# Patient Record
Sex: Female | Born: 1950 | Race: White | Hispanic: No | Marital: Married | Smoking: Former smoker
Health system: Southern US, Community
[De-identification: ages and names within clinical notes are randomized; demographics above are authoritative.]

## PROBLEM LIST (undated history)

## (undated) DIAGNOSIS — F4310 Post-traumatic stress disorder, unspecified: Secondary | ICD-10-CM

## (undated) DIAGNOSIS — IMO0001 Reserved for inherently not codable concepts without codable children: Secondary | ICD-10-CM

## (undated) DIAGNOSIS — G8929 Other chronic pain: Secondary | ICD-10-CM

## (undated) DIAGNOSIS — R030 Elevated blood-pressure reading, without diagnosis of hypertension: Secondary | ICD-10-CM

## (undated) DIAGNOSIS — F419 Anxiety disorder, unspecified: Secondary | ICD-10-CM

## (undated) DIAGNOSIS — Z8719 Personal history of other diseases of the digestive system: Secondary | ICD-10-CM

## (undated) DIAGNOSIS — F32A Depression, unspecified: Secondary | ICD-10-CM

## (undated) DIAGNOSIS — K219 Gastro-esophageal reflux disease without esophagitis: Secondary | ICD-10-CM

## (undated) DIAGNOSIS — Z9889 Other specified postprocedural states: Secondary | ICD-10-CM

## (undated) DIAGNOSIS — M1990 Unspecified osteoarthritis, unspecified site: Secondary | ICD-10-CM

## (undated) DIAGNOSIS — Z531 Procedure and treatment not carried out because of patient's decision for reasons of belief and group pressure: Secondary | ICD-10-CM

## (undated) DIAGNOSIS — G709 Myoneural disorder, unspecified: Secondary | ICD-10-CM

## (undated) HISTORY — DX: Anxiety disorder, unspecified: F41.9

## (undated) HISTORY — DX: Myoneural disorder, unspecified: G70.9

## (undated) HISTORY — PX: ABDOMINAL HYSTERECTOMY: SHX81

## (undated) HISTORY — DX: Gastro-esophageal reflux disease without esophagitis: K21.9

## (undated) HISTORY — DX: Unspecified osteoarthritis, unspecified site: M19.90

## (undated) HISTORY — PX: SPINE SURGERY: SHX786

## (undated) HISTORY — PX: TONSILLECTOMY: SUR1361

## (undated) HISTORY — PX: COLONOSCOPY: SHX174

## (undated) HISTORY — DX: Depression, unspecified: F32.A

## (undated) HISTORY — PX: CHOLECYSTECTOMY: SHX55

---
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
## 1989-12-14 HISTORY — PX: LUMBAR DISC SURGERY: SHX700

## 2018-12-09 ENCOUNTER — Encounter: Payer: Self-pay | Admitting: Family Medicine

## 2018-12-09 LAB — HM DEXA SCAN

## 2019-11-06 LAB — HM MAMMOGRAPHY

## 2019-12-06 LAB — POCT ERYTHROCYTE SEDIMENTATION RATE, NON-AUTOMATED: Sed Rate: 6

## 2020-05-21 LAB — HEPATIC FUNCTION PANEL
ALT: 22 (ref 7–35)
AST: 31 (ref 13–35)
Alkaline Phosphatase: 71 (ref 25–125)
Bilirubin, Total: 0.9

## 2020-05-21 LAB — BASIC METABOLIC PANEL
BUN: 11 (ref 4–21)
CO2: 33 — AB (ref 13–22)
Chloride: 104 (ref 99–108)
Creatinine: 0.6 (ref 0.5–1.1)
Glucose: 95
Potassium: 4.9 (ref 3.4–5.3)
Sodium: 142 (ref 137–147)

## 2020-05-21 LAB — COMPREHENSIVE METABOLIC PANEL
Albumin: 4.2 (ref 3.5–5.0)
Calcium: 9.1 (ref 8.7–10.7)
Globulin: 2.1

## 2020-05-21 LAB — CBC AND DIFFERENTIAL
HCT: 41 (ref 36–46)
Hemoglobin: 13.3 (ref 12.0–16.0)
Platelets: 222 (ref 150–399)
WBC: 3.7

## 2020-05-21 LAB — CBC: RBC: 4.51 (ref 3.87–5.11)

## 2020-05-21 LAB — MICROALBUMIN, URINE: Microalb, Ur: 0.2

## 2020-05-21 LAB — TSH: TSH: 1.81 (ref 0.41–5.90)

## 2020-05-24 LAB — LIPID PANEL
Cholesterol: 196 (ref 0–200)
HDL: 63 (ref 35–70)
LDL Cholesterol: 112
LDl/HDL Ratio: 3.1
Triglycerides: 99 (ref 40–160)

## 2020-07-22 DIAGNOSIS — M5412 Radiculopathy, cervical region: Secondary | ICD-10-CM | POA: Diagnosis not present

## 2020-07-22 DIAGNOSIS — M5416 Radiculopathy, lumbar region: Secondary | ICD-10-CM | POA: Diagnosis not present

## 2020-07-22 DIAGNOSIS — M25552 Pain in left hip: Secondary | ICD-10-CM | POA: Diagnosis not present

## 2020-07-22 DIAGNOSIS — M5417 Radiculopathy, lumbosacral region: Secondary | ICD-10-CM | POA: Diagnosis not present

## 2020-07-22 DIAGNOSIS — M9903 Segmental and somatic dysfunction of lumbar region: Secondary | ICD-10-CM | POA: Diagnosis not present

## 2020-07-22 DIAGNOSIS — M961 Postlaminectomy syndrome, not elsewhere classified: Secondary | ICD-10-CM | POA: Diagnosis not present

## 2020-07-22 DIAGNOSIS — M62838 Other muscle spasm: Secondary | ICD-10-CM | POA: Diagnosis not present

## 2020-07-22 DIAGNOSIS — M9901 Segmental and somatic dysfunction of cervical region: Secondary | ICD-10-CM | POA: Diagnosis not present

## 2020-07-22 DIAGNOSIS — M542 Cervicalgia: Secondary | ICD-10-CM | POA: Diagnosis not present

## 2020-07-22 DIAGNOSIS — M9902 Segmental and somatic dysfunction of thoracic region: Secondary | ICD-10-CM | POA: Diagnosis not present

## 2020-07-23 ENCOUNTER — Ambulatory Visit: Payer: Self-pay

## 2020-07-23 DIAGNOSIS — S52124A Nondisplaced fracture of head of right radius, initial encounter for closed fracture: Secondary | ICD-10-CM | POA: Diagnosis not present

## 2020-07-25 ENCOUNTER — Encounter: Payer: Self-pay | Admitting: Family Medicine

## 2020-07-25 ENCOUNTER — Other Ambulatory Visit: Payer: Self-pay

## 2020-07-25 ENCOUNTER — Ambulatory Visit (INDEPENDENT_AMBULATORY_CARE_PROVIDER_SITE_OTHER): Payer: Medicare HMO | Admitting: Family Medicine

## 2020-07-25 ENCOUNTER — Ambulatory Visit (INDEPENDENT_AMBULATORY_CARE_PROVIDER_SITE_OTHER)
Admission: RE | Admit: 2020-07-25 | Discharge: 2020-07-25 | Disposition: A | Discharge status: Home / Self Care | Payer: Medicare HMO | Source: Ambulatory Visit | Attending: Family Medicine | Admitting: Family Medicine

## 2020-07-25 VITALS — BP 120/78 | HR 69 | Temp 97.0°F | Ht 68.0 in | Wt 212.0 lb

## 2020-07-25 DIAGNOSIS — G894 Chronic pain syndrome: Secondary | ICD-10-CM | POA: Insufficient documentation

## 2020-07-25 DIAGNOSIS — H9190 Unspecified hearing loss, unspecified ear: Secondary | ICD-10-CM | POA: Diagnosis not present

## 2020-07-25 DIAGNOSIS — M5417 Radiculopathy, lumbosacral region: Secondary | ICD-10-CM | POA: Diagnosis not present

## 2020-07-25 DIAGNOSIS — G8929 Other chronic pain: Secondary | ICD-10-CM | POA: Diagnosis not present

## 2020-07-25 DIAGNOSIS — K5909 Other constipation: Secondary | ICD-10-CM

## 2020-07-25 DIAGNOSIS — G4700 Insomnia, unspecified: Secondary | ICD-10-CM

## 2020-07-25 DIAGNOSIS — M549 Dorsalgia, unspecified: Secondary | ICD-10-CM | POA: Insufficient documentation

## 2020-07-25 DIAGNOSIS — M542 Cervicalgia: Secondary | ICD-10-CM | POA: Insufficient documentation

## 2020-07-25 DIAGNOSIS — M0600 Rheumatoid arthritis without rheumatoid factor, unspecified site: Secondary | ICD-10-CM | POA: Diagnosis not present

## 2020-07-25 DIAGNOSIS — K5900 Constipation, unspecified: Secondary | ICD-10-CM | POA: Diagnosis not present

## 2020-07-25 DIAGNOSIS — M47816 Spondylosis without myelopathy or radiculopathy, lumbar region: Secondary | ICD-10-CM | POA: Diagnosis not present

## 2020-07-25 NOTE — Assessment & Plan Note (Signed)
Was followed by pain specialist in [place] and would like referral for continued care an management. Per report is on MS Contin and percocet. Will defer continued management to pain medicine and obtain outside records.

## 2020-07-25 NOTE — Assessment & Plan Note (Signed)
Pt notes "firm spot" which she cannot always feel. Suspect this may be stool though unable to palpate on exam. KUB with moderate stool on the right side. Hand out for constipation treatment. Will f/u final read. Consider additional imaging if persisting after constipation resolves.

## 2020-07-25 NOTE — Assessment & Plan Note (Signed)
Cont prn ambien. Gets 1 week periods and then sleeps well for several weeks.

## 2020-07-25 NOTE — Patient Instructions (Addendum)
#  Referral I have placed a referral to a specialist for you. You should receive a phone call from the specialty office. Make sure your voicemail is not full and that if you are able to answer your phone to unknown or new numbers.   It may take up to 2 weeks to hear about the referral. If you do not hear anything in 2 weeks, please call our office and ask to speak with the referral coordinator.     Constipation   Constipation is a common issue. Often it is related to diet and occasionally medications.    What you can do to treat your symptoms 1) Fiber -- Eat more fiber rich foods: beans, broccoli, berries, avocados, popcorn, pear/apple, green peas, turnip greens, brussels sprouts, whole grains (barley, bran, quinoa, oatmeal) -- Take a Fiber supplement: Psyllium (Metamucil)  -- Could also eat Prunes daily  2) Hydration  -- Drink more water: Try to drink 64 oz of water per day  3) Exercise -- Moderate exercise (walking, jogging, biking) for 30 minutes, 5 days a week  4) Dedicate time for Bowel movements - do not delay  5) Stool Softener  - Docusate Sodium (Colace) 100 mg daily or twice daily as needed   If 4-6 weeks have passed and the above has not helped then start the following 6) Laxatives -- Polyethylene Glycol (Miralax) - begin with once daily. After a few days can increase to twice daily Or -- Magnesium Citrate -- Common side effect is nausea and diarrhea -- can try if still not improved  If still not improved after 4-6 weeks 7) Stimulant Laxatives: can also be an option if doing above treatment and no bowel movement for 3 days --- Bisacodyl (Dulcolax) 5-15 mg daily --- Sennosides (Senokot)  Treating chronic constipation is often about finding the right amount of medication and fiber to keep you regular and comfortable. For some people that may be daily metamucil and colace every other day. For others it may be Metamucil and colace twice daily and Miralax 3 times a week.  The goal is to go slow and listen to your body. And normal can be anywhere from 2-3 soft bowel movements a day to 1 bowel movement every 2-3 days.

## 2020-07-25 NOTE — Progress Notes (Signed)
Subjective:     Emily Landry is a 69 y.o. female presenting for Establish Care and Referral (pain management, Gastro, rheumatologist )     HPI   #abdominal issue - chiropractor recommended her seeing GI - firm area on the abdomen - remains in the same spot - had a endoscopy - present x 1 years - no imaging  - endorses pain occasionally in the area - previously diagnosed with diverticulosis - chronic constipation    Review of Systems   Social History   Tobacco Use  Smoking Status Never Smoker  Smokeless Tobacco Never Used        Objective:    BP Readings from Last 3 Encounters:  07/25/20 120/78   Wt Readings from Last 3 Encounters:  07/25/20 212 lb (96.2 kg)    BP 120/78   Pulse 69   Temp (!) 97 F (36.1 C) (Temporal)   Ht 5\' 8"  (1.727 m)   Wt 212 lb (96.2 kg)   SpO2 95%   BMI 32.23 kg/m    Physical Exam Constitutional:      General: She is not in acute distress.    Appearance: She is well-developed. She is not diaphoretic.  HENT:     Right Ear: External ear normal.     Left Ear: External ear normal.     Nose: Nose normal.  Eyes:     Conjunctiva/sclera: Conjunctivae normal.  Cardiovascular:     Rate and Rhythm: Normal rate and regular rhythm.     Heart sounds: No murmur heard.   Pulmonary:     Effort: Pulmonary effort is normal. No respiratory distress.     Breath sounds: Normal breath sounds. No wheezing.  Abdominal:     General: Abdomen is flat. Bowel sounds are normal. There is no distension.     Palpations: There is no mass.     Tenderness: There is no abdominal tenderness. There is no guarding or rebound.     Hernia: No hernia is present.  Musculoskeletal:     Cervical back: Neck supple.     Comments: Right arm in sling  Skin:    General: Skin is warm and dry.     Capillary Refill: Capillary refill takes less than 2 seconds.  Neurological:     Mental Status: She is alert. Mental status is at baseline.  Psychiatric:         Mood and Affect: Mood normal.        Behavior: Behavior normal.     Abdominal XR: moderate stool present on the right abdomen      Assessment & Plan:   Problem List Items Addressed This Visit      Digestive   Chronic constipation    Pt notes "firm spot" which she cannot always feel. Suspect this may be stool though unable to palpate on exam. KUB with moderate stool on the right side. Hand out for constipation treatment. Will f/u final read. Consider additional imaging if persisting after constipation resolves.       Relevant Orders   DG Abd 1 View     Musculoskeletal and Integument   Seronegative rheumatoid arthritis (HCC)    Referral to Rheum for continued management. Will request records.       Relevant Medications   morphine (MS CONTIN) 15 MG 12 hr tablet   predniSONE (DELTASONE) 5 MG tablet   oxyCODONE-acetaminophen (PERCOCET) 10-325 MG tablet   Other Relevant Orders   Ambulatory referral to Rheumatology  Other   Insomnia    Cont prn ambien. Gets 1 week periods and then sleeps well for several weeks.       Hearing difficulty    Has hearing aid, but feels this is not working as well. Referral to audiology for assessment      Relevant Orders   Ambulatory referral to Audiology   Chronic pain disorder - Primary    Was followed by pain specialist in [place] and would like referral for continued care an management. Per report is on MS Contin and percocet. Will defer continued management to pain medicine and obtain outside records.       Relevant Medications   morphine (MS CONTIN) 15 MG 12 hr tablet   predniSONE (DELTASONE) 5 MG tablet   gabapentin (NEURONTIN) 100 MG capsule   oxyCODONE-acetaminophen (PERCOCET) 10-325 MG tablet   DULoxetine (CYMBALTA) 60 MG capsule   Other Relevant Orders   Ambulatory referral to Pain Clinic       Return in about 4 months (around 11/24/2020).  Lynnda Child, MD  This visit occurred during the SARS-CoV-2 public health  emergency.  Safety protocols were in place, including screening questions prior to the visit, additional usage of staff PPE, and extensive cleaning of exam room while observing appropriate contact time as indicated for disinfecting solutions.

## 2020-07-25 NOTE — Assessment & Plan Note (Signed)
Has hearing aid, but feels this is not working as well. Referral to audiology for assessment

## 2020-07-25 NOTE — Assessment & Plan Note (Signed)
Referral to Rheum for continued management. Will request records.

## 2020-07-30 ENCOUNTER — Telehealth: Payer: Self-pay | Admitting: Family Medicine

## 2020-07-30 NOTE — Telephone Encounter (Signed)
Rec'd from [facility] forwarded 99 pages to Dr. Gweneth Dimitri

## 2020-08-01 ENCOUNTER — Encounter: Payer: Self-pay | Admitting: Family Medicine

## 2020-08-01 DIAGNOSIS — M8580 Other specified disorders of bone density and structure, unspecified site: Secondary | ICD-10-CM

## 2020-08-01 DIAGNOSIS — K219 Gastro-esophageal reflux disease without esophagitis: Secondary | ICD-10-CM | POA: Insufficient documentation

## 2020-08-01 DIAGNOSIS — F325 Major depressive disorder, single episode, in full remission: Secondary | ICD-10-CM | POA: Insufficient documentation

## 2020-08-01 HISTORY — DX: Other specified disorders of bone density and structure, unspecified site: M85.80

## 2020-08-02 ENCOUNTER — Encounter: Payer: Self-pay | Admitting: Family Medicine

## 2020-08-02 DIAGNOSIS — S52124A Nondisplaced fracture of head of right radius, initial encounter for closed fracture: Secondary | ICD-10-CM | POA: Diagnosis not present

## 2020-08-05 DIAGNOSIS — M25552 Pain in left hip: Secondary | ICD-10-CM | POA: Diagnosis not present

## 2020-08-05 DIAGNOSIS — M9902 Segmental and somatic dysfunction of thoracic region: Secondary | ICD-10-CM | POA: Diagnosis not present

## 2020-08-05 DIAGNOSIS — M9901 Segmental and somatic dysfunction of cervical region: Secondary | ICD-10-CM | POA: Diagnosis not present

## 2020-08-05 DIAGNOSIS — M542 Cervicalgia: Secondary | ICD-10-CM | POA: Diagnosis not present

## 2020-08-05 DIAGNOSIS — M9903 Segmental and somatic dysfunction of lumbar region: Secondary | ICD-10-CM | POA: Diagnosis not present

## 2020-08-05 DIAGNOSIS — M62838 Other muscle spasm: Secondary | ICD-10-CM | POA: Diagnosis not present

## 2020-08-05 DIAGNOSIS — M5416 Radiculopathy, lumbar region: Secondary | ICD-10-CM | POA: Diagnosis not present

## 2020-08-06 ENCOUNTER — Encounter: Payer: Self-pay | Admitting: Family Medicine

## 2020-08-07 ENCOUNTER — Encounter: Payer: Self-pay | Admitting: Family Medicine

## 2020-08-07 DIAGNOSIS — Z03818 Encounter for observation for suspected exposure to other biological agents ruled out: Secondary | ICD-10-CM | POA: Diagnosis not present

## 2020-08-07 DIAGNOSIS — Z1152 Encounter for screening for COVID-19: Secondary | ICD-10-CM | POA: Diagnosis not present

## 2020-08-07 DIAGNOSIS — U071 COVID-19: Secondary | ICD-10-CM | POA: Diagnosis not present

## 2020-08-20 DIAGNOSIS — M5412 Radiculopathy, cervical region: Secondary | ICD-10-CM | POA: Diagnosis not present

## 2020-08-20 DIAGNOSIS — M5417 Radiculopathy, lumbosacral region: Secondary | ICD-10-CM | POA: Diagnosis not present

## 2020-08-20 DIAGNOSIS — M961 Postlaminectomy syndrome, not elsewhere classified: Secondary | ICD-10-CM | POA: Diagnosis not present

## 2020-08-26 DIAGNOSIS — H903 Sensorineural hearing loss, bilateral: Secondary | ICD-10-CM | POA: Diagnosis not present

## 2020-08-28 DIAGNOSIS — M62838 Other muscle spasm: Secondary | ICD-10-CM | POA: Diagnosis not present

## 2020-08-28 DIAGNOSIS — M5416 Radiculopathy, lumbar region: Secondary | ICD-10-CM | POA: Diagnosis not present

## 2020-08-28 DIAGNOSIS — M9902 Segmental and somatic dysfunction of thoracic region: Secondary | ICD-10-CM | POA: Diagnosis not present

## 2020-08-28 DIAGNOSIS — M542 Cervicalgia: Secondary | ICD-10-CM | POA: Diagnosis not present

## 2020-08-28 DIAGNOSIS — M25552 Pain in left hip: Secondary | ICD-10-CM | POA: Diagnosis not present

## 2020-08-28 DIAGNOSIS — M9901 Segmental and somatic dysfunction of cervical region: Secondary | ICD-10-CM | POA: Diagnosis not present

## 2020-08-28 DIAGNOSIS — M9903 Segmental and somatic dysfunction of lumbar region: Secondary | ICD-10-CM | POA: Diagnosis not present

## 2020-08-29 DIAGNOSIS — H903 Sensorineural hearing loss, bilateral: Secondary | ICD-10-CM | POA: Diagnosis not present

## 2020-09-02 DIAGNOSIS — M069 Rheumatoid arthritis, unspecified: Secondary | ICD-10-CM | POA: Diagnosis not present

## 2020-09-02 DIAGNOSIS — M19042 Primary osteoarthritis, left hand: Secondary | ICD-10-CM | POA: Diagnosis not present

## 2020-09-02 DIAGNOSIS — M19041 Primary osteoarthritis, right hand: Secondary | ICD-10-CM | POA: Insufficient documentation

## 2020-09-02 DIAGNOSIS — M19071 Primary osteoarthritis, right ankle and foot: Secondary | ICD-10-CM | POA: Diagnosis not present

## 2020-09-02 DIAGNOSIS — Z111 Encounter for screening for respiratory tuberculosis: Secondary | ICD-10-CM | POA: Diagnosis not present

## 2020-09-02 DIAGNOSIS — Z79899 Other long term (current) drug therapy: Secondary | ICD-10-CM | POA: Diagnosis not present

## 2020-09-03 ENCOUNTER — Encounter: Payer: Self-pay | Admitting: Family Medicine

## 2020-09-11 DIAGNOSIS — M9901 Segmental and somatic dysfunction of cervical region: Secondary | ICD-10-CM | POA: Diagnosis not present

## 2020-09-11 DIAGNOSIS — M9902 Segmental and somatic dysfunction of thoracic region: Secondary | ICD-10-CM | POA: Diagnosis not present

## 2020-09-11 DIAGNOSIS — M9903 Segmental and somatic dysfunction of lumbar region: Secondary | ICD-10-CM | POA: Diagnosis not present

## 2020-09-11 DIAGNOSIS — M62838 Other muscle spasm: Secondary | ICD-10-CM | POA: Diagnosis not present

## 2020-09-11 DIAGNOSIS — M5416 Radiculopathy, lumbar region: Secondary | ICD-10-CM | POA: Diagnosis not present

## 2020-09-11 DIAGNOSIS — M25552 Pain in left hip: Secondary | ICD-10-CM | POA: Diagnosis not present

## 2020-09-11 DIAGNOSIS — M542 Cervicalgia: Secondary | ICD-10-CM | POA: Diagnosis not present

## 2020-09-16 ENCOUNTER — Encounter: Payer: Self-pay | Admitting: Family Medicine

## 2020-09-16 DIAGNOSIS — G894 Chronic pain syndrome: Secondary | ICD-10-CM

## 2020-09-16 NOTE — Telephone Encounter (Signed)
Patient has appointment with her doctor in [place] tomorrow. They won't be able to see her without a referral.  She sees Dr. Armida Sans- phone number- [phone].

## 2020-09-25 DIAGNOSIS — M5416 Radiculopathy, lumbar region: Secondary | ICD-10-CM | POA: Diagnosis not present

## 2020-09-25 DIAGNOSIS — M542 Cervicalgia: Secondary | ICD-10-CM | POA: Diagnosis not present

## 2020-09-25 DIAGNOSIS — M9901 Segmental and somatic dysfunction of cervical region: Secondary | ICD-10-CM | POA: Diagnosis not present

## 2020-09-25 DIAGNOSIS — M9903 Segmental and somatic dysfunction of lumbar region: Secondary | ICD-10-CM | POA: Diagnosis not present

## 2020-09-25 DIAGNOSIS — M62838 Other muscle spasm: Secondary | ICD-10-CM | POA: Diagnosis not present

## 2020-09-25 DIAGNOSIS — M25552 Pain in left hip: Secondary | ICD-10-CM | POA: Diagnosis not present

## 2020-09-25 DIAGNOSIS — M9902 Segmental and somatic dysfunction of thoracic region: Secondary | ICD-10-CM | POA: Diagnosis not present

## 2020-10-03 ENCOUNTER — Encounter: Payer: Self-pay | Admitting: Student in an Organized Health Care Education/Training Program

## 2020-10-03 ENCOUNTER — Other Ambulatory Visit: Payer: Self-pay

## 2020-10-03 ENCOUNTER — Ambulatory Visit
Payer: Medicare HMO | Attending: Student in an Organized Health Care Education/Training Program | Admitting: Student in an Organized Health Care Education/Training Program

## 2020-10-03 VITALS — BP 159/78 | HR 63 | Temp 97.2°F | Resp 18 | Ht 69.0 in | Wt 208.0 lb

## 2020-10-03 DIAGNOSIS — M5416 Radiculopathy, lumbar region: Secondary | ICD-10-CM | POA: Diagnosis not present

## 2020-10-03 DIAGNOSIS — G894 Chronic pain syndrome: Secondary | ICD-10-CM | POA: Insufficient documentation

## 2020-10-03 DIAGNOSIS — M25511 Pain in right shoulder: Secondary | ICD-10-CM | POA: Diagnosis not present

## 2020-10-03 DIAGNOSIS — M25512 Pain in left shoulder: Secondary | ICD-10-CM | POA: Insufficient documentation

## 2020-10-03 DIAGNOSIS — M5136 Other intervertebral disc degeneration, lumbar region: Secondary | ICD-10-CM | POA: Insufficient documentation

## 2020-10-03 DIAGNOSIS — G8929 Other chronic pain: Secondary | ICD-10-CM | POA: Insufficient documentation

## 2020-10-03 DIAGNOSIS — M961 Postlaminectomy syndrome, not elsewhere classified: Secondary | ICD-10-CM | POA: Diagnosis not present

## 2020-10-03 DIAGNOSIS — Z79891 Long term (current) use of opiate analgesic: Secondary | ICD-10-CM | POA: Insufficient documentation

## 2020-10-03 NOTE — Addendum Note (Signed)
Addended by: Edward Jolly on: 10/03/2020 03:04 PM   Modules accepted: Orders

## 2020-10-03 NOTE — Progress Notes (Signed)
Safety precautions to be maintained throughout the outpatient stay will include: orient to surroundings, keep bed in low position, maintain call bell within reach at all times, provide assistance with transfer out of bed and ambulation.  

## 2020-10-03 NOTE — Progress Notes (Signed)
Patient: Emily Landry  Service Category: E/M  Provider: Gillis Santa, MD  DOB: 1950-12-19  DOS: 10/03/2020  Referring Provider: Lesleigh Noe, MD  MRN: [id number]  Setting: Ambulatory outpatient  PCP: Lesleigh Noe, MD  Type: New Patient  Specialty: Interventional Pain Management    Location: Office  Delivery: Face-to-face     Primary Reason(s) for Visit: Encounter for initial evaluation of one or more chronic problems (new to examiner) potentially causing chronic pain, and posing a threat to normal musculoskeletal function. (Level of risk: High) CC: Back Pain (low back, coccyx) and Shoulder Pain (bilateral, right is worse)  HPI  Emily Landry is a 69 y.o. year old, female patient, who comes for the first time to our practice referred by Lesleigh Noe, MD for our initial evaluation of her chronic pain. She has Seronegative rheumatoid arthritis ([place]); Insomnia; Hearing difficulty; Chronic pain syndrome; Chronic back pain; Chronic neck pain; Chronic constipation; Osteopenia; Major depressive disorder, single episode, in remission ([place]); GERD (gastroesophageal reflux disease); Lumbar post-laminectomy syndrome; Lumbar radiculopathy; Lumbar degenerative disc disease; and Encounter for long-term opiate analgesic use on their problem list. Today she comes in for evaluation of her Back Pain (low back, coccyx) and Shoulder Pain (bilateral, right is worse)  Pain Assessment: Location: Lower Back Radiating: radiates into left leg to heel in side and front Onset: More than a month ago Duration: Chronic pain Quality: Dull, Throbbing Severity: 6 /10 (subjective, self-reported pain score)  Effect on ADL: Limits activities Timing: Constant Modifying factors: lying down BP: (!) 159/78  HR: 63  Onset and Duration: Present longer than 3 months Cause of pain: Unknown Severity: NAS-11 at its worse: 9/10, NAS-11 at its best: 6/10, NAS-11 now: 6/10 and NAS-11 on the average: 7/10 Timing: Morning, Night and  After a period of immobility Aggravating Factors: Prolonged sitting, Prolonged standing, Squatting and Stooping  Alleviating Factors: Acupuncture, Stretching, Cold packs, Hot packs, Lying down, Medications, Resting, Sleeping and Chiropractic manipulations Associated Problems: Constipation, Night-time cramps, Depression, Fatigue, Numbness, Weakness, Pain that wakes patient up and Pain that does not allow patient to sleep Quality of Pain: Aching, Burning and Exhausting Previous Examinations or Tests: Ct-Myelogram, MRI scan, Myelogram, Nerve block, X-rays, Nerve conduction test, Orthopedic evaluation and Chiropractic evaluation Previous Treatments: Chiropractic manipulations, Epidural steroid injections, Facet blocks, Narcotic medications, Physical Therapy, Steroid treatments by mouth, Stretching exercises and TENS  Emily Landry is a pleasant 69 year old female who recently moved from [place] with a history of chronic low back pain as well as bilateral shoulder pain, right greater than left.  Of note, patient has a history of 2 lumbar spinal surgeries.  She fell at the age of 89 and fractured her coccyx and had her first surgery 1970 and her second surgery 29.  Of note she also has bilateral shoulder pain with the right shoulder being worse.  She has done physical therapy and chiropractic adjustment for this.  She was recently diagnosed with rheumatoid arthritis and feels that many of her arthralgias are related to this diagnosis of RA.  She is on gabapentin for lower extremity neuropathic pain.  She was established with pain management in [place] and is currently on morphine 15 mg 3 times daily and Percocet 10 mg 3 times daily as needed.  She is also on Cymbalta and Ambien for insomnia.  She takes prednisone daily.  Of note she has tried lumbar epidural steroid injections in the past which she states were not helpful.  She is hoping to establish care and  have her chronic opioid analgesics continued  here.  Historic Controlled Substance Pharmacotherapy Review  PMP and historical list of controlled substances:  Morphine 15 mg 3 times daily, Percocet 10 mg 3 times daily as needed for breakthrough pain, MME equals 90  Historical Monitoring: The patient  reports no history of drug use. List of all UDS Test(s): No results found for: MDMA, COCAINSCRNUR, [place], [place], CANNABQUANT, THCU, [place] List of other Serum/Urine Drug Screening Test(s):  No results found for: AMPHSCRSER, BARBSCRSER, BENZOSCRSER, COCAINSCRSER, COCAINSCRNUR, PCPSCRSER, PCPQUANT, THCSCRSER, THCU, CANNABQUANT, OPIATESCRSER, OXYSCRSER, PROPOXSCRSER, ETH Historical Background Evaluation: [place] PMP: PDMP reviewed during this encounter. Online review of the past 84-monthperiod conducted.              Department of public safety, offender search: (Editor, commissioningInformation) Non-contributory Risk Assessment Profile: Aberrant behavior: None observed or detected today Risk factors for fatal opioid overdose: None identified today Fatal overdose hazard ratio (HR): 1.92 for 50-99 MME/day Non-fatal overdose hazard ratio (HR): Calculation deferred Risk of opioid abuse or dependence: 0.7-3.0% with doses ? 36 MME/day and 6.1-26% with doses ? 120 MME/day. Substance use disorder (SUD) risk level: See below Personal History of Substance Abuse (SUD-Substance use disorder):  Alcohol: Negative  Illegal Drugs: Negative  Rx Drugs: Negative  ORT Risk Level calculation: Low Risk  Opioid Risk Tool - 10/03/20 0933      Family History of Substance Abuse   Alcohol Negative    Illegal Drugs Negative    Rx Drugs Negative      Personal History of Substance Abuse   Alcohol Negative    Illegal Drugs Negative    Rx Drugs Negative      Age   Age between 18-45years  No      History of Preadolescent Sexual Abuse   History of Preadolescent Sexual Abuse Negative or Female      Psychological Disease   Psychological Disease Negative    Depression  Positive      Total Score   Opioid Risk Tool Scoring 1    Opioid Risk Interpretation Low Risk          ORT Scoring interpretation table:  Score <3 = Low Risk for SUD  Score between 4-7 = Moderate Risk for SUD  Score >8 = High Risk for Opioid Abuse   PHQ-2 Depression Scale:  Total score: 0  PHQ-2 Scoring interpretation table: (Score and probability of major depressive disorder)  Score 0 = No depression  Score 1 = 15.4% Probability  Score 2 = 21.1% Probability  Score 3 = 38.4% Probability  Score 4 = 45.5% Probability  Score 5 = 56.4% Probability  Score 6 = 78.6% Probability   PHQ-9 Depression Scale:  Total score: 0  PHQ-9 Scoring interpretation table:  Score 0-4 = No depression  Score 5-9 = Mild depression  Score 10-14 = Moderate depression  Score 15-19 = Moderately severe depression  Score 20-27 = Severe depression (2.4 times higher risk of SUD and 2.89 times higher risk of overuse)   Pharmacologic Plan: As per protocol, I have not taken over any controlled substance management, pending the results of ordered tests and/or consults.            Initial impression: Pending review of available data and ordered tests.  Meds   Current Outpatient Medications:  .  DULoxetine (CYMBALTA) 60 MG capsule, Take 60 mg by mouth daily., Disp: , Rfl:  .  folic acid (FOLVITE) 1 MG tablet, , Disp: , Rfl:  .  gabapentin (NEURONTIN) 100 MG capsule, Take 100 mg by mouth 3 (three) times daily., Disp: , Rfl:  .  methotrexate (RHEUMATREX) 2.5 MG tablet, Take by mouth., Disp: , Rfl:  .  morphine (MS CONTIN) 15 MG 12 hr tablet, Take 15 mg by mouth 3 (three) times daily as needed for pain., Disp: , Rfl:  .  oxyCODONE-acetaminophen (PERCOCET) 10-325 MG tablet, Take 1 tablet by mouth every 8 (eight) hours as needed for pain., Disp: , Rfl:  .  predniSONE (DELTASONE) 5 MG tablet, Take 5 mg by mouth daily with breakfast. , Disp: , Rfl:  .  triamcinolone cream (KENALOG) 0.1 %, , Disp: , Rfl:  .  zolpidem  (AMBIEN) 5 MG tablet, Take 5 mg by mouth at bedtime as needed for sleep., Disp: , Rfl:    ROS  Cardiovascular: No reported cardiovascular signs or symptoms such as High blood pressure, coronary artery disease, abnormal heart rate or rhythm, heart attack, blood thinner therapy or heart weakness and/or failure Pulmonary or Respiratory: No reported pulmonary signs or symptoms such as wheezing and difficulty taking a deep full breath (Asthma), difficulty blowing air out (Emphysema), coughing up mucus (Bronchitis), persistent dry cough, or temporary stoppage of breathing during sleep Neurological: Abnormal skin sensations (Peripheral Neuropathy) Psychological-Psychiatric: No reported psychological or psychiatric signs or symptoms such as difficulty sleeping, anxiety, depression, delusions or hallucinations (schizophrenial), mood swings (bipolar disorders) or suicidal ideations or attempts Gastrointestinal: No reported gastrointestinal signs or symptoms such as vomiting or evacuating blood, reflux, heartburn, alternating episodes of diarrhea and constipation, inflamed or scarred liver, or pancreas or irrregular and/or infrequent bowel movements Genitourinary: No reported renal or genitourinary signs or symptoms such as difficulty voiding or producing urine, peeing blood, non-functioning kidney, kidney stones, difficulty emptying the bladder, difficulty controlling the flow of urine, or chronic kidney disease Hematological: No reported hematological signs or symptoms such as prolonged bleeding, low or poor functioning platelets, bruising or bleeding easily, hereditary bleeding problems, low energy levels due to low hemoglobin or being anemic Endocrine: No reported endocrine signs or symptoms such as high or low blood sugar, rapid heart rate due to high thyroid levels, obesity or weight gain due to slow thyroid or thyroid disease Rheumatologic: No reported rheumatological signs and symptoms such as fatigue,  joint pain, tenderness, swelling, redness, heat, stiffness, decreased range of motion, with or without associated rash Musculoskeletal: Negative for myasthenia gravis, muscular dystrophy, multiple sclerosis or malignant hyperthermia Work History: Out of work due to pain  Allergies  Ms. Dlugosz has no allergies on file.  Laboratory Chemistry Profile   Renal No results found for: BUN, CREATININE, LABCREA, BCR, GFR, GFRAA, GFRNONAA, SPECGRAV, PHUR, PROTEINUR   Electrolytes No results found for: NA, K, CL, CALCIUM, MG, PHOS   Hepatic No results found for: AST, ALT, ALBUMIN, ALKPHOS, AMYLASE, LIPASE, AMMONIA   ID No results found for: LYMEIGGIGMAB, HIV, SARSCOV2NAA, STAPHAUREUS, MRSAPCR, HCVAB, PREGTESTUR, RMSFIGG, QFVRPH1IGG, QFVRPH2IGG, LYMEIGGIGMAB   Bone No results found for: VD25OH, [id number], [id number], [id number], 25OHVITD1, 25OHVITD2, 25OHVITD3, TESTOFREE, TESTOSTERONE   Endocrine No results found for: GLUCOSE, GLUCOSEU, HGBA1C, TSH, FREET4, TESTOFREE, TESTOSTERONE, SHBG, ESTRADIOL, ESTRADIOLPCT, ESTRADIOLFRE, LABPREG, ACTH, CRTSLPL, UCORFRPERLTR, UCORFRPERDAY, CORTISOLBASE, LABPREG   Neuropathy No results found for: VITAMINB12, FOLATE, HGBA1C, HIV   CNS No results found for: COLORCSF, APPEARCSF, RBCCOUNTCSF, WBCCSF, POLYSCSF, LYMPHSCSF, EOSCSF, PROTEINCSF, GLUCCSF, JCVIRUS, CSFOLI, IGGCSF, LABACHR, ACETBL, LABACHR, ACETBL   Inflammation (CRP: Acute  ESR: Chronic) No results found for: CRP, ESRSEDRATE, LATICACIDVEN   Rheumatology No results found for:  RF, ANA, LABURIC, URICUR, LYMEIGGIGMAB, LYMEABIGMQN, HLAB27   Coagulation No results found for: INR, LABPROT, APTT, PLT, DDIMER, LABHEMA, VITAMINK1, AT3   Cardiovascular No results found for: BNP, CKTOTAL, CKMB, TROPONINI, HGB, HCT, LABVMA, EPIRU, EPINEPH24HUR, NOREPRU, NOREPI24HUR, DOPARU, DOPAM24HRUR   Screening No results found for: SARSCOV2NAA, COVIDSOURCE, STAPHAUREUS, MRSAPCR, HCVAB, HIV, PREGTESTUR   Cancer No  results found for: CEA, CA125, LABCA2   Allergens No results found for: ALMOND, APPLE, ASPARAGUS, AVOCADO, BANANA, BARLEY, BASIL, BAYLE[place], GREENBEAN, LIMABEAN, WHITEBEAN, BEEFIGE, REDBEET, BLUEBERRY, BROCCOLI, CABBAGE, MELON, CARROT, CASEIN, CASHEWNUT, CAULIFLOWER, CELERY     Note: Lab results reviewed.  PFSH  Drug: Ms. Artley  reports no history of drug use. Alcohol:  reports current alcohol use. Tobacco:  reports that she has never smoked. She has never used smokeless tobacco. Medical:  has a past medical history of Anxiety, Arthritis, Depression, and GERD (gastroesophageal reflux disease). Family: family history includes Alcohol abuse in her father; Arthritis in her father; Breast cancer (age of onset: 104) in her mother; Diabetes in her mother; Gout in her father; Rheum arthritis in her mother.  Past Surgical History:  Procedure Laterality Date  . ABDOMINAL HYSTERECTOMY     no longer has cervix  . CHOLECYSTECTOMY    . LAMINECTOMY  1971  . [place] SURGERY  1991  . TONSILLECTOMY     Active Ambulatory Problems    Diagnosis Date Noted  . Seronegative rheumatoid arthritis ([place]) 07/25/2020  . Insomnia 07/25/2020  . Hearing difficulty 07/25/2020  . Chronic pain syndrome 07/25/2020  . Chronic back pain 07/25/2020  . Chronic neck pain 07/25/2020  . Chronic constipation 07/25/2020  . Osteopenia 08/01/2020  . Major depressive disorder, single episode, in remission ([place]) 08/01/2020  . GERD (gastroesophageal reflux disease) 08/01/2020  . Lumbar post-laminectomy syndrome 10/03/2020  . Lumbar radiculopathy 10/03/2020  . Lumbar degenerative disc disease 10/03/2020  . Encounter for long-term opiate analgesic use 10/03/2020   Resolved Ambulatory Problems    Diagnosis Date Noted  . No Resolved Ambulatory Problems   Past Medical History:  Diagnosis Date  . Anxiety   . Arthritis   . Depression    Constitutional Exam  General appearance: Well nourished, well developed, and well  hydrated. In no apparent acute distress Vitals:   10/03/20 0925  BP: (!) 159/78  Pulse: 63  Resp: 18  Temp: (!) 97.2 F (36.2 C)  SpO2: 98%  Weight: 208 lb (94.3 kg)  Height: _0  (1.753 m)   BMI Assessment: Estimated body mass index is 30.72 kg/m as calculated from the following:   Height as of this encounter: _1  (1.753 m).   Weight as of this encounter: 208 lb (94.3 kg).  BMI interpretation table: BMI level Category Range association with higher incidence of chronic pain  <18 kg/m2 Underweight   18.5-24.9 kg/m2 Ideal body weight   25-29.9 kg/m2 Overweight Increased incidence by 20%  30-34.9 kg/m2 Obese (Class I) Increased incidence by 68%  35-39.9 kg/m2 Severe obesity (Class II) Increased incidence by 136%  >40 kg/m2 Extreme obesity (Class III) Increased incidence by 254%   Patient's current BMI Ideal Body weight  Body mass index is 30.72 kg/m. Ideal body weight: 66.2 kg (145 lb 15.1 oz) Adjusted ideal body weight: 77.5 kg (170 lb 12.3 oz)   BMI Readings from Last 4 Encounters:  10/03/20 30.72 kg/m  07/25/20 32.23 kg/m   Wt Readings from Last 4 Encounters:  10/03/20 208 lb (94.3 kg)  07/25/20 212 lb (96.2 kg)  Psych/Mental status: Alert, oriented x 3 (person, place, & time)       Eyes: PERLA Respiratory: No evidence of acute respiratory distress  Cervical Spine Exam  Skin & Axial Inspection: No masses, redness, edema, swelling, or associated skin lesions Alignment: Symmetrical Functional ROM: Unrestricted ROM      Stability: No instability detected Muscle Tone/Strength: Functionally intact. No obvious neuro-muscular anomalies detected. Sensory (Neurological): Unimpaired Palpation: No palpable anomalies              Upper Extremity (UE) Exam    Side: Right upper extremity  Side: Left upper extremity  Skin & Extremity Inspection: Skin color, temperature, and hair growth are WNL. No peripheral edema or cyanosis. No masses, redness, swelling, asymmetry, or  associated skin lesions. No contractures.  Skin & Extremity Inspection: Skin color, temperature, and hair growth are WNL. No peripheral edema or cyanosis. No masses, redness, swelling, asymmetry, or associated skin lesions. No contractures.  Functional ROM: Pain restricted ROM for shoulder  Functional ROM: Pain restricted ROM for shoulder  Muscle Tone/Strength: Functionally intact. No obvious neuro-muscular anomalies detected.   Muscle Tone/Strength: Functionally intact. No obvious neuro-muscular anomalies detected.  Sensory (Neurological): Arthropathic arthralgia          Sensory (Neurological): Arthropathic arthralgia          Palpation: No palpable anomalies              Palpation: No palpable anomalies              Provocative Test(s):  Phalen's test: deferred Tinel's test: deferred Apley's scratch test (touch opposite shoulder):  Action 1 (Across chest): deferred Action 2 (Overhead): deferred Action 3 (LB reach): deferred   Provocative Test(s):  Phalen's test: deferred Tinel's test: deferred Apley's scratch test (touch opposite shoulder):  Action 1 (Across chest): deferred Action 2 (Overhead): deferred Action 3 (LB reach): deferred    Thoracic Spine Area Exam  Skin & Axial Inspection: No masses, redness, or swelling Alignment: Symmetrical Functional ROM: Pain restricted ROM Stability: No instability detected Muscle Tone/Strength: Functionally intact. No obvious neuro-muscular anomalies detected. Sensory (Neurological): Unimpaired Muscle strength & Tone: No palpable anomalies  Lumbar Exam  Skin & Axial Inspection: Well healed scar from previous spine surgery detected Alignment: Symmetrical Functional ROM: Pain restricted ROM affecting both sides Stability: No instability detected Muscle Tone/Strength: Functionally intact. No obvious neuro-muscular anomalies detected. Sensory (Neurological): Dermatomal pain pattern Palpation: No palpable anomalies       Provocative  Tests: Hyperextension/rotation test: deferred today       Lumbar quadrant test (Kemp's test): deferred today       Lateral bending test: (+) ipsilateral radicular pain, bilaterally. Positive for bilateral foraminal stenosis. Patrick's Maneuver: (+) due to pain             FABER* test: deferred today                   S-I anterior distraction/compression test: deferred today         S-I lateral compression test: deferred today         S-I Thigh-thrust test: deferred today         S-I Gaenslen's test: deferred today         *(Flexion, ABduction and External Rotation)  Gait & Posture Assessment  Ambulation: Unassisted Gait: Relatively normal for age and body habitus Posture: WNL   Lower Extremity Exam    Side: Right lower extremity  Side: Left lower extremity  Stability: No instability  observed          Stability: No instability observed          Skin & Extremity Inspection: Skin color, temperature, and hair growth are WNL. No peripheral edema or cyanosis. No masses, redness, swelling, asymmetry, or associated skin lesions. No contractures.  Skin & Extremity Inspection: Skin color, temperature, and hair growth are WNL. No peripheral edema or cyanosis. No masses, redness, swelling, asymmetry, or associated skin lesions. No contractures.  Functional ROM: Unrestricted ROM                  Functional ROM: Unrestricted ROM                  Muscle Tone/Strength: Functionally intact. No obvious neuro-muscular anomalies detected.  Muscle Tone/Strength: Functionally intact. No obvious neuro-muscular anomalies detected.  Sensory (Neurological): Neuropathic pain pattern        Sensory (Neurological): Neuropathic pain pattern        DTR: Patellar: deferred today Achilles: deferred today Plantar: deferred today  DTR: Patellar: deferred today Achilles: deferred today Plantar: deferred today  Palpation: No palpable anomalies  Palpation: No palpable anomalies   Assessment  Primary Diagnosis &  Pertinent Problem List: The primary encounter diagnosis was Failed back surgical syndrome. Diagnoses of Lumbar post-laminectomy syndrome, Chronic pain of both shoulders, Chronic radicular lumbar pain, Lumbar radiculopathy, Lumbar degenerative disc disease, Encounter for long-term opiate analgesic use, Chronic pain syndrome, and Other intervertebral disc degeneration, lumbar region were also pertinent to this visit.  Visit Diagnosis (New problems to examiner): 1. Failed back surgical syndrome   2. Lumbar post-laminectomy syndrome   3. Chronic pain of both shoulders   4. Chronic radicular lumbar pain   5. Lumbar radiculopathy   6. Lumbar degenerative disc disease   7. Encounter for long-term opiate analgesic use   8. Chronic pain syndrome   9. Other intervertebral disc degeneration, lumbar region    Plan of Care (Initial workup plan)  Note: Ms. Heckel was reminded that as per protocol, today's visit has been an evaluation only. We have not taken over the patient's controlled substance management.  I had an extensive discussion with the patient regarding treatment options.  I expressed to her that our clinic policy for opioid analgesics is 60 MME.  Patient's current medication regimen of MS Contin 15 mg 3 times daily (45 MME), Percocet 10 mg 3 times daily as needed for breakthrough pain (45 MME) for a total daily dose of 90 MME exceeds our clinic policy of 60 MME as the upper limit for nonmalignant pain.  Patient has not had any recent imaging of her thoracic and lumbar spine.  She has symptoms consistent with chronic lumbar radicular pain, lumbar postlaminectomy pain syndrome.  I discussed with her potential advanced therapies such as spinal cord stimulation for her low back and leg pain.  These may limit her utilization of opioid analgesics.  I informed the patient that if she would like to continue care here as she will need to wean her MS Contin to 15 mg twice daily and her Percocet to 10 mg twice  daily.  I will order thoracic and lumbar MRI of her spine and after review of her imaging studies, I can discuss treatment plan with the patient.  Patient instructed to follow-up for her second patient visit after she has completed thoracic and lumbar MRI.   Imaging Orders     MR THORACIC SPINE WO CONTRAST     MR LUMBAR SPINE  WO CONTRAST  Pharmacological management options:  Opioid Analgesics: The patient was informed that there is no guarantee that she would be a candidate for opioid analgesics. The decision will be made following CDC guidelines. This decision will be based on the results of diagnostic studies, as well as Ms. Zabriskie's risk profile.   Membrane stabilizer: To be determined at a later time  Currently on gabapentin 100 mg 3 times daily  Muscle relaxant: To be determined at a later time  NSAID: To be determined at a later time  Other analgesic(s): Also on Cymbalta   Interventional management options: Ms. Rasool was informed that there is no guarantee that she would be a candidate for interventional therapies. The decision will be based on the results of diagnostic studies, as well as Ms. Blash's risk profile.  Procedure(s) under consideration:  Pending thoracic and lumbar MRI.  Consider spinal cord stimulator trial.   Provider-requested follow-up: Return for After Imaging.  No future appointments.  Note by: Gillis Santa, MD Date: 10/03/2020; Time: 11:48 AM

## 2020-10-09 ENCOUNTER — Encounter: Payer: Self-pay | Admitting: Family Medicine

## 2020-10-09 DIAGNOSIS — M5416 Radiculopathy, lumbar region: Secondary | ICD-10-CM | POA: Diagnosis not present

## 2020-10-09 DIAGNOSIS — M25552 Pain in left hip: Secondary | ICD-10-CM | POA: Diagnosis not present

## 2020-10-09 DIAGNOSIS — M542 Cervicalgia: Secondary | ICD-10-CM | POA: Diagnosis not present

## 2020-10-09 DIAGNOSIS — M62838 Other muscle spasm: Secondary | ICD-10-CM | POA: Diagnosis not present

## 2020-10-09 DIAGNOSIS — M9903 Segmental and somatic dysfunction of lumbar region: Secondary | ICD-10-CM | POA: Diagnosis not present

## 2020-10-09 DIAGNOSIS — M9901 Segmental and somatic dysfunction of cervical region: Secondary | ICD-10-CM | POA: Diagnosis not present

## 2020-10-09 DIAGNOSIS — M9902 Segmental and somatic dysfunction of thoracic region: Secondary | ICD-10-CM | POA: Diagnosis not present

## 2020-10-09 MED ORDER — DULOXETINE HCL 60 MG PO CPEP
60.0000 mg | ORAL_CAPSULE | Freq: Every day | ORAL | 3 refills | Status: DC
Start: 2020-10-09 — End: 2021-02-03

## 2020-10-10 LAB — COMPLIANCE DRUG ANALYSIS, UR

## 2020-10-14 ENCOUNTER — Telehealth: Payer: Self-pay | Admitting: Family Medicine

## 2020-10-14 NOTE — Telephone Encounter (Signed)
Referral sheet was faxed back to Cassie at [facility] in [facility], stating that the pt has now established with pain medicine here in [place] with Dr. Cherylann Ratel. Per previous messages in pt's chart on 09/16/20 , Dr.Cody was only willing to send one referral to [place] pain, until pt was established with pain provider here in [place]. Pt established with Dr. Cherylann Ratel on 10/03/20.

## 2020-10-14 NOTE — Telephone Encounter (Signed)
See saw Dr. Cherylann Ratel with pain management on 10/03/2020. Would recommend she continue care with his clinic and stop getting care out of state in [place].   If the PA is for Dr. Garnett Farm office or another [place] provider then I can complete.

## 2020-10-14 NOTE — Telephone Encounter (Signed)
cassie  called checking on prior auth Pt insurance needs this before pt can be seen  She faxed paperwork 10/04/20  Pt has appointment tomorrow 11/2 @ 9:15

## 2020-10-14 NOTE — Telephone Encounter (Signed)
Christy/Dr Selena Batten, do you have a form? I have not received anything from the insurance. Usually the patient has to request this form and then we do the "referral auth"

## 2020-10-15 DIAGNOSIS — M5417 Radiculopathy, lumbosacral region: Secondary | ICD-10-CM | POA: Diagnosis not present

## 2020-10-15 DIAGNOSIS — M961 Postlaminectomy syndrome, not elsewhere classified: Secondary | ICD-10-CM | POA: Diagnosis not present

## 2020-10-15 DIAGNOSIS — M5412 Radiculopathy, cervical region: Secondary | ICD-10-CM | POA: Diagnosis not present

## 2020-10-17 DIAGNOSIS — Z79899 Other long term (current) drug therapy: Secondary | ICD-10-CM | POA: Diagnosis not present

## 2020-10-17 DIAGNOSIS — M19042 Primary osteoarthritis, left hand: Secondary | ICD-10-CM | POA: Diagnosis not present

## 2020-10-17 DIAGNOSIS — M19041 Primary osteoarthritis, right hand: Secondary | ICD-10-CM | POA: Diagnosis not present

## 2020-10-17 DIAGNOSIS — M069 Rheumatoid arthritis, unspecified: Secondary | ICD-10-CM | POA: Diagnosis not present

## 2020-10-23 DIAGNOSIS — M9903 Segmental and somatic dysfunction of lumbar region: Secondary | ICD-10-CM | POA: Diagnosis not present

## 2020-10-23 DIAGNOSIS — M5416 Radiculopathy, lumbar region: Secondary | ICD-10-CM | POA: Diagnosis not present

## 2020-10-23 DIAGNOSIS — M25552 Pain in left hip: Secondary | ICD-10-CM | POA: Diagnosis not present

## 2020-10-23 DIAGNOSIS — M542 Cervicalgia: Secondary | ICD-10-CM | POA: Diagnosis not present

## 2020-10-23 DIAGNOSIS — M9901 Segmental and somatic dysfunction of cervical region: Secondary | ICD-10-CM | POA: Diagnosis not present

## 2020-10-23 DIAGNOSIS — M9902 Segmental and somatic dysfunction of thoracic region: Secondary | ICD-10-CM | POA: Diagnosis not present

## 2020-10-23 DIAGNOSIS — M62838 Other muscle spasm: Secondary | ICD-10-CM | POA: Diagnosis not present

## 2020-10-26 ENCOUNTER — Ambulatory Visit: Payer: Medicare HMO

## 2020-10-26 ENCOUNTER — Other Ambulatory Visit: Payer: Medicare HMO

## 2020-10-31 ENCOUNTER — Other Ambulatory Visit: Payer: Self-pay | Admitting: Student in an Organized Health Care Education/Training Program

## 2020-10-31 ENCOUNTER — Encounter: Payer: Self-pay | Admitting: Student in an Organized Health Care Education/Training Program

## 2020-10-31 DIAGNOSIS — G894 Chronic pain syndrome: Secondary | ICD-10-CM

## 2020-10-31 DIAGNOSIS — M961 Postlaminectomy syndrome, not elsewhere classified: Secondary | ICD-10-CM

## 2020-10-31 DIAGNOSIS — G8929 Other chronic pain: Secondary | ICD-10-CM

## 2020-10-31 MED ORDER — DIAZEPAM 5 MG PO TABS: 5.0000 mg | ORAL_TABLET | Freq: Once | ORAL | 0 refills | Status: DC | PRN

## 2020-11-02 ENCOUNTER — Other Ambulatory Visit: Payer: Self-pay

## 2020-11-02 ENCOUNTER — Ambulatory Visit
Admission: RE | Admit: 2020-11-02 | Discharge: 2020-11-02 | Disposition: A | Discharge status: Home / Self Care | Payer: Medicare PPO | Source: Ambulatory Visit | Attending: Student in an Organized Health Care Education/Training Program | Admitting: Student in an Organized Health Care Education/Training Program

## 2020-11-02 DIAGNOSIS — G894 Chronic pain syndrome: Secondary | ICD-10-CM

## 2020-11-02 DIAGNOSIS — M546 Pain in thoracic spine: Secondary | ICD-10-CM | POA: Diagnosis not present

## 2020-11-02 DIAGNOSIS — M51369 Other intervertebral disc degeneration, lumbar region without mention of lumbar back pain or lower extremity pain: Secondary | ICD-10-CM

## 2020-11-02 DIAGNOSIS — G8929 Other chronic pain: Secondary | ICD-10-CM

## 2020-11-02 DIAGNOSIS — M961 Postlaminectomy syndrome, not elsewhere classified: Secondary | ICD-10-CM

## 2020-11-02 DIAGNOSIS — M5416 Radiculopathy, lumbar region: Secondary | ICD-10-CM

## 2020-11-02 DIAGNOSIS — M545 Low back pain, unspecified: Secondary | ICD-10-CM | POA: Diagnosis not present

## 2020-11-02 DIAGNOSIS — M5136 Other intervertebral disc degeneration, lumbar region: Secondary | ICD-10-CM | POA: Insufficient documentation

## 2020-11-04 DIAGNOSIS — M9901 Segmental and somatic dysfunction of cervical region: Secondary | ICD-10-CM | POA: Diagnosis not present

## 2020-11-04 DIAGNOSIS — M9902 Segmental and somatic dysfunction of thoracic region: Secondary | ICD-10-CM | POA: Diagnosis not present

## 2020-11-04 DIAGNOSIS — M25552 Pain in left hip: Secondary | ICD-10-CM | POA: Diagnosis not present

## 2020-11-04 DIAGNOSIS — M62838 Other muscle spasm: Secondary | ICD-10-CM | POA: Diagnosis not present

## 2020-11-04 DIAGNOSIS — M542 Cervicalgia: Secondary | ICD-10-CM | POA: Diagnosis not present

## 2020-11-04 DIAGNOSIS — M5416 Radiculopathy, lumbar region: Secondary | ICD-10-CM | POA: Diagnosis not present

## 2020-11-04 DIAGNOSIS — M9903 Segmental and somatic dysfunction of lumbar region: Secondary | ICD-10-CM | POA: Diagnosis not present

## 2020-11-12 DIAGNOSIS — M5417 Radiculopathy, lumbosacral region: Secondary | ICD-10-CM | POA: Diagnosis not present

## 2020-11-12 DIAGNOSIS — M961 Postlaminectomy syndrome, not elsewhere classified: Secondary | ICD-10-CM | POA: Diagnosis not present

## 2020-11-12 DIAGNOSIS — M5412 Radiculopathy, cervical region: Secondary | ICD-10-CM | POA: Diagnosis not present

## 2020-11-16 ENCOUNTER — Encounter: Payer: Self-pay | Admitting: Family Medicine

## 2020-11-18 DIAGNOSIS — M9902 Segmental and somatic dysfunction of thoracic region: Secondary | ICD-10-CM | POA: Diagnosis not present

## 2020-11-18 DIAGNOSIS — M62838 Other muscle spasm: Secondary | ICD-10-CM | POA: Diagnosis not present

## 2020-11-18 DIAGNOSIS — M9901 Segmental and somatic dysfunction of cervical region: Secondary | ICD-10-CM | POA: Diagnosis not present

## 2020-11-18 DIAGNOSIS — M542 Cervicalgia: Secondary | ICD-10-CM | POA: Diagnosis not present

## 2020-11-18 DIAGNOSIS — M9903 Segmental and somatic dysfunction of lumbar region: Secondary | ICD-10-CM | POA: Diagnosis not present

## 2020-11-18 DIAGNOSIS — M25552 Pain in left hip: Secondary | ICD-10-CM | POA: Diagnosis not present

## 2020-11-18 DIAGNOSIS — M5416 Radiculopathy, lumbar region: Secondary | ICD-10-CM | POA: Diagnosis not present

## 2020-11-21 ENCOUNTER — Encounter: Payer: Self-pay | Admitting: Student in an Organized Health Care Education/Training Program

## 2020-12-01 ENCOUNTER — Encounter (INDEPENDENT_AMBULATORY_CARE_PROVIDER_SITE_OTHER): Payer: Self-pay

## 2020-12-02 ENCOUNTER — Ambulatory Visit: Payer: Medicare PPO | Admitting: Family Medicine

## 2020-12-02 ENCOUNTER — Other Ambulatory Visit: Payer: Self-pay

## 2020-12-02 ENCOUNTER — Encounter: Payer: Self-pay | Admitting: Family Medicine

## 2020-12-02 ENCOUNTER — Telehealth: Payer: Self-pay

## 2020-12-02 VITALS — BP 120/76 | HR 60 | Temp 97.2°F | Ht 69.0 in | Wt 216.8 lb

## 2020-12-02 DIAGNOSIS — M25552 Pain in left hip: Secondary | ICD-10-CM | POA: Diagnosis not present

## 2020-12-02 DIAGNOSIS — Z86010 Personal history of colonic polyps: Secondary | ICD-10-CM

## 2020-12-02 DIAGNOSIS — M542 Cervicalgia: Secondary | ICD-10-CM | POA: Diagnosis not present

## 2020-12-02 DIAGNOSIS — M9903 Segmental and somatic dysfunction of lumbar region: Secondary | ICD-10-CM | POA: Diagnosis not present

## 2020-12-02 DIAGNOSIS — K625 Hemorrhage of anus and rectum: Secondary | ICD-10-CM

## 2020-12-02 DIAGNOSIS — M5416 Radiculopathy, lumbar region: Secondary | ICD-10-CM | POA: Diagnosis not present

## 2020-12-02 DIAGNOSIS — M9902 Segmental and somatic dysfunction of thoracic region: Secondary | ICD-10-CM | POA: Diagnosis not present

## 2020-12-02 DIAGNOSIS — M9901 Segmental and somatic dysfunction of cervical region: Secondary | ICD-10-CM | POA: Diagnosis not present

## 2020-12-02 DIAGNOSIS — M62838 Other muscle spasm: Secondary | ICD-10-CM | POA: Diagnosis not present

## 2020-12-02 NOTE — Patient Instructions (Signed)
Good to see you today  Your stool test in the office was negative for blood  I have put in a referral for you to see gastroenterologist for colonoscopy  If you have any additional bleeding, shortness of breath, dizziness, lightheadedness, hear racing, please follow up.

## 2020-12-02 NOTE — Telephone Encounter (Signed)
Noted  

## 2020-12-02 NOTE — Telephone Encounter (Signed)
Irving Burton at front desk said that pt had scheduled on my chart an appt for 12/11/20 for blood in stool. I spoke with pt and she scheduled in office appt 12/02/20 at 4PM with Harlin Heys FNP. Pt said has hx of hemorrhoids; on 11/29/20 pt saw bright red blood tinged on stool in commode; none on tissue. Pt could not remember if she strained or not with BM. No covid symptoms today except has not regained since of smell since covid in Aug 2021. NO new symptoms. UC & ED precautions given and pt voiced understanding. (did not cancel 12/11/20 appt due to wanting rx for mammogram; pt established care 07/25/20).

## 2020-12-02 NOTE — Progress Notes (Signed)
   Subjective:    Patient ID: Emily Landry, female    DOB: 1951-06-11, 69 y.o.   MRN: [id number]  HPI Chief Complaint  Patient presents with  . Blood In Stools    Since last Friday--red in toilet--no stomach/cramping    This is a 69 yo female who presents today for above cc. She  has a past medical history of Anxiety, Arthritis, Depression, and GERD (gastroesophageal reflux disease). Four days ago noticed some blood in bowel movement. Used some preparation H. Bowel movement was not hard. Next morning, the toilet bowl water looked a little red, but she did not see any frank blood on bowel movement. Has occasionally (a couple of times a year) had some blood on bowel movements. No recent chest pain, palpitations, dizziness or lightheadedness, no change in bowel movements over last 10 years.   Last colonoscopy 2018, due for repeat this year due to polyp.  No report visible in EMR.  Review of Systems Per HPI    Objective:   Physical Exam Vitals reviewed.  Constitutional:      General: She is not in acute distress.    Appearance: Normal appearance. She is obese. She is not ill-appearing, toxic-appearing or diaphoretic.  HENT:     Head: Normocephalic and atraumatic.     Right Ear: External ear normal.     Left Ear: External ear normal.  Eyes:     Conjunctiva/sclera: Conjunctivae normal.  Cardiovascular:     Rate and Rhythm: Normal rate.  Pulmonary:     Effort: Pulmonary effort is normal.  Genitourinary:    Exam position: Knee-chest position.     Rectum: Guaiac result negative. Internal hemorrhoid present. No mass, tenderness, anal fissure or external hemorrhoid. Normal anal tone.  Musculoskeletal:     Cervical back: Normal range of motion and neck supple.  Skin:    General: Skin is warm and dry.  Neurological:     Mental Status: She is alert and oriented to person, place, and time.  Psychiatric:        Mood and Affect: Mood normal.        Behavior: Behavior normal.         Thought Content: Thought content normal.        Judgment: Judgment normal.       BP 120/76   Pulse 60   Temp (!) 97.2 F (36.2 C) (Temporal)   Ht 5\' 9"  (1.753 m)   Wt 216 lb 12 oz (98.3 kg)   SpO2 97%   BMI 32.01 kg/m  Wt Readings from Last 3 Encounters:  12/02/20 216 lb 12 oz (98.3 kg)  10/03/20 208 lb (94.3 kg)  07/25/20 212 lb (96.2 kg)       Assessment & Plan:  1. Rectal bleeding -Negative guaiac in office today -No signs of acute anemia, discussed follow-up precautions-increased bleeding, dizziness/lightheadedness, palpitations, shortness of breath - Ambulatory referral to Gastroenterology  2. History of colon polyps - Ambulatory referral to Gastroenterology  This visit occurred during the SARS-CoV-2 public health emergency.  Safety protocols were in place, including screening questions prior to the visit, additional usage of staff PPE, and extensive cleaning of exam room while observing appropriate contact time as indicated for disinfecting solutions.    09/24/20, FNP-BC  [facility], [facility]  12/02/2020 4:41 PM

## 2020-12-09 DIAGNOSIS — Z1152 Encounter for screening for COVID-19: Secondary | ICD-10-CM | POA: Diagnosis not present

## 2020-12-09 DIAGNOSIS — Z03818 Encounter for observation for suspected exposure to other biological agents ruled out: Secondary | ICD-10-CM | POA: Diagnosis not present

## 2020-12-10 DIAGNOSIS — M5412 Radiculopathy, cervical region: Secondary | ICD-10-CM | POA: Diagnosis not present

## 2020-12-10 DIAGNOSIS — M5417 Radiculopathy, lumbosacral region: Secondary | ICD-10-CM | POA: Diagnosis not present

## 2020-12-10 DIAGNOSIS — M961 Postlaminectomy syndrome, not elsewhere classified: Secondary | ICD-10-CM | POA: Diagnosis not present

## 2020-12-11 ENCOUNTER — Ambulatory Visit: Payer: Medicare PPO | Admitting: Family Medicine

## 2020-12-17 ENCOUNTER — Ambulatory Visit
Payer: Medicare PPO | Attending: Student in an Organized Health Care Education/Training Program | Admitting: Student in an Organized Health Care Education/Training Program

## 2020-12-17 ENCOUNTER — Other Ambulatory Visit: Payer: Self-pay

## 2020-12-17 ENCOUNTER — Encounter: Payer: Self-pay | Admitting: Student in an Organized Health Care Education/Training Program

## 2020-12-17 DIAGNOSIS — M5416 Radiculopathy, lumbar region: Secondary | ICD-10-CM | POA: Diagnosis not present

## 2020-12-17 DIAGNOSIS — G894 Chronic pain syndrome: Secondary | ICD-10-CM

## 2020-12-17 DIAGNOSIS — G8929 Other chronic pain: Secondary | ICD-10-CM | POA: Diagnosis not present

## 2020-12-17 DIAGNOSIS — Z79899 Other long term (current) drug therapy: Secondary | ICD-10-CM | POA: Diagnosis not present

## 2020-12-17 DIAGNOSIS — M0609 Rheumatoid arthritis without rheumatoid factor, multiple sites: Secondary | ICD-10-CM | POA: Diagnosis not present

## 2020-12-17 DIAGNOSIS — R21 Rash and other nonspecific skin eruption: Secondary | ICD-10-CM | POA: Diagnosis not present

## 2020-12-17 DIAGNOSIS — M19041 Primary osteoarthritis, right hand: Secondary | ICD-10-CM | POA: Diagnosis not present

## 2020-12-17 DIAGNOSIS — M19042 Primary osteoarthritis, left hand: Secondary | ICD-10-CM | POA: Diagnosis not present

## 2020-12-17 NOTE — Progress Notes (Signed)
Patient: Emily Landry  Service Category: E/M  Provider: Edward Jolly, MD  DOB: 1951/10/18  DOS: 12/17/2020  Location: Office  MRN: [id number]  Setting: Ambulatory outpatient  Referring Provider: Lynnda Child, MD  Type: Established Patient  Specialty: Interventional Pain Management  PCP: Lynnda Child, MD  Location: Home  Delivery: TeleHealth     Virtual Encounter - Pain Management PROVIDER NOTE: Information contained herein reflects review and annotations entered in association with encounter. Interpretation of such information and data should be left to medically-trained personnel. Information provided to patient can be located elsewhere in the medical record under "Patient Instructions". Document created using STT-dictation technology, any transcriptional errors that may result from process are unintentional.    Contact & Pharmacy Preferred: [phone] Home: [phone] (home) Mobile: [phone] (mobile) E-mail: [email or web address]  [facility] [address] [facility] - [place], [place] - [address] AT [address] [place] [address] Phone: [phone] Fax: [phone]   Pre-screening  Emily Landry offered "in-person" vs "virtual" encounter. She indicated preferring virtual for this encounter.   Reason COVID-19*  Social distancing based on CDC and AMA recommendations.   I contacted Emily Landry on 12/17/2020 via video conference.      I clearly identified myself as Edward Jolly, MD. I verified that I was speaking with the correct person using two identifiers (Name: Emily Landry, and date of birth: [birth year]).  Consent I sought verbal advanced consent from Emily Landry for virtual visit interactions. I informed Emily Landry of possible security and privacy concerns, risks, and limitations associated with providing "not-in-person" medical evaluation and management services. I also informed Emily Landry of the availability of "in-person" appointments.  Finally, I informed her that there would be a charge for the virtual visit and that she could be  personally, fully or partially, financially responsible for it. Emily Landry expressed understanding and agreed to proceed.   Historic Elements   Emily Landry is a 70 y.o. year old, female patient evaluated today after our last contact on 10/03/2020. Emily Landry  has a past medical history of Anxiety, Arthritis, Depression, and GERD (gastroesophageal reflux disease). She also  has a past surgical history that includes Laminectomy (1971); Lumbar disc surgery (1991); Tonsillectomy; Cholecystectomy; and Abdominal hysterectomy. Emily Landry has a current medication list which includes the following prescription(s): duloxetine, folic acid, gabapentin, methotrexate, morphine, oxycodone-acetaminophen, prednisone, triamcinolone, and zolpidem. She  reports that she has never smoked. She has never used smokeless tobacco. She reports current alcohol use. She reports that she does not use drugs. Emily Landry has No Known Allergies.   HPI  Today, she is being contacted for discuss MRI   UDS:  Summary  Date Value Ref Range Status  10/03/2020 Note  Final    Comment:    ==================================================================== Compliance Drug Analysis, Ur ==================================================================== Test                             Result       Flag       Units  Drug Present and Declared for Prescription Verification   Morphine                       >7407        EXPECTED   ng/mg creat   Normorphine                    558  EXPECTED   ng/mg creat    Potential sources of large amounts of morphine in the absence of    codeine include administration of morphine or use of heroin.     Normorphine is an expected metabolite of morphine.    Hydromorphone                  102          EXPECTED   ng/mg creat    Hydromorphone may be present as a metabolite of morphine;     concentrations of hydromorphone rarely exceed 5% of the morphine    concentration when this is the source of hydromorphone.    Oxycodone                      1792         EXPECTED   ng/mg creat   Noroxycodone                   6961         EXPECTED   ng/mg creat    Sources of oxycodone include scheduled prescription medications.    Noroxycodone is an expected metabolite of oxycodone.    Gabapentin                     PRESENT      EXPECTED   Zolpidem                       PRESENT      EXPECTED   Zolpidem Acid                  PRESENT      EXPECTED    Zolpidem acid is an expected metabolite of zolpidem.    Acetaminophen                  PRESENT      EXPECTED  Drug Present not Declared for Prescription Verification   Carboxy-THC                    176          UNEXPECTED ng/mg creat    Carboxy-THC is a metabolite of tetrahydrocannabinol (THC). Source of    THC is most commonly herbal marijuana or marijuana-based products,    but THC is also present in a scheduled prescription medication.    Trace amounts of THC can be present in hemp and cannabidiol (CBD)    products. This test is not intended to distinguish between delta-9-    tetrahydrocannabinol, the predominant form of THC in most herbal or    marijuana-based products, and delta-8-tetrahydrocannabinol.  Drug Absent but Declared for Prescription Verification   Duloxetine                     Not Detected UNEXPECTED ==================================================================== Test                      Result    Flag   Units      Ref Range   Creatinine              135              mg/dL      >=38 ==================================================================== Declared Medications:  The flagging and interpretation on this report are based on the  following declared medications.  Unexpected results may arise from  inaccuracies in the declared medications.   **Note: The testing scope of this panel includes these  medications:   Duloxetine (Cymbalta)  Gabapentin  Morphine (MS Contin)  Oxycodone (Percocet)   **Note: The testing scope of this panel does not include small to  moderate amounts of these reported medications:   Acetaminophen (Percocet)  Zolpidem (Ambien)   **Note: The testing scope of this panel does not include the  following reported medications:   Folic Acid  Methotrexate  Prednisone  Triamcinolone (Kenalog) ==================================================================== For clinical consultation, please call [phone]. ====================================================================     Imaging  MR THORACIC SPINE WO CONTRAST CLINICAL DATA:  Mid back pain.  Spinal cord stimulator planning  EXAM: MRI THORACIC SPINE WITHOUT CONTRAST  TECHNIQUE: Multiplanar, multisequence MR imaging of the thoracic spine was performed. No intravenous contrast was administered.  COMPARISON:  None.  FINDINGS: Alignment:  Normal at the thoracic levels.  Vertebrae: Minor discogenic endplate edema seen at T3-4. Chronic superior endplate depression at T10. No evidence of bone lesion or discitis.  Cord: Normal signal and morphology. No evidence of intrathecal collection or mass.  Paraspinal and other soft tissues: Negative. Unremarkable posterior soft tissues.  Disc levels:  Generalized disc desiccation and narrowing with a few small disc protrusions including at T3-4, T6-7, and T7-8. No notable facet arthritis or ligamentum flavum thickening.  IMPRESSION: Ordinary degenerative changes. Diffusely patent spinal canal with no notable posterior element spurring or ligamentous thickening.  Electronically Signed   By: Marnee Spring M.D.   On: 11/03/2020 09:41 MR LUMBAR SPINE WO CONTRAST CLINICAL DATA:  Lumbosacral osteoarthritis. Low back pain for over 6 weeks with radiculopathy.  EXAM: MRI LUMBAR SPINE WITHOUT CONTRAST  TECHNIQUE: Multiplanar,  multisequence MR imaging of the lumbar spine was performed. No intravenous contrast was administered.  COMPARISON:  None.  FINDINGS: Segmentation:  Standard lumbar numbering  Alignment:  Mild dextroscoliosis  Vertebrae: Discogenic endplate edema at N9-G9 and to a lesser extent at L4-5 and L3-4. No evidence of fracture or discitis. No focal bone lesion.  Conus medullaris and cauda equina: Conus extends to the T12-L1 level. Conus and cauda equina appear normal.  Paraspinal and other soft tissues: Negative  Disc levels:  L2-L3: Disc narrowing and bulging. Degenerative facet spurring and ligamentum flavum thickening. Moderate left foraminal stenosis. Left subarticular recess stenosis with left L3 impingement. Spinal stenosis is overall moderate  L3-L4: Disc narrowing and bulging eccentric to the right where there is a downward pointing paracentral protrusion impinging on the right L4 nerve root. Mild to moderate right foraminal narrowing. Overall moderate spinal stenosis  L4-L5: Disc narrowing and bulging with endplate degeneration asymmetric to the right. Degenerative facet spurring on both sides. Advanced right foraminal stenosis with L4 root flattening. Moderate to advanced spinal stenosis with right more than left subarticular recess impingement  L5-S1:Disc narrowing and bulging with right paracentral to foraminal protrusion impinging on the right L5 and descending S1 nerve roots. Mild or moderate left foraminal narrowing.  IMPRESSION: 1. Degenerative disease at L2-3 and below with dextroscoliosis. 2. L2-3 moderate spinal stenosis with left L3 impingement in the subarticular recess. Left foraminal narrowing is moderate. 3. L3-4 moderate spinal stenosis with asymmetric right L4 impingement at the subarticular recess. 4. L4-5 moderate to advanced spinal stenosis with right more than left L5 impingement at the subarticular recesses. Advanced right foraminal  impingement. 5. L5-S1 advanced right foraminal and subarticular recess impingement.  Electronically Signed   By: Marnee Spring M.D.   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 Diagnoses of Lumbar radiculopathy and Chronic pain syndrome were also pertinent to this visit.  Plan of Care   Emily Landry has a current medication list which includes the following long-term medication(s): duloxetine, gabapentin, and zolpidem.  Based upon patient's lumbar MRI findings as well as clinical feedback, recommend starting with interlaminar lumbar epidural steroid injection at L4-L5.  Depending upon results can repeat or consider advanced spinal therapies such as a spinal cord stimulator trial.  Risks and benefits of lumbar ESI reviewed with patient and patient would like to proceed.  We will plan to do ESI with p.o. Valium for procedural anxiety.  Orders:  Orders Placed This Encounter  Procedures  . Lumbar Epidural Injection    Standing Status:   Future    Standing Expiration Date:   01/17/2021    Scheduling Instructions:     Procedure: Interlaminar Lumbar Epidural Steroid injection (LESI)            Laterality: Midline     Sedation: with PO Valium     Timeframe: ASAA    Order Specific Question:   Where will this procedure be performed?    Answer:   [facility]   Follow-up plan:   Return in about 2 weeks (around 12/31/2020) for L-ESI with PO Valium.   Recent Visits Date Type Provider Dept  10/03/20 Office Visit Gillis Santa, MD [facility]  Showing recent visits within past 90 days and meeting all other requirements Future Appointments No visits were found meeting these conditions. Showing future appointments within next 90 days and meeting all other requirements  I discussed the assessment and treatment plan with the patient. The patient was provided an opportunity to ask questions and all were answered.  The patient agreed with the plan and demonstrated an understanding of the instructions.  Patient advised to call back or seek an in-person evaluation if the symptoms or condition worsens.  Duration of encounter:30 minutes.  Note by: Gillis Santa, MD Date: 12/17/2020; Time: 3:09 PM

## 2020-12-29 ENCOUNTER — Telehealth: Payer: Self-pay

## 2020-12-29 NOTE — Telephone Encounter (Signed)
Attempted to call and reschedule appointment due to weather.  Unable to leave message because there was no voicemail set up.  Will send message to secretaries to reschedule

## 2020-12-29 NOTE — Telephone Encounter (Signed)
2nd attempt to notify patient that clinic would be closed tomorrow due to weather.  Voicemail full

## 2020-12-30 ENCOUNTER — Ambulatory Visit: Payer: Medicare PPO | Admitting: Student in an Organized Health Care Education/Training Program

## 2020-12-30 ENCOUNTER — Telehealth: Payer: Self-pay

## 2020-12-30 NOTE — Telephone Encounter (Signed)
Attempted to call patient to notify her  That the office was closed due to weather and to reschedule her.  Her voicemail box was full and I was unable to reach her. Please call and reschedule.

## 2020-12-30 NOTE — Telephone Encounter (Signed)
Received message on clinic phone asking about whether the clinic was open.  Attempted to call patient again and her voicemail was full so again, I could not leave a message.

## 2021-01-02 DIAGNOSIS — L500 Allergic urticaria: Secondary | ICD-10-CM | POA: Diagnosis not present

## 2021-01-02 DIAGNOSIS — D2272 Melanocytic nevi of left lower limb, including hip: Secondary | ICD-10-CM | POA: Diagnosis not present

## 2021-01-02 DIAGNOSIS — D2261 Melanocytic nevi of right upper limb, including shoulder: Secondary | ICD-10-CM | POA: Diagnosis not present

## 2021-01-02 DIAGNOSIS — L710 Perioral dermatitis: Secondary | ICD-10-CM | POA: Diagnosis not present

## 2021-01-02 DIAGNOSIS — D2262 Melanocytic nevi of left upper limb, including shoulder: Secondary | ICD-10-CM | POA: Diagnosis not present

## 2021-01-02 DIAGNOSIS — D2271 Melanocytic nevi of right lower limb, including hip: Secondary | ICD-10-CM | POA: Diagnosis not present

## 2021-01-02 DIAGNOSIS — L309 Dermatitis, unspecified: Secondary | ICD-10-CM | POA: Diagnosis not present

## 2021-01-02 DIAGNOSIS — D225 Melanocytic nevi of trunk: Secondary | ICD-10-CM | POA: Diagnosis not present

## 2021-01-06 ENCOUNTER — Encounter: Payer: Self-pay | Admitting: Student in an Organized Health Care Education/Training Program

## 2021-01-06 ENCOUNTER — Ambulatory Visit
Admission: RE | Admit: 2021-01-06 | Discharge: 2021-01-06 | Disposition: A | Discharge status: Home / Self Care | Payer: Medicare PPO | Source: Ambulatory Visit | Attending: Student in an Organized Health Care Education/Training Program | Admitting: Student in an Organized Health Care Education/Training Program

## 2021-01-06 ENCOUNTER — Other Ambulatory Visit: Payer: Self-pay

## 2021-01-06 ENCOUNTER — Ambulatory Visit (HOSPITAL_BASED_OUTPATIENT_CLINIC_OR_DEPARTMENT_OTHER): Payer: Medicare PPO | Admitting: Student in an Organized Health Care Education/Training Program

## 2021-01-06 VITALS — BP 159/87 | HR 68 | Temp 97.0°F | Resp 19 | Ht 69.0 in | Wt 205.0 lb

## 2021-01-06 DIAGNOSIS — G8929 Other chronic pain: Secondary | ICD-10-CM | POA: Insufficient documentation

## 2021-01-06 DIAGNOSIS — M5416 Radiculopathy, lumbar region: Secondary | ICD-10-CM

## 2021-01-06 DIAGNOSIS — G894 Chronic pain syndrome: Secondary | ICD-10-CM | POA: Diagnosis not present

## 2021-01-06 MED ORDER — DIAZEPAM 5 MG PO TABS
5.0000 mg | ORAL_TABLET | Freq: Once | ORAL | Status: AC
  Administered 2021-01-06: 5 mg via ORAL

## 2021-01-06 MED ORDER — DIAZEPAM 5 MG PO TABS
ORAL_TABLET | ORAL | Status: AC
  Filled 2021-01-06: qty 1

## 2021-01-06 MED ORDER — LIDOCAINE HCL 2 % IJ SOLN
20.0000 mL | Freq: Once | INTRAMUSCULAR | Status: AC
  Administered 2021-01-06: 100 mg
  Filled 2021-01-06: qty 20

## 2021-01-06 MED ORDER — DEXAMETHASONE SODIUM PHOSPHATE 10 MG/ML IJ SOLN
10.0000 mg | Freq: Once | INTRAMUSCULAR | Status: AC
  Administered 2021-01-06: 10 mg
  Filled 2021-01-06: qty 1

## 2021-01-06 MED ORDER — LIDOCAINE HCL (PF) 2 % IJ SOLN
INTRAMUSCULAR | Status: AC
  Filled 2021-01-06: qty 5

## 2021-01-06 MED ORDER — SODIUM CHLORIDE 0.9% FLUSH
2.0000 mL | Freq: Once | INTRAVENOUS | Status: AC
  Administered 2021-01-06: 10 mL

## 2021-01-06 MED ORDER — IOHEXOL 180 MG/ML  SOLN
10.0000 mL | Freq: Once | INTRAMUSCULAR | Status: AC
  Administered 2021-01-06: 10 mL via EPIDURAL
  Filled 2021-01-06: qty 20

## 2021-01-06 MED ORDER — ROPIVACAINE HCL 2 MG/ML IJ SOLN
2.0000 mL | Freq: Once | INTRAMUSCULAR | Status: AC
  Administered 2021-01-06: 10 mL via EPIDURAL
  Filled 2021-01-06: qty 10

## 2021-01-06 NOTE — Patient Instructions (Signed)

## 2021-01-06 NOTE — Progress Notes (Signed)
Safety precautions to be maintained throughout the outpatient stay will include: orient to surroundings, keep bed in low position, maintain call bell within reach at all times, provide assistance with transfer out of bed and ambulation.  

## 2021-01-06 NOTE — Progress Notes (Signed)
PROVIDER NOTE: Information contained herein reflects review and annotations entered in association with encounter. Interpretation of such information and data should be left to medically-trained personnel. Information provided to patient can be located elsewhere in the medical record under "Patient Instructions". Document created using STT-dictation technology, any transcriptional errors that may result from process are unintentional.    Patient: Emily Landry  Service Category: Procedure  Provider: Edward Jolly, MD  DOB: 07/06/1951  DOS: 01/06/2021  Location: [facility]  MRN: [id number]  Setting: Ambulatory - outpatient  Referring Provider: Lynnda Child, MD  Type: Established Patient  Specialty: Interventional Pain Management  PCP: Lynnda Child, MD   Primary Reason for Visit: Interventional Pain Management Treatment. CC: Back Pain  Procedure:          Anesthesia, Analgesia, Anxiolysis:  Type: Diagnostic Inter-Laminar Epidural Steroid Injection  #1  Region: Lumbar Level: L5-S1 Level. Laterality: Left-Sided         Type: Local Anesthesia with p.o. Valium  Local Anesthetic: Lidocaine 1-2%  Position: Prone with head of the table was raised to facilitate breathing.   Indications: 1. Chronic radicular lumbar pain   2. Lumbar radiculopathy   3. Chronic pain syndrome    Pain Score: Pre-procedure: 6 /10 Post-procedure: 6 /10   Pre-op H&P Assessment:  Emily Landry is a 70 y.o. (year old), female patient, seen today for interventional treatment. She  has a past surgical history that includes Laminectomy (1971); Lumbar disc surgery (1991); Tonsillectomy; Cholecystectomy; and Abdominal hysterectomy. Emily Landry has a current medication list which includes the following prescription(s): duloxetine, folic acid, gabapentin, methotrexate, morphine, oxycodone-acetaminophen, prednisone, triamcinolone, and zolpidem. Her primarily concern today is the Back Pain  Initial Vital  Signs:  Pulse/HCG Rate: 68ECG Heart Rate: 68 Temp: (!) 97 F (36.1 C) Resp: 18 BP: 135/75 SpO2: 97 %  BMI: Estimated body mass index is 30.27 kg/m as calculated from the following:   Height as of this encounter:  (1.753 m).   Weight as of this encounter: 205 lb (93 kg).  Risk Assessment: Allergies: Reviewed. She has No Known Allergies.  Allergy Precautions: None required Coagulopathies: Reviewed. None identified.  Blood-thinner therapy: None at this time Active Infection(s): Reviewed. None identified. Emily Landry is afebrile  Site Confirmation: [name] was asked to confirm the procedure and laterality before marking the site Procedure checklist: Completed Consent: Before the procedure and under the influence of no sedative(s), amnesic(s), or anxiolytics, the patient was informed of the treatment options, risks and possible complications. To fulfill our ethical and legal obligations, as recommended by the American Medical Association's Code of Ethics, I have informed the patient of my clinical impression; the nature and purpose of the treatment or procedure; the risks, benefits, and possible complications of the intervention; the alternatives, including doing nothing; the risk(s) and benefit(s) of the alternative treatment(s) or procedure(s); and the risk(s) and benefit(s) of doing nothing. The patient was provided information about the general risks and possible complications associated with the procedure. These may include, but are not limited to: failure to achieve desired goals, infection, bleeding, organ or nerve damage, allergic reactions, paralysis, and death. In addition, the patient was informed of those risks and complications associated to Spine-related procedures, such as failure to decrease pain; infection (i.e.: Meningitis, epidural or intraspinal abscess); bleeding (i.e.: epidural hematoma, subarachnoid hemorrhage, or any other type of intraspinal or peri-dural  bleeding); organ or nerve damage (i.e.: Any type of peripheral nerve, nerve root, or spinal cord injury)  with subsequent damage to sensory, motor, and/or autonomic systems, resulting in permanent pain, numbness, and/or weakness of one or several areas of the body; allergic reactions; (i.e.: anaphylactic reaction); and/or death. Furthermore, the patient was informed of those risks and complications associated with the medications. These include, but are not limited to: allergic reactions (i.e.: anaphylactic or anaphylactoid reaction(s)); adrenal axis suppression; blood sugar elevation that in diabetics may result in ketoacidosis or comma; water retention that in patients with history of congestive heart failure may result in shortness of breath, pulmonary edema, and decompensation with resultant heart failure; weight gain; swelling or edema; medication-induced neural toxicity; particulate matter embolism and blood vessel occlusion with resultant organ, and/or nervous system infarction; and/or aseptic necrosis of one or more joints. Finally, the patient was informed that Medicine is not an exact science; therefore, there is also the possibility of unforeseen or unpredictable risks and/or possible complications that may result in a catastrophic outcome. The patient indicated having understood very clearly. We have given the patient no guarantees and we have made no promises. Enough time was given to the patient to ask questions, all of which were answered to the patient's satisfaction. Emily Landry has indicated that she wanted to continue with the procedure. Attestation: I, the ordering provider, attest that I have discussed with the patient the benefits, risks, side-effects, alternatives, likelihood of achieving goals, and potential problems during recovery for the procedure that I have provided informed consent. Date  Time: 01/06/2021  9:31 AM  Pre-Procedure Preparation:  Monitoring: As per clinic protocol.  Respiration, ETCO2, SpO2, BP, heart rate and rhythm monitor placed and checked for adequate function Safety Precautions: Patient was assessed for positional comfort and pressure points before starting the procedure. Time-out: I initiated and conducted the "Time-out" before starting the procedure, as per protocol. The patient was asked to participate by confirming the accuracy of the "Time Out" information. Verification of the correct person, site, and procedure were performed and confirmed by me, the nursing staff, and the patient. "Time-out" conducted as per Joint Commission's Universal Protocol (UP.01.01.01). Time: 1017  Description of Procedure:          Target Area: The interlaminar space, initially targeting the lower laminar border of the superior vertebral body. Approach: Paramedial approach. Area Prepped: Entire Posterior Lumbar Region DuraPrep (Iodine Povacrylex [0.7% available iodine] and Isopropyl Alcohol, 74% w/w) Safety Precautions: Aspiration looking for blood return was conducted prior to all injections. At no point did we inject any substances, as a needle was being advanced. No attempts were made at seeking any paresthesias. Safe injection practices and needle disposal techniques used. Medications properly checked for expiration dates. SDV (single dose vial) medications used. Description of the Procedure: Protocol guidelines were followed. The procedure needle was introduced through the skin, ipsilateral to the reported pain, and advanced to the target area. Bone was contacted and the needle walked caudad, until the lamina was cleared. The epidural space was identified using "loss-of-resistance technique" with 2-3 ml of PF-NaCl (0.9% NSS), in a 5cc LOR glass syringe.  Vitals:   01/06/21 0935 01/06/21 1019 01/06/21 1023 01/06/21 1025  BP: 135/75 (!) 152/85 (!) 165/93 (!) 159/87  Pulse: 68     Resp: 18 12 18 19   Temp: (!) 97 F (36.1 C)     TempSrc: Temporal     SpO2: 97% 97% 97%  98%  Weight: 205 lb (93 kg)     Height: 5\' 9"  (1.753 m)       Start Time:  1018 hrs. End Time: 1024 hrs.  Materials:  Needle(s) Type: Epidural needle Gauge: 17G Length: 3.5-in Medication(s): Please see orders for medications and dosing details.  Imaging Guidance (Spinal):          Type of Imaging Technique: Fluoroscopy Guidance (Spinal) Indication(s): Assistance in needle guidance and placement for procedures requiring needle placement in or near specific anatomical locations not easily accessible without such assistance. Exposure Time: Please see nurses notes. Contrast: Before injecting any contrast, we confirmed that the patient did not have an allergy to iodine, shellfish, or radiological contrast. Once satisfactory needle placement was completed at the desired level, radiological contrast was injected. Contrast injected under live fluoroscopy. No contrast complications. See chart for type and volume of contrast used. Fluoroscopic Guidance: I was personally present during the use of fluoroscopy. "Tunnel Vision Technique" used to obtain the best possible view of the target area. Parallax error corrected before commencing the procedure. "Direction-depth-direction" technique used to introduce the needle under continuous pulsed fluoroscopy. Once target was reached, antero-posterior, oblique, and lateral fluoroscopic projection used confirm needle placement in all planes. Images permanently stored in EMR. Interpretation: I personally interpreted the imaging intraoperatively. Adequate needle placement confirmed in multiple planes. Appropriate spread of contrast into desired area was observed. No evidence of afferent or efferent intravascular uptake. No intrathecal or subarachnoid spread observed. Permanent images saved into the patient's record.  Antibiotic Prophylaxis:   Anti-infectives (From admission, onward)   None     Indication(s): None identified  Post-operative Assessment:   Post-procedure Vital Signs:  Pulse/HCG Rate: 6871 Temp: (!) 97 F (36.1 C) Resp: 19 BP: (!) 159/87 SpO2: 98 %  EBL: None  Complications: No immediate post-treatment complications observed by team, or reported by patient.  Note: The patient tolerated the entire procedure well. A repeat set of vitals were taken after the procedure and the patient was kept under observation following institutional policy, for this type of procedure. Post-procedural neurological assessment was performed, showing return to baseline, prior to discharge. The patient was provided with post-procedure discharge instructions, including a section on how to identify potential problems. Should any problems arise concerning this procedure, the patient was given instructions to immediately contact us, at any time, without hesitation. In any case, we plan to contact the patient by telephone for a follow-up status report regarding this interventional procedure.  Comments:  No additional relevant information.  5 out of 5 strength bilateral lower extremity: Plantar flexion, dorsiflexion, knee flexion, knee extension.  Plan of Care  Orders:  Orders Placed This Encounter  Procedures  . DG PAIN CLINIC C-ARM 1-60 MIN NO REPORT    Intraoperative interpretation by procedural physician at [facility].    Standing Status:   Standing    Number of Occurrences:   1    Order Specific Question:   Reason for exam:    Answer:   Assistance in needle guidance and placement for procedures requiring needle placement in or near specific anatomical locations not easily accessible without such assistance.   Medications ordered for procedure: Meds ordered this encounter  Medications  . iohexol (OMNIPAQUE) 180 MG/ML injection 10 mL    Must be Myelogram-compatible. If not available, you may substitute with a water-soluble, non-ionic, hypoallergenic, myelogram-compatible radiological contrast medium.  Marland Kitchen lidocaine (XYLOCAINE) 2 %  (with pres) injection 400 mg  . ropivacaine (PF) 2 mg/mL (0.2%) (NAROPIN) injection 2 mL  . sodium chloride flush (NS) 0.9 % injection 2 mL  . dexamethasone (DECADRON) injection 10 mg  .  diazepam (VALIUM) tablet 5 mg   Medications administered: We administered iohexol, lidocaine, ropivacaine (PF) 2 mg/mL (0.2%), sodium chloride flush, dexamethasone, and diazepam.  See the medical record for exact dosing, route, and time of administration.  Follow-up plan:   Return in about 3 weeks (around 01/27/2021) for Post Procedure Evaluation, virtual.      Left L5-S1 ESI #1,  01/06/2021, 6 cc injected   Recent Visits No visits were found meeting these conditions. Showing recent visits within past 90 days and meeting all other requirements Today's Visits Date Type Provider Dept  01/06/21 Procedure visit Edward Jolly, MD [facility]  Showing today's visits and meeting all other requirements Future Appointments No visits were found meeting these conditions. Showing future appointments within next 90 days and meeting all other requirements  Disposition: Discharge home  Discharge (Date  Time): 01/06/2021; 1030 hrs.   Primary Care Physician: Lynnda Child, MD Location: [facility] Note by: Edward Jolly, MD Date: 01/06/2021; Time: 10:32 AM  Disclaimer:  Medicine is not an exact science. The only guarantee in medicine is that nothing is guaranteed. It is important to note that the decision to proceed with this intervention was based on the information collected from the patient. The Data and conclusions were drawn from the patient's questionnaire, the interview, and the physical examination. Because the information was provided in large part by the patient, it cannot be guaranteed that it has not been purposely or unconsciously manipulated. Every effort has been made to obtain as much relevant data as possible for this evaluation. It is important to note that the  conclusions that lead to this procedure are derived in large part from the available data. Always take into account that the treatment will also be dependent on availability of resources and existing treatment guidelines, considered by other Pain Management Practitioners as being common knowledge and practice, at the time of the intervention. For Medico-Legal purposes, it is also important to point out that variation in procedural techniques and pharmacological choices are the acceptable norm. The indications, contraindications, technique, and results of the above procedure should only be interpreted and judged by a Board-Certified Interventional Pain Specialist with extensive familiarity and expertise in the same exact procedure and technique.

## 2021-01-07 ENCOUNTER — Telehealth: Payer: Self-pay | Admitting: *Deleted

## 2021-01-07 DIAGNOSIS — M961 Postlaminectomy syndrome, not elsewhere classified: Secondary | ICD-10-CM | POA: Diagnosis not present

## 2021-01-07 DIAGNOSIS — M5412 Radiculopathy, cervical region: Secondary | ICD-10-CM | POA: Diagnosis not present

## 2021-01-07 DIAGNOSIS — M5417 Radiculopathy, lumbosacral region: Secondary | ICD-10-CM | POA: Diagnosis not present

## 2021-01-07 NOTE — Telephone Encounter (Signed)
No problems post procedure. 

## 2021-01-20 ENCOUNTER — Other Ambulatory Visit: Payer: Self-pay

## 2021-01-20 ENCOUNTER — Encounter: Payer: Self-pay | Admitting: Gastroenterology

## 2021-01-20 ENCOUNTER — Ambulatory Visit: Payer: Medicare PPO | Admitting: Gastroenterology

## 2021-01-20 VITALS — BP 144/85 | HR 67 | Temp 98.2°F | Ht 69.0 in | Wt 220.2 lb

## 2021-01-20 DIAGNOSIS — D519 Vitamin B12 deficiency anemia, unspecified: Secondary | ICD-10-CM | POA: Diagnosis not present

## 2021-01-20 DIAGNOSIS — Z86010 Personal history of colonic polyps: Secondary | ICD-10-CM | POA: Diagnosis not present

## 2021-01-20 DIAGNOSIS — R5382 Chronic fatigue, unspecified: Secondary | ICD-10-CM

## 2021-01-20 DIAGNOSIS — K5909 Other constipation: Secondary | ICD-10-CM

## 2021-01-20 DIAGNOSIS — K219 Gastro-esophageal reflux disease without esophagitis: Secondary | ICD-10-CM | POA: Diagnosis not present

## 2021-01-20 MED ORDER — MAGNESIUM CITRATE PO SOLN: 1.0000 | Freq: Once | ORAL | 0 refills | Status: AC

## 2021-01-20 MED ORDER — OMEPRAZOLE 40 MG PO CPDR: 40.0000 mg | DELAYED_RELEASE_CAPSULE | Freq: Every day | ORAL | 2 refills | Status: DC

## 2021-01-20 MED ORDER — NA SULFATE-K SULFATE-MG SULF 17.5-3.13-1.6 GM/177ML PO SOLN: 354.0000 mL | Freq: Once | ORAL | 0 refills | Status: AC

## 2021-01-20 NOTE — Progress Notes (Signed)
Arlyss Repress, MD [address]  [address]  [place], [place] [address]  Main: [phone]  Fax: [phone]    Gastroenterology Consultation  Referring Provider:     Emi Belfast, FNP Primary Care Physician:  Lynnda Child, MD Primary Gastroenterologist:  Dr. Arlyss Repress Reason for Consultation:     Chronic GERD, constipation, history of colon polyp        HPI:   Emily Landry is a 70 y.o. female referred by Dr. Lynnda Child, MD  for consultation & management of chronic GERD, chronic constipation  Chronic GERD: Patient reports history of heartburn, regurgitation for several months, has been predominantly nocturnal.  She is taking over-the-counter omeprazole as needed only.  She denies any difficulty swallowing.  She reports having had an upper endoscopy several years ago, was told that she has hiatal hernia.  She does drink coffee  Chronic constipation: Patient reports having irregular bowel habits, on Bristol stool scale 2 or 3.  She denies any abdominal pain or bloating.  She feels lethargic.  Patient reports that she has been gaining weight for last 1 to 2 years, is diagnosed with seronegative rheumatoid arthritis, has been on prednisone along with methotrexate as well as Plaquenil.  She moved from [place] to [place] in August 2021 along with her husband.  She reports that her physical activity has significantly decreased due to extreme lethargy.  Most recent labs revealed normal CBC, CMP, CRP.  She also reports that her mood is okay, she does not feel depressed other than extreme lethargy.  She does have low back issues for which she also received steroid injection.  NSAIDs: None  Antiplts/Anticoagulants/Anti thrombotics: None  GI Procedures: Had a colonoscopy more than 3 years ago in [place], was found to have a polyp.  She was recommended to have repeat colonoscopy in 3 years  She denies family history of GI malignancy/colon cancer  Past  Medical History:  Diagnosis Date  . Anxiety   . Arthritis   . Depression   . GERD (gastroesophageal reflux disease)     Past Surgical History:  Procedure Laterality Date  . ABDOMINAL HYSTERECTOMY     no longer has cervix  . CHOLECYSTECTOMY    . LAMINECTOMY  1971  . LUMBAR DISC SURGERY  1991  . TONSILLECTOMY      Current Outpatient Medications:  .  DULoxetine (CYMBALTA) 60 MG capsule, Take 1 capsule (60 mg total) by mouth daily., Disp: 90 capsule, Rfl: 3 .  folic acid (FOLVITE) 1 MG tablet, , Disp: , Rfl:  .  gabapentin (NEURONTIN) 100 MG capsule, Take 100 mg by mouth 3 (three) times daily., Disp: , Rfl:  .  Hydroxychloroquine Sulfate 100 MG TABS, , Disp: , Rfl:  .  magnesium citrate SOLN, Take 296 mLs (1 Bottle total) by mouth once for 1 dose., Disp: 195 mL, Rfl: 0 .  methotrexate (RHEUMATREX) 2.5 MG tablet, Take by mouth., Disp: , Rfl:  .  morphine (MS CONTIN) 15 MG 12 hr tablet, Take 15 mg by mouth 3 (three) times daily as needed for pain., Disp: , Rfl:  .  Na Sulfate-K Sulfate-Mg Sulf 17.5-3.13-1.6 GM/177ML SOLN, Take 354 mLs by mouth once for 1 dose., Disp: 354 mL, Rfl: 0 .  omeprazole (PRILOSEC) 40 MG capsule, Take 1 capsule (40 mg total) by mouth daily before breakfast., Disp: 30 capsule, Rfl: 2 .  oxyCODONE-acetaminophen (PERCOCET) 10-325 MG tablet, Take 1 tablet by mouth every 8 (eight) hours  as needed for pain., Disp: , Rfl:  .  predniSONE (DELTASONE) 5 MG tablet, Take 5 mg by mouth daily with breakfast. , Disp: , Rfl:  .  triamcinolone cream (KENALOG) 0.1 %, , Disp: , Rfl:  .  zolpidem (AMBIEN) 5 MG tablet, Take 5 mg by mouth at bedtime as needed for sleep., Disp: , Rfl:    Family History  Problem Relation Age of Onset  . Breast cancer Mother 59  . Diabetes Mother   . Rheum arthritis Mother   . Arthritis Father   . Gout Father   . Alcohol abuse Father      Social History   Tobacco Use  . Smoking status: Never Smoker  . Smokeless tobacco: Never Used  Vaping  Use  . Vaping Use: Never used  Substance Use Topics  . Alcohol use: Yes    Comment: 1-2 days a drink, 1 drink  . Drug use: Never    Allergies as of 01/20/2021  . (No Known Allergies)    Review of Systems:    All systems reviewed and negative except where noted in HPI.   Physical Exam:  BP (!) 144/85 (BP Location: Left Arm, Patient Position: Sitting, Cuff Size: Normal)   Pulse 67   Temp 98.2 F (36.8 C) (Oral)   Ht 5\' 9"  (1.753 m)   Wt 220 lb 4 oz (99.9 kg)   BMI 32.53 kg/m  No LMP recorded. Patient is postmenopausal.  General:   Alert,  Well-developed, well-nourished, pleasant and cooperative in NAD Head:  Normocephalic and atraumatic. Eyes:  Sclera clear, no icterus.   Conjunctiva pink. Ears:  Normal auditory acuity. Nose:  No deformity, discharge, or lesions. Mouth:  No deformity or lesions,oropharynx pink & moist. Neck:  Supple; no masses or thyromegaly. Lungs:  Respirations even and unlabored.  Clear throughout to auscultation.   No wheezes, crackles, or rhonchi. No acute distress. Heart:  Regular rate and rhythm; no murmurs, clicks, rubs, or gallops. Abdomen:  Normal bowel sounds. Soft, non-tender and non-distended without masses, hepatosplenomegaly or hernias noted.  No guarding or rebound tenderness.   Rectal: Not performed Msk:  Symmetrical without gross deformities. Good, equal movement & strength bilaterally. Pulses:  Normal pulses noted. Extremities:  No clubbing or edema.  No cyanosis. Neurologic:  Alert and oriented x3;  grossly normal neurologically. Skin:  Intact without significant lesions or rashes. No jaundice. Psych:  Alert and cooperative. Normal mood and affect.  Imaging Studies: None  Assessment and Plan:   Emily Landry is a 70 y.o. pleasant Caucasian female with BMI 32.5, seronegative rheumatoid arthritis, currently maintained on low-dose prednisone, methotrexate as well as hydroxychloroquine is seen in consultation for chronic GERD as well  as chronic constipation, personal history of colon polyp  Chronic GERD Discussed about antireflux lifestyle, information provided Start omeprazole 40 mg daily before dinner, will increase to twice daily if needed Recommend upper endoscopy if symptoms are persistent despite maximal medical therapy  Chronic constipation Discussed about high-fiber diet, information provided. Encouraged to take MiraLAX regularly  Personal history of colon polyp Recommend surveillance colonoscopy with 2-day prep  Lethargy/fatigue TSH normal.  No evidence of anemia Check iron panel, B12 levels   Follow up in 3 months   78, MD

## 2021-01-20 NOTE — Patient Instructions (Signed)
Gastroesophageal Reflux Disease, Adult  Gastroesophageal reflux (GER) happens when acid from the stomach flows up into the tube that connects the mouth and the stomach (esophagus). Normally, food travels down the esophagus and stays in the stomach to be digested. With GER, food and stomach acid sometimes move back up into the esophagus. You may have a disease called gastroesophageal reflux disease (GERD) if the reflux:  Happens often.  Causes frequent or very bad symptoms.  Causes problems such as damage to the esophagus. When this happens, the esophagus becomes sore and swollen. Over time, GERD can make small holes (ulcers) in the lining of the esophagus. What are the causes? This condition is caused by a problem with the muscle between the esophagus and the stomach. When this muscle is weak or not normal, it does not close properly to keep food and acid from coming back up from the stomach. The muscle can be weak because of:  Tobacco use.  Pregnancy.  Having a certain type of hernia (hiatal hernia).  Alcohol use.  Certain foods and drinks, such as coffee, chocolate, onions, and peppermint. What increases the risk?  Being overweight.  Having a disease that affects your connective tissue.  Taking NSAIDs, such a ibuprofen. What are the signs or symptoms?  Heartburn.  Difficult or painful swallowing.  The feeling of having a lump in the throat.  A bitter taste in the mouth.  Bad breath.  Having a lot of saliva.  Having an upset or bloated stomach.  Burping.  Chest pain. Different conditions can cause chest pain. Make sure you see your doctor if you have chest pain.  Shortness of breath or wheezing.  A long-term cough or a cough at night.  Wearing away of the surface of teeth (tooth enamel).  Weight loss. How is this treated?  Making changes to your diet.  Taking medicine.  Having surgery. Treatment will depend on how bad your symptoms are. Follow these  instructions at home: Eating and drinking  Follow a diet as told by your doctor. You may need to avoid foods and drinks such as: ? Coffee and tea, with or without caffeine. ? Drinks that contain alcohol. ? Energy drinks and sports drinks. ? Bubbly (carbonated) drinks or sodas. ? Chocolate and cocoa. ? Peppermint and mint flavorings. ? Garlic and onions. ? Horseradish. ? Spicy and acidic foods. These include peppers, chili powder, curry powder, vinegar, hot sauces, and BBQ sauce. ? Citrus fruit juices and citrus fruits, such as oranges, lemons, and limes. ? Tomato-based foods. These include red sauce, chili, salsa, and pizza with red sauce. ? Fried and fatty foods. These include donuts, french fries, potato chips, and high-fat dressings. ? High-fat meats. These include hot dogs, rib eye steak, sausage, ham, and bacon. ? High-fat dairy items, such as whole milk, butter, and cream cheese.  Eat small meals often. Avoid eating large meals.  Avoid drinking large amounts of liquid with your meals.  Avoid eating meals during the 2-3 hours before bedtime.  Avoid lying down right after you eat.  Do not exercise right after you eat.   Lifestyle  Do not smoke or use any products that contain nicotine or tobacco. If you need help quitting, ask your doctor.  Try to lower your stress. If you need help doing this, ask your doctor.  If you are overweight, lose an amount of weight that is healthy for you. Ask your doctor about a safe weight loss goal.   General instructions    Pay attention to any changes in your symptoms.  Take over-the-counter and prescription medicines only as told by your doctor.  Do not take aspirin, ibuprofen, or other NSAIDs unless your doctor says it is okay.  Wear loose clothes. Do not wear anything tight around your waist.  Raise (elevate) the head of your bed about 6 inches (15 cm). You may need to use a wedge to do this.  Avoid bending over if this makes your  symptoms worse.  Keep all follow-up visits. Contact a doctor if:  You have new symptoms.  You lose weight and you do not know why.  You have trouble swallowing or it hurts to swallow.  You have wheezing or a cough that keeps happening.  You have a hoarse voice.  Your symptoms do not get better with treatment. Get help right away if:  You have sudden pain in your arms, neck, jaw, teeth, or back.  You suddenly feel sweaty, dizzy, or light-headed.  You have chest pain or shortness of breath.  You vomit and the vomit is green, yellow, or black, or it looks like blood or coffee grounds.  You faint.  Your poop (stool) is red, bloody, or black.  You cannot swallow, drink, or eat. These symptoms may represent a serious problem that is an emergency. Do not wait to see if the symptoms will go away. Get medical help right away. Call your local emergency services (911 in the [place] Do not drive yourself to the hospital. Summary  If a person has gastroesophageal reflux disease (GERD), food and stomach acid move back up into the esophagus and cause symptoms or problems such as damage to the esophagus.  Treatment will depend on how bad your symptoms are.  Follow a diet as told by your doctor.  Take all medicines only as told by your doctor. This information is not intended to replace advice given to you by your health care provider. Make sure you discuss any questions you have with your health care provider. Document Revised: 06/10/2020 Document Reviewed: 06/10/2020 Elsevier Patient Education  2021 Elsevier Inc. High-Fiber Eating Plan Fiber, also called dietary fiber, is a type of carbohydrate. It is found foods such as fruits, vegetables, whole grains, and beans. A high-fiber diet can have many health benefits. Your health care provider may recommend a high-fiber diet to help:  Prevent constipation. Fiber can make your bowel movements more regular.  Lower your  cholesterol.  Relieve the following conditions: ? Inflammation of veins in the anus (hemorrhoids). ? Inflammation of specific areas of the digestive tract (uncomplicated diverticulosis). ? A problem of the large intestine, also called the colon, that sometimes causes pain and diarrhea (irritable bowel syndrome, or IBS).  Prevent overeating as part of a weight-loss plan.  Prevent heart disease, type 2 diabetes, and certain cancers. What are tips for following this plan? Reading food labels  Check the nutrition facts label on food products for the amount of dietary fiber. Choose foods that have 5 grams of fiber or more per serving.  The goals for recommended daily fiber intake include: ? Men (age 50 or younger): 34-38 g. ? Men (over age 50): 28-34 g. ? Women (age 50 or younger): 25-28 g. ? Women (over age 50): 22-25 g. Your daily fiber goal is _____________ g.   Shopping  Choose whole fruits and vegetables instead of processed forms, such as apple juice or applesauce.  Choose a wide variety of high-fiber foods such as avocados, lentils, oats, and   kidney beans.  Read the nutrition facts label of the foods you choose. Be aware of foods with added fiber. These foods often have high sugar and sodium amounts per serving. Cooking  Use whole-grain flour for baking and cooking.  Cook with brown rice instead of white rice. Meal planning  Start the day with a breakfast that is high in fiber, such as a cereal that contains 5 g of fiber or more per serving.  Eat breads and cereals that are made with whole-grain flour instead of refined flour or white flour.  Eat brown rice, bulgur wheat, or millet instead of white rice.  Use beans in place of meat in soups, salads, and pasta dishes.  Be sure that half of the grains you eat each day are whole grains. General information  You can get the recommended daily intake of dietary fiber by: ? Eating a variety of fruits, vegetables, grains,  nuts, and beans. ? Taking a fiber supplement if you are not able to take in enough fiber in your diet. It is better to get fiber through food than from a supplement.  Gradually increase how much fiber you consume. If you increase your intake of dietary fiber too quickly, you may have bloating, cramping, or gas.  Drink plenty of water to help you digest fiber.  Choose high-fiber snacks, such as berries, raw vegetables, nuts, and popcorn. What foods should I eat? Fruits Berries. Pears. Apples. Oranges. Avocado. Prunes and raisins. Dried figs. Vegetables Sweet potatoes. Spinach. Kale. Artichokes. Cabbage. Broccoli. Cauliflower. Green peas. Carrots. Squash. Grains Whole-grain breads. Multigrain cereal. Oats and oatmeal. Brown rice. Barley. Bulgur wheat. Millet. Quinoa. Bran muffins. Popcorn. Rye wafer crackers. Meats and other proteins Navy beans, kidney beans, and pinto beans. Soybeans. Split peas. Lentils. Nuts and seeds. Dairy Fiber-fortified yogurt. Beverages Fiber-fortified soy milk. Fiber-fortified orange juice. Other foods Fiber bars. The items listed above may not be a complete list of recommended foods and beverages. Contact a dietitian for more information. What foods should I avoid? Fruits Fruit juice. Cooked, strained fruit. Vegetables Fried potatoes. Canned vegetables. Well-cooked vegetables. Grains White bread. Pasta made with refined flour. White rice. Meats and other proteins Fatty cuts of meat. Fried chicken or fried fish. Dairy Milk. Yogurt. Cream cheese. Sour cream. Fats and oils Butters. Beverages Soft drinks. Other foods Cakes and pastries. The items listed above may not be a complete list of foods and beverages to avoid. Talk with your dietitian about what choices are best for you. Summary  Fiber is a type of carbohydrate. It is found in foods such as fruits, vegetables, whole grains, and beans.  A high-fiber diet has many benefits. It can help to  prevent constipation, lower blood cholesterol, aid weight loss, and reduce your risk of heart disease, diabetes, and certain cancers.  Increase your intake of fiber gradually. Increasing fiber too quickly may cause cramping, bloating, and gas. Drink plenty of water while you increase the amount of fiber you consume.  The best sources of fiber include whole fruits and vegetables, whole grains, nuts, seeds, and beans. This information is not intended to replace advice given to you by your health care provider. Make sure you discuss any questions you have with your health care provider. Document Revised: 04/04/2020 Document Reviewed: 04/04/2020 Elsevier Patient Education  2021 Elsevier Inc.  

## 2021-01-21 LAB — IRON,TIBC AND FERRITIN PANEL
Ferritin: 115 ng/mL (ref 15–150)
Iron Saturation: 14 % — ABNORMAL LOW (ref 15–55)
Iron: 46 ug/dL (ref 27–139)
Total Iron Binding Capacity: 335 ug/dL (ref 250–450)
UIBC: 289 ug/dL (ref 118–369)

## 2021-01-21 LAB — B12 AND FOLATE PANEL
Folate: 20 ng/mL (ref >3.0)
Vitamin B-12: 435 pg/mL (ref 232–1245)

## 2021-01-22 ENCOUNTER — Telehealth: Payer: Self-pay

## 2021-01-22 NOTE — Telephone Encounter (Signed)
Attempted to call patient for PP appt on 01/23/21. No answer. Unable to leave message due to full mailbox.

## 2021-01-23 ENCOUNTER — Other Ambulatory Visit: Payer: Self-pay

## 2021-01-23 ENCOUNTER — Ambulatory Visit
Payer: Medicare PPO | Attending: Student in an Organized Health Care Education/Training Program | Admitting: Student in an Organized Health Care Education/Training Program

## 2021-01-23 DIAGNOSIS — G894 Chronic pain syndrome: Secondary | ICD-10-CM | POA: Diagnosis not present

## 2021-01-23 DIAGNOSIS — M5416 Radiculopathy, lumbar region: Secondary | ICD-10-CM | POA: Diagnosis not present

## 2021-01-23 DIAGNOSIS — G8929 Other chronic pain: Secondary | ICD-10-CM

## 2021-01-23 DIAGNOSIS — M961 Postlaminectomy syndrome, not elsewhere classified: Secondary | ICD-10-CM | POA: Diagnosis not present

## 2021-01-23 NOTE — Progress Notes (Signed)
Patient: Emily Landry  Service Category: E/M  Provider: Gillis Santa, MD  DOB: 07/24/1951  DOS: 01/23/2021  Location: Office  MRN: [id number]  Setting: Ambulatory outpatient  Referring Provider: Lesleigh Noe, MD  Type: Established Patient  Specialty: Interventional Pain Management  PCP: Lesleigh Noe, MD  Location: Home  Delivery: TeleHealth     Virtual Encounter - Pain Management PROVIDER NOTE: Information contained herein reflects review and annotations entered in association with encounter. Interpretation of such information and data should be left to medically-trained personnel. Information provided to patient can be located elsewhere in the medical record under "Patient Instructions". Document created using STT-dictation technology, any transcriptional errors that may result from process are unintentional.    Contact & Pharmacy Preferred: [phone] Home: [phone] (home) Mobile: [phone] (mobile) E-mail: [email or web address]  [facility] [id number] [place], [address] AT [address] [place] [address] Phone: [phone] Fax: [phone]   Pre-screening  [name] offered "in-person" vs "virtual" encounter. She indicated preferring virtual for this encounter.   Reason COVID-19*  Social distancing based on CDC and AMA recommendations.   I contacted Emily Landry on 01/23/2021 via video conference.      I clearly identified myself as Gillis Santa, MD. I verified that I was speaking with the correct person using two identifiers (Name: Emily Landry, and date of birth: 1951-11-26).  Consent I sought verbal advanced consent from Emily Landry for virtual visit interactions. I informed Emily Landry of possible security and privacy concerns, risks, and limitations associated with providing "not-in-person" medical evaluation and management services. I also informed Emily Landry of the availability of "in-person"  appointments. Finally, I informed her that there would be a charge for the virtual visit and that she could be  personally, fully or partially, financially responsible for it. Emily Landry expressed understanding and agreed to proceed.   Historic Elements   Emily Landry is a 70 y.o. year old, female patient evaluated today after our last contact on 01/06/2021. Emily Landry  has a past medical history of Anxiety, Arthritis, Depression, and GERD (gastroesophageal reflux disease). She also  has a past surgical history that includes Laminectomy (1971); Lumbar disc surgery (1991); Tonsillectomy; Cholecystectomy; and Abdominal hysterectomy. Emily Landry has a current medication list which includes the following prescription(s): duloxetine, folic acid, gabapentin, hydroxychloroquine sulfate, methotrexate, morphine, omeprazole, oxycodone-acetaminophen, prednisone, triamcinolone, and zolpidem. She  reports that she has never smoked. She has never used smokeless tobacco. She reports current alcohol use. She reports that she does not use drugs. Emily Landry has No Known Allergies.   HPI  Today, she is being contacted for a post-procedure assessment.    Post-Procedure Evaluation  Procedure (01/06/2021):   Type: Diagnostic Inter-Laminar Epidural Steroid Injection  #1  Region: Lumbar Level: L5-S1 Level. Laterality: Left-Sided    Sedation: Please see nurses note.  Effectiveness during initial hour after procedure(Ultra-Short Term Relief): 40 %   Local anesthetic used: Long-acting (4-6 hours) Effectiveness: Defined as any analgesic benefit obtained secondary to the administration of local anesthetics. This carries significant diagnostic value as to the etiological location, or anatomical origin, of the pain. Duration of benefit is expected to coincide with the duration of the local anesthetic used.  Effectiveness during initial 4-6 hours after procedure(Short-Term Relief): 40 %   Long-term benefit: Defined  as any relief past the pharmacologic duration of the local anesthetics.  Effectiveness past the initial 6 hours after procedure(Long-Term Relief): 40 % (slight pain  relief for approx 3 days.)   Current benefits: Defined as benefit that persist at this time.   Analgesia:  50% improved Function: Somewhat improved ROM: Somewhat improved    Laboratory Chemistry Profile   Renal Lab Results  Component Value Date   BUN 11 05/21/2020   CREATININE 0.6 05/21/2020     Hepatic Lab Results  Component Value Date   AST 31 05/21/2020   ALT 22 05/21/2020   ALBUMIN 4.2 05/21/2020   ALKPHOS 71 05/21/2020     Electrolytes Lab Results  Component Value Date   NA 142 05/21/2020   K 4.9 05/21/2020   CL 104 05/21/2020   CALCIUM 9.1 05/21/2020     Bone No results found for: VD25OH, VD125OH2TOT, YH8887NZ9, JK8206OR5, 25OHVITD1, 25OHVITD2, 25OHVITD3, TESTOFREE, TESTOSTERONE   Inflammation (CRP: Acute Phase) (ESR: Chronic Phase) Lab Results  Component Value Date   ESRSEDRATE 6 12/06/2019       Note: Above Lab results reviewed.   Assessment  The primary encounter diagnosis was Chronic radicular lumbar pain. Diagnoses of Lumbar radiculopathy, Failed back surgical syndrome, Lumbar post-laminectomy syndrome, and Chronic pain syndrome were also pertinent to this visit.  Plan of Care   Emily Landry has a current medication list which includes the following long-term medication(s): duloxetine, gabapentin, omeprazole, and zolpidem.  Orders:  Orders Placed This Encounter  Procedures  . Lumbar Epidural Injection    Standing Status:   Standing    Number of Occurrences:   9    Standing Expiration Date:   01/23/2022    Scheduling Instructions:     Purpose: Palliative     Indication: Lower extremity pain/Sciatica unspecified side (M54.30).     Side: Midline     Level: TBD     Sedation: Patient's choice.     TIMEFRAME: PRN procedure. (Emily Landry will call when needed.)    Order  Specific Question:   Where will this procedure be performed?    Answer:   [facility]   Follow-up plan:   Return if symptoms worsen or fail to improve.     Left L5-S1 ESI #1,  01/06/2021, 6 cc injected    Recent Visits Date Type Provider Dept  01/06/21 Procedure visit Gillis Santa, MD [facility]  Showing recent visits within past 90 days and meeting all other requirements Today's Visits Date Type Provider Dept  01/23/21 Telemedicine Gillis Santa, MD [facility]  Showing today's visits and meeting all other requirements Future Appointments No visits were found meeting these conditions. Showing future appointments within next 90 days and meeting all other requirements  I discussed the assessment and treatment plan with the patient. The patient was provided an opportunity to ask questions and all were answered. The patient agreed with the plan and demonstrated an understanding of the instructions.  Patient advised to call back or seek an in-person evaluation if the symptoms or condition worsens.  Duration of encounter: 15 minutes.  Note by: Gillis Santa, MD Date: 01/23/2021; Time: 1:13 PM

## 2021-01-27 ENCOUNTER — Encounter: Payer: Self-pay | Admitting: Gastroenterology

## 2021-02-03 ENCOUNTER — Other Ambulatory Visit: Payer: Self-pay | Admitting: *Deleted

## 2021-02-03 MED ORDER — DULOXETINE HCL 60 MG PO CPEP: 60.0000 mg | ORAL_CAPSULE | Freq: Every day | ORAL | 1 refills | Status: DC

## 2021-02-03 NOTE — Telephone Encounter (Signed)
Received fax asking Rx to be sent to mail order (can't xfer this type of med) since PCP filled med for a year on 10/09/20 will sent in Rx to mail order pharmacy

## 2021-02-04 ENCOUNTER — Encounter: Payer: Self-pay | Admitting: Student in an Organized Health Care Education/Training Program

## 2021-02-04 DIAGNOSIS — G894 Chronic pain syndrome: Secondary | ICD-10-CM

## 2021-02-04 DIAGNOSIS — M961 Postlaminectomy syndrome, not elsewhere classified: Secondary | ICD-10-CM

## 2021-02-04 DIAGNOSIS — G8929 Other chronic pain: Secondary | ICD-10-CM

## 2021-02-04 DIAGNOSIS — M5416 Radiculopathy, lumbar region: Secondary | ICD-10-CM

## 2021-02-05 DIAGNOSIS — M961 Postlaminectomy syndrome, not elsewhere classified: Secondary | ICD-10-CM | POA: Diagnosis not present

## 2021-02-05 DIAGNOSIS — M5412 Radiculopathy, cervical region: Secondary | ICD-10-CM | POA: Diagnosis not present

## 2021-02-05 DIAGNOSIS — M5417 Radiculopathy, lumbosacral region: Secondary | ICD-10-CM | POA: Diagnosis not present

## 2021-02-05 NOTE — Telephone Encounter (Signed)
Yes, I need to have inappropriate urine toxicology screen prior to taking over her controlled substances.  I placed an order for a repeat urine toxicology screen with the patient is able to come in and provide one.

## 2021-02-10 ENCOUNTER — Ambulatory Visit: Admit: 2021-02-10 | Payer: Medicare PPO | Admitting: Gastroenterology

## 2021-02-10 SURGERY — COLONOSCOPY WITH PROPOFOL
Anesthesia: General

## 2021-02-13 ENCOUNTER — Other Ambulatory Visit: Payer: Self-pay | Admitting: Student in an Organized Health Care Education/Training Program

## 2021-02-13 DIAGNOSIS — G894 Chronic pain syndrome: Secondary | ICD-10-CM | POA: Diagnosis not present

## 2021-02-17 DIAGNOSIS — M9903 Segmental and somatic dysfunction of lumbar region: Secondary | ICD-10-CM | POA: Diagnosis not present

## 2021-02-17 DIAGNOSIS — M542 Cervicalgia: Secondary | ICD-10-CM | POA: Diagnosis not present

## 2021-02-17 DIAGNOSIS — M25552 Pain in left hip: Secondary | ICD-10-CM | POA: Diagnosis not present

## 2021-02-17 DIAGNOSIS — M9902 Segmental and somatic dysfunction of thoracic region: Secondary | ICD-10-CM | POA: Diagnosis not present

## 2021-02-17 DIAGNOSIS — M9901 Segmental and somatic dysfunction of cervical region: Secondary | ICD-10-CM | POA: Diagnosis not present

## 2021-02-17 DIAGNOSIS — M5416 Radiculopathy, lumbar region: Secondary | ICD-10-CM | POA: Diagnosis not present

## 2021-02-17 DIAGNOSIS — M62838 Other muscle spasm: Secondary | ICD-10-CM | POA: Diagnosis not present

## 2021-02-23 ENCOUNTER — Encounter: Payer: Self-pay | Admitting: Student in an Organized Health Care Education/Training Program

## 2021-02-23 LAB — COMPLIANCE DRUG ANALYSIS, UR

## 2021-02-24 DIAGNOSIS — M62838 Other muscle spasm: Secondary | ICD-10-CM | POA: Diagnosis not present

## 2021-02-24 DIAGNOSIS — M25552 Pain in left hip: Secondary | ICD-10-CM | POA: Diagnosis not present

## 2021-02-24 DIAGNOSIS — M9903 Segmental and somatic dysfunction of lumbar region: Secondary | ICD-10-CM | POA: Diagnosis not present

## 2021-02-24 DIAGNOSIS — M5416 Radiculopathy, lumbar region: Secondary | ICD-10-CM | POA: Diagnosis not present

## 2021-02-24 DIAGNOSIS — M9902 Segmental and somatic dysfunction of thoracic region: Secondary | ICD-10-CM | POA: Diagnosis not present

## 2021-02-24 DIAGNOSIS — M9901 Segmental and somatic dysfunction of cervical region: Secondary | ICD-10-CM | POA: Diagnosis not present

## 2021-02-24 DIAGNOSIS — M542 Cervicalgia: Secondary | ICD-10-CM | POA: Diagnosis not present

## 2021-02-25 ENCOUNTER — Other Ambulatory Visit: Payer: Self-pay | Admitting: Student in an Organized Health Care Education/Training Program

## 2021-02-25 DIAGNOSIS — G894 Chronic pain syndrome: Secondary | ICD-10-CM | POA: Diagnosis not present

## 2021-03-03 ENCOUNTER — Encounter: Payer: Self-pay | Admitting: Family Medicine

## 2021-03-03 ENCOUNTER — Encounter: Payer: Self-pay | Admitting: Gastroenterology

## 2021-03-03 ENCOUNTER — Ambulatory Visit: Payer: Medicare PPO | Admitting: Family Medicine

## 2021-03-03 ENCOUNTER — Other Ambulatory Visit: Payer: Self-pay

## 2021-03-03 VITALS — BP 148/62 | HR 79 | Temp 97.6°F | Ht 69.0 in | Wt 223.0 lb

## 2021-03-03 DIAGNOSIS — I10 Essential (primary) hypertension: Secondary | ICD-10-CM

## 2021-03-03 DIAGNOSIS — G4700 Insomnia, unspecified: Secondary | ICD-10-CM | POA: Diagnosis not present

## 2021-03-03 DIAGNOSIS — I159 Secondary hypertension, unspecified: Secondary | ICD-10-CM | POA: Diagnosis not present

## 2021-03-03 DIAGNOSIS — F411 Generalized anxiety disorder: Secondary | ICD-10-CM | POA: Diagnosis not present

## 2021-03-03 DIAGNOSIS — F325 Major depressive disorder, single episode, in full remission: Secondary | ICD-10-CM | POA: Diagnosis not present

## 2021-03-03 HISTORY — DX: Essential (primary) hypertension: I10

## 2021-03-03 MED ORDER — ZOLPIDEM TARTRATE 5 MG PO TABS: 5.0000 mg | ORAL_TABLET | Freq: Every evening | ORAL | 0 refills | Status: DC | PRN

## 2021-03-03 MED ORDER — HYDROXYZINE HCL 50 MG PO TABS: 25.0000 mg | ORAL_TABLET | Freq: Three times a day (TID) | ORAL | 0 refills | Status: DC | PRN

## 2021-03-03 MED ORDER — BUSPIRONE HCL 7.5 MG PO TABS: 7.5000 mg | ORAL_TABLET | Freq: Two times a day (BID) | ORAL | 1 refills | Status: DC

## 2021-03-03 NOTE — Assessment & Plan Note (Addendum)
Worsening anxiety and sleep recently. Discussed starting buspar 7.5 mg bid ok to increase up to 15 mg bid if needed. Return 6 weeks. She will reach out to a therapist. Trial of prn hydroxyzine.

## 2021-03-03 NOTE — Assessment & Plan Note (Signed)
Worsening with more stress and running out of chronic pain medication. Cont duloxetine 60 mg. Start buspar 7.5 mg bid

## 2021-03-03 NOTE — Assessment & Plan Note (Signed)
Needing more due to increased stress. Responding well to [place]. Refill #30 provided.

## 2021-03-03 NOTE — Assessment & Plan Note (Signed)
Suspect some of this may be related to withdrawal from opiates. Will monitor. Treat anxiety.

## 2021-03-03 NOTE — Progress Notes (Signed)
Subjective:     Emily Landry is a 70 y.o. female presenting for Anxiety (Increased symptoms having trouble sleeping would like to start medications. )     HPI   #Anxiety - has always had trouble with this - is on medication for RA - husband having some difficulties  - is on cymbalta - replaced a previous anti-depressant medication - has been taking ambien every day - will occasionally get panic attacks but these are rare  #chronic pain - is working with a pain specialist - had a positive urine test  - no longer taking her THC supplement - has been weaning off her opiate medication - in a lot of pain    Review of Systems   Social History   Tobacco Use  Smoking Status Never Smoker  Smokeless Tobacco Never Used        Objective:    BP Readings from Last 3 Encounters:  03/03/21 (!) 148/62  01/20/21 (!) 144/85  01/06/21 (!) 159/87   Wt Readings from Last 3 Encounters:  03/03/21 223 lb (101.2 kg)  01/20/21 220 lb 4 oz (99.9 kg)  01/06/21 205 lb (93 kg)    BP (!) 148/62   Pulse 79   Temp 97.6 F (36.4 C) (Temporal)   Ht 5\' 9"  (1.753 m)   Wt 223 lb (101.2 kg)   SpO2 98%   BMI 32.93 kg/m    Physical Exam Constitutional:      General: She is not in acute distress.    Appearance: She is well-developed. She is not diaphoretic.  HENT:     Right Ear: External ear normal.     Left Ear: External ear normal.     Nose: Nose normal.  Eyes:     Conjunctiva/sclera: Conjunctivae normal.  Cardiovascular:     Rate and Rhythm: Normal rate.  Pulmonary:     Effort: Pulmonary effort is normal.  Musculoskeletal:     Cervical back: Neck supple.  Skin:    General: Skin is warm and dry.     Capillary Refill: Capillary refill takes less than 2 seconds.  Neurological:     Mental Status: She is alert. Mental status is at baseline.  Psychiatric:        Mood and Affect: Mood normal.        Behavior: Behavior normal.     Depression screen [facility] 2/9 03/03/2021  01/06/2021 10/03/2020 07/25/2020  Decreased Interest 3 0 0 0  Down, Depressed, Hopeless 2 0 0 1  PHQ - 2 Score 5 0 0 1  Altered sleeping 3 - - 3  Tired, decreased energy 2 - - 2  Change in appetite 2 - - 0  Feeling bad or failure about yourself  2 - - 1  Trouble concentrating 1 - - 2  Moving slowly or fidgety/restless 0 - - 2  Suicidal thoughts 0 - - 0  PHQ-9 Score 15 - - 11  Difficult doing work/chores Somewhat difficult - - Somewhat difficult   GAD 7 : Generalized Anxiety Score 03/03/2021  Nervous, Anxious, on Edge 3  Control/stop worrying 3  Worry too much - different things 3  Trouble relaxing 3  Restless 3  Easily annoyed or irritable 3  Afraid - awful might happen 3  Total GAD 7 Score 21  Anxiety Difficulty Very difficult          Assessment & Plan:   Problem List Items Addressed This Visit      Cardiovascular and Mediastinum  Hypertension    Suspect some of this may be related to withdrawal from opiates. Will monitor. Treat anxiety.         Other   Insomnia    Needing more due to increased stress. Responding well to [place]. Refill #30 provided.       Relevant Medications   zolpidem (AMBIEN) 5 MG tablet   Major depressive disorder, single episode, in remission (HCC) - Primary    Worsening with more stress and running out of chronic pain medication. Cont duloxetine 60 mg. Start buspar 7.5 mg bid      Relevant Medications   busPIRone (BUSPAR) 7.5 MG tablet   hydrOXYzine (ATARAX/VISTARIL) 50 MG tablet   Generalized anxiety disorder    Worsening anxiety and sleep recently. Discussed starting buspar 7.5 mg bid ok to increase up to 15 mg bid if needed. Return 6 weeks. She will reach out to a therapist. Trial of prn hydroxyzine.       Relevant Medications   busPIRone (BUSPAR) 7.5 MG tablet   hydrOXYzine (ATARAX/VISTARIL) 50 MG tablet       Return in about 6 weeks (around 04/14/2021) for anxiety.  Lynnda Child, MD  This visit occurred during the  SARS-CoV-2 public health emergency.  Safety protocols were in place, including screening questions prior to the visit, additional usage of staff PPE, and extensive cleaning of exam room while observing appropriate contact time as indicated for disinfecting solutions.

## 2021-03-03 NOTE — Patient Instructions (Addendum)
#  Anxiety - start Buspar 7.5 mg twice daily - If no change after 1 week can increase as follows 1) Take 1.5 tablets in the morning, and 1 tablet in the evening 2) after 3-4 days, take 1.5 tablets twice daily  If no change after 2-3 weeks 3) Take 2 tablets in the morning, 1.5 tablet in the evening 4) after 3-4 days take 2 tablets twice daily  Sleep - can try hydroxyzine - ambien refill

## 2021-03-04 ENCOUNTER — Encounter: Payer: Self-pay | Admitting: Family Medicine

## 2021-03-06 ENCOUNTER — Encounter: Payer: Self-pay | Admitting: Student in an Organized Health Care Education/Training Program

## 2021-03-06 ENCOUNTER — Telehealth: Payer: Self-pay | Admitting: *Deleted

## 2021-03-06 ENCOUNTER — Ambulatory Visit: Payer: Medicare PPO | Admitting: Family Medicine

## 2021-03-06 ENCOUNTER — Other Ambulatory Visit: Payer: Self-pay | Admitting: Student in an Organized Health Care Education/Training Program

## 2021-03-06 DIAGNOSIS — G894 Chronic pain syndrome: Secondary | ICD-10-CM | POA: Diagnosis not present

## 2021-03-06 LAB — COMPLIANCE DRUG ANALYSIS, UR

## 2021-03-06 NOTE — Telephone Encounter (Signed)
Called patient back after I spoke with Dr. Cherylann Ratel about what to tell patient. He has ordered another UDS. I called patient and informed her of the POC and she understands that the UDS must be clean AND she understands that if Dr. Cherylann Ratel prescribes it will be a a much lower dose than she was taking at previous Pain Clinic. Patient states that she understands and will be by to do the UDS. Clinic hours discussed.

## 2021-03-10 DIAGNOSIS — M9902 Segmental and somatic dysfunction of thoracic region: Secondary | ICD-10-CM | POA: Diagnosis not present

## 2021-03-10 DIAGNOSIS — M5416 Radiculopathy, lumbar region: Secondary | ICD-10-CM | POA: Diagnosis not present

## 2021-03-10 DIAGNOSIS — M542 Cervicalgia: Secondary | ICD-10-CM | POA: Diagnosis not present

## 2021-03-10 DIAGNOSIS — M25552 Pain in left hip: Secondary | ICD-10-CM | POA: Diagnosis not present

## 2021-03-10 DIAGNOSIS — M9903 Segmental and somatic dysfunction of lumbar region: Secondary | ICD-10-CM | POA: Diagnosis not present

## 2021-03-10 DIAGNOSIS — M62838 Other muscle spasm: Secondary | ICD-10-CM | POA: Diagnosis not present

## 2021-03-10 DIAGNOSIS — M9901 Segmental and somatic dysfunction of cervical region: Secondary | ICD-10-CM | POA: Diagnosis not present

## 2021-03-11 ENCOUNTER — Encounter (INDEPENDENT_AMBULATORY_CARE_PROVIDER_SITE_OTHER): Payer: Self-pay

## 2021-03-12 ENCOUNTER — Encounter: Payer: Self-pay | Admitting: Family Medicine

## 2021-03-12 LAB — TOXASSURE SELECT 13 (MW), URINE

## 2021-03-13 NOTE — Telephone Encounter (Signed)
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 This is Day 5. Yesterday she had a terrible headache. Today, headache is better but BP 211/113 and HR 90 to 116. Pain management has not provided her with anything to help with the withdrawal symptoms. Asking if Dr Selena Batten can help. She has elevated anxiety already. This is not helping. Has thought about going to get the hydroxyzine to see if that helps. She uses [facility] in [place] if Dr Selena Batten wants to send something else in.   She is calling [facility] now to insist they give her the hydroxyzine. I advised her if she continues to not do well while she is waiting to hear back from a provider, to please go to the ER. She said she would.

## 2021-03-13 NOTE — Telephone Encounter (Signed)
Agree with advice about ER.

## 2021-03-13 NOTE — Telephone Encounter (Signed)
Also agree with ER and trying hydroxyzine.   With blood pressure would recommend evaluation.

## 2021-03-14 DIAGNOSIS — M542 Cervicalgia: Secondary | ICD-10-CM | POA: Diagnosis not present

## 2021-03-14 DIAGNOSIS — M9902 Segmental and somatic dysfunction of thoracic region: Secondary | ICD-10-CM | POA: Diagnosis not present

## 2021-03-14 DIAGNOSIS — M5416 Radiculopathy, lumbar region: Secondary | ICD-10-CM | POA: Diagnosis not present

## 2021-03-14 DIAGNOSIS — M25552 Pain in left hip: Secondary | ICD-10-CM | POA: Diagnosis not present

## 2021-03-14 DIAGNOSIS — M62838 Other muscle spasm: Secondary | ICD-10-CM | POA: Diagnosis not present

## 2021-03-14 DIAGNOSIS — M9903 Segmental and somatic dysfunction of lumbar region: Secondary | ICD-10-CM | POA: Diagnosis not present

## 2021-03-14 DIAGNOSIS — M9901 Segmental and somatic dysfunction of cervical region: Secondary | ICD-10-CM | POA: Diagnosis not present

## 2021-03-18 DIAGNOSIS — M0609 Rheumatoid arthritis without rheumatoid factor, multiple sites: Secondary | ICD-10-CM | POA: Diagnosis not present

## 2021-03-18 DIAGNOSIS — Z79899 Other long term (current) drug therapy: Secondary | ICD-10-CM | POA: Diagnosis not present

## 2021-03-18 DIAGNOSIS — M19041 Primary osteoarthritis, right hand: Secondary | ICD-10-CM | POA: Diagnosis not present

## 2021-03-18 DIAGNOSIS — M19042 Primary osteoarthritis, left hand: Secondary | ICD-10-CM | POA: Diagnosis not present

## 2021-03-20 ENCOUNTER — Encounter: Payer: Self-pay | Admitting: Student in an Organized Health Care Education/Training Program

## 2021-03-20 ENCOUNTER — Other Ambulatory Visit: Payer: Self-pay

## 2021-03-20 ENCOUNTER — Ambulatory Visit
Payer: Medicare PPO | Attending: Student in an Organized Health Care Education/Training Program | Admitting: Student in an Organized Health Care Education/Training Program

## 2021-03-20 VITALS — BP 153/85 | HR 90 | Temp 100.0°F | Resp 16 | Ht 69.0 in | Wt 215.0 lb

## 2021-03-20 DIAGNOSIS — Z0289 Encounter for other administrative examinations: Secondary | ICD-10-CM | POA: Insufficient documentation

## 2021-03-20 DIAGNOSIS — M5136 Other intervertebral disc degeneration, lumbar region: Secondary | ICD-10-CM | POA: Diagnosis not present

## 2021-03-20 DIAGNOSIS — F4310 Post-traumatic stress disorder, unspecified: Secondary | ICD-10-CM | POA: Insufficient documentation

## 2021-03-20 DIAGNOSIS — M25512 Pain in left shoulder: Secondary | ICD-10-CM | POA: Diagnosis not present

## 2021-03-20 DIAGNOSIS — M5416 Radiculopathy, lumbar region: Secondary | ICD-10-CM | POA: Insufficient documentation

## 2021-03-20 DIAGNOSIS — M25511 Pain in right shoulder: Secondary | ICD-10-CM | POA: Insufficient documentation

## 2021-03-20 DIAGNOSIS — Z79891 Long term (current) use of opiate analgesic: Secondary | ICD-10-CM | POA: Insufficient documentation

## 2021-03-20 DIAGNOSIS — M961 Postlaminectomy syndrome, not elsewhere classified: Secondary | ICD-10-CM | POA: Diagnosis not present

## 2021-03-20 DIAGNOSIS — G8929 Other chronic pain: Secondary | ICD-10-CM | POA: Insufficient documentation

## 2021-03-20 DIAGNOSIS — G894 Chronic pain syndrome: Secondary | ICD-10-CM | POA: Insufficient documentation

## 2021-03-20 MED ORDER — GABAPENTIN 400 MG PO CAPS: 400.0000 mg | ORAL_CAPSULE | Freq: Three times a day (TID) | ORAL | 1 refills | Status: DC

## 2021-03-20 MED ORDER — OXYCODONE-ACETAMINOPHEN 10-325 MG PO TABS
1.0000 | ORAL_TABLET | Freq: Two times a day (BID) | ORAL | 0 refills | Status: DC | PRN
Start: 2021-04-19 — End: 2021-04-29

## 2021-03-20 MED ORDER — OXYCODONE-ACETAMINOPHEN 10-325 MG PO TABS: 1.0000 | ORAL_TABLET | Freq: Two times a day (BID) | ORAL | 0 refills | Status: AC | PRN

## 2021-03-20 MED ORDER — MORPHINE SULFATE ER 15 MG PO TBCR: 15.0000 mg | EXTENDED_RELEASE_TABLET | Freq: Two times a day (BID) | ORAL | 0 refills | Status: DC

## 2021-03-20 MED ORDER — MORPHINE SULFATE ER 15 MG PO TBCR: 15.0000 mg | EXTENDED_RELEASE_TABLET | Freq: Two times a day (BID) | ORAL | 0 refills | Status: AC

## 2021-03-20 NOTE — Patient Instructions (Addendum)
Sign pain contract  Morphine and oxycodone to last until 05/19/21 and gabapentin have been escribed to your pharmacy.  You will have appt with psych and 12 lead EKG before next appt with pain clinic

## 2021-03-20 NOTE — Progress Notes (Signed)
Safety precautions to be maintained throughout the outpatient stay will include: orient to surroundings, keep bed in low position, maintain call bell within reach at all times, provide assistance with transfer out of bed and ambulation.  

## 2021-03-20 NOTE — Progress Notes (Signed)
PROVIDER NOTE: Information contained herein reflects review and annotations entered in association with encounter. Interpretation of such information and data should be left to medically-trained personnel. Information provided to patient can be located elsewhere in the medical record under "Patient Instructions". Document created using STT-dictation technology, any transcriptional errors that may result from process are unintentional.    Patient: Emily Landry  Service Category: E/M  Provider: Gillis Santa, MD  DOB: 07/17/1951  DOS: 03/20/2021  Specialty: Interventional Pain Management  MRN: [id number]  Setting: Ambulatory outpatient  PCP: Lesleigh Noe, MD  Type: Established Patient    Referring Provider: Lesleigh Noe, MD  Location: Office  Delivery: Face-to-face     Primary Reason(s) for Visit: Encounter for evaluation before starting new chronic pain management plan of care (Level of risk: moderate) CC: Back Pain (Mid back to legs)  HPI  [name] is a 70 y.o. year old, female patient, who comes today for a follow-up evaluation to review the test results and decide on a treatment plan. She has Seronegative rheumatoid arthritis ([place]); Insomnia; Hearing difficulty; Chronic pain syndrome; Chronic back pain; Chronic neck pain; Chronic constipation; Osteopenia; Major depressive disorder, single episode, in remission ([place]); GERD (gastroesophageal reflux disease); Failed back surgical syndrome; Chronic radicular lumbar pain; Other intervertebral disc degeneration, lumbar region; Encounter for long-term opiate analgesic use; Primary osteoarthritis of both hands; Generalized anxiety disorder; Hypertension; Chronic pain of both shoulders; and Pain management contract signed on their problem list. Her primarily concern today is the Back Pain (Mid back to legs)  Pain Assessment: Location: Mid Back Radiating: down to feet to include some toes Onset: More than a month ago Duration: Chronic pain Quality:  Constant Severity: 8 /10 (subjective, self-reported pain score)  Effect on ADL: limits daily activities Timing: Constant Modifying factors: reclining BP: (!) 153/85  HR: 90  [name] comes in today for a follow-up visit.  Patient is in increased pain and is not doing well.  She has been without her opioid analgesics for the last 5 days.  She has been tachycardic and hypertensive.  She is likely  having opioid withdrawal symptoms.  She has completed a urine toxicology screen which is appropriate.  It is negative for THC.  As I indicated in the initial clinic note that if I were to take the patient on for chronic opioid therapy that we would wean her opioid analgesics.  We will reduce her morphine to 15 mg twice a day and oxycodone to 10 mg twice a day as needed for breakthrough pain.  She has been utilizing gabapentin 300 mg 3 times a day which she does find beneficial.  We discussed dose escalation to 400 mg 3 times a day.  We also discussed therapeutic options such as lidocaine infusion as well as spinal cord stimulator trial for failed back surgical syndrome, chronic lower extremity neuropathic pain.  I will provide the patient resources regarding spinal cord stimulation.  I will also obtain an EKG in the event we need to consider lidocaine infusion in the future.  Patient states that she has a history of depression and PTSD.  I will place a referral to Dr. Toy Care.  The patient has tried various antidepressants in the past with limited response.  She could be a candidate for Spravato.   Controlled Substance Pharmacotherapy Assessment REMS (Risk Evaluation and Mitigation Strategy)  Analgesic: Morphine 15 mg twice daily, MME equals 30; Percocet 10 mg twice daily as needed for breakthrough pain,; MME equals 30.  Total MME equals 60  Pill Count: None expected due to no prior prescriptions written by our practice. Rise Patience, RN  03/20/2021 12:48 PM  Sign when Signing Visit Safety precautions to  be maintained throughout the outpatient stay will include: orient to surroundings, keep bed in low position, maintain call bell within reach at all times, provide assistance with transfer out of bed and ambulation.    Pharmacokinetics: Liberation and absorption (onset of action): WNL Distribution (time to peak effect): WNL Metabolism and excretion (duration of action): WNL         Pharmacodynamics: Desired effects: Analgesia: Emily Landry reports >50% benefit. Functional ability: Patient reports that medication allows her to accomplish basic ADLs Clinically meaningful improvement in function (CMIF): Sustained CMIF goals met Perceived effectiveness: Described as relatively effective, allowing for increase in activities of daily living (ADL) Undesirable effects: Side-effects or Adverse reactions: None reported Monitoring: [place] PMP: PDMP not reviewed this encounter. Online review of the past 10-monthperiod previously conducted. Not applicable at this point since we have not taken over the patient's medication management yet. List of other Serum/Urine Drug Screening Test(s):  No results found for: AMPHSCRSER, BARBSCRSER, BENZOSCRSER, COCAINSCRSER, COCAINSCRNUR, PCPSCRSER, THCSCRSER, THCU, CANNABQUANT, OPIATESCRSER, O[place] PCape [place] EVianList of all UDS test(s) done:  Lab Results  Component Value Date   SUMMARY Note 03/06/2021   SUMMARY Note 02/25/2021   SUMMARY Note 02/13/2021   SUMMARY Note 10/03/2020   Last UDS on record: Summary  Date Value Ref Range Status  03/06/2021 Note  Final    Comment:    ==================================================================== [employer] Select 13 (MW) ==================================================================== Test                             Result       Flag       Units  Drug Present and Declared for Prescription Verification   Oxycodone                      1031         EXPECTED   ng/mg creat   Oxymorphone                     760          EXPECTED   ng/mg creat   Noroxycodone                   4697         EXPECTED   ng/mg creat   Noroxymorphone                 277          EXPECTED   ng/mg creat    Sources of oxycodone are scheduled prescription medications.    Oxymorphone, noroxycodone, and noroxymorphone are expected    metabolites of oxycodone. Oxymorphone is also available as a    scheduled prescription medication.  Drug Present not Declared for Prescription Verification   Morphine                       10231        UNEXPECTED ng/mg creat   Normorphine                    549          UNEXPECTED ng/mg creat    Potential sources of large amounts of morphine in the  absence of    codeine include administration of morphine or use of heroin.     Normorphine is an expected metabolite of morphine.  ==================================================================== Test                      Result    Flag   Units      Ref Range   Creatinine              35               mg/dL      >=20 ==================================================================== Declared Medications:  The flagging and interpretation on this report are based on the  following declared medications.  Unexpected results may arise from  inaccuracies in the declared medications.   **Note: The testing scope of this panel includes these medications:   Oxycodone (Percocet)   **Note: The testing scope of this panel does not include the  following reported medications:   Acetaminophen (Percocet)  Buspirone (Buspar)  Duloxetine (Cymbalta)  Folic Acid  Gabapentin (Neurontin)  Hydroxychloroquine  Hydroxyzine (Atarax)  Methotrexate  Omeprazole (Prilosec)  Prednisone (Deltasone)  Triamcinolone (Kenalog)  Zolpidem (Ambien) ==================================================================== For clinical consultation, please call [phone]. ====================================================================    UDS  interpretation: No unexpected findings.          Medication Assessment Form: Patient introduced to form today Treatment compliance: Treatment may start today if patient agrees with proposed plan. Evaluation of compliance is not applicable at this point Risk Assessment Profile: Aberrant behavior: See initial evaluations. None observed or detected today Comorbid factors increasing risk of overdose: See initial evaluation. No additional risks detected today Opioid risk tool (ORT):  Opioid Risk  03/20/2021  Alcohol 1  Illegal Drugs 0  Rx Drugs 4  Alcohol 0  Illegal Drugs 0  Rx Drugs 0  Age between 16-45 years  -  History of Preadolescent Sexual Abuse -  Psychological Disease 0  Depression 1  Opioid Risk Tool Scoring 6  Opioid Risk Interpretation Moderate Risk    ORT Scoring interpretation table:  Score <3 = Low Risk for SUD  Score between 4-7 = Moderate Risk for SUD  Score >8 = High Risk for Opioid Abuse   Risk of substance use disorder (SUD): Low  Risk Mitigation Strategies:  Patient opioid safety counseling: Completed today. Counseling provided to patient as per "Patient Counseling Document". Document signed by patient, attesting to counseling and understanding Patient-Prescriber Agreement (PPA): Obtained today.  Controlled substance notification to other providers: Written and sent today.  Pharmacologic Plan: Today we may be taking over the patient's pharmacological regimen. See below.             Laboratory Chemistry Profile   Renal Lab Results  Component Value Date   BUN 11 05/21/2020   CREATININE 0.6 05/21/2020     Electrolytes Lab Results  Component Value Date   NA 142 05/21/2020   K 4.9 05/21/2020   CL 104 05/21/2020   CALCIUM 9.1 05/21/2020     Hepatic Lab Results  Component Value Date   AST 31 05/21/2020   ALT 22 05/21/2020   ALBUMIN 4.2 05/21/2020   ALKPHOS 71 05/21/2020     ID No results found for: LYMEIGGIGMAB, HIV, SARSCOV2NAA, STAPHAUREUS,  MRSAPCR, HCVAB, PREGTESTUR, RMSFIGG, QFVRPH1IGG, QFVRPH2IGG, LYMEIGGIGMAB   Bone No results found for: VD25OH, TM546TK3TWS, G2877219, FK8127NT7, 25OHVITD1, 25OHVITD2, 25OHVITD3, TESTOFREE, TESTOSTERONE   Endocrine Lab Results  Component Value Date   TSH 1.81 05/21/2020  Neuropathy Lab Results  Component Value Date   VITAMINB12 435 01/20/2021   FOLATE 20.0 01/20/2021     CNS No results found for: COLORCSF, APPEARCSF, RBCCOUNTCSF, WBCCSF, POLYSCSF, LYMPHSCSF, EOSCSF, PROTEINCSF, GLUCCSF, JCVIRUS, CSFOLI, IGGCSF, LABACHR, ACETBL, LABACHR, ACETBL   Inflammation (CRP: Acute  ESR: Chronic) Lab Results  Component Value Date   ESRSEDRATE 6 12/06/2019     Rheumatology No results found for: RF, ANA, LABURIC, URICUR, LYMEIGGIGMAB, LYMEABIGMQN, HLAB27   Coagulation Lab Results  Component Value Date   PLT 222 05/21/2020     Cardiovascular Lab Results  Component Value Date   HGB 13.3 05/21/2020   HCT 41 05/21/2020     Screening No results found for: SARSCOV2NAA, COVIDSOURCE, STAPHAUREUS, MRSAPCR, HCVAB, HIV, PREGTESTUR   Cancer No results found for: CEA, CA125, LABCA2   Allergens No results found for: ALMOND, APPLE, ASPARAGUS, AVOCADO, BANANA, BARLEY, BASIL, BAYLEAF, GREENBEAN, LIMABEAN, WHITEBEAN, BEEFIGE, REDBEET, BLUEBERRY, BROCCOLI, CABBAGE, MELON, CARROT, CASEIN, CASHEWNUT, CAULIFLOWER, CELERY     Note: Lab results reviewed.  Recent Diagnostic Imaging Review   Thoracic Imaging: Thoracic MR wo contrast: Results for orders placed during the hospital encounter of 11/02/20  MR THORACIC SPINE WO CONTRAST  Narrative CLINICAL DATA:  Mid back pain.  Spinal cord stimulator planning  EXAM: MRI THORACIC SPINE WITHOUT CONTRAST  TECHNIQUE: Multiplanar, multisequence MR imaging of the thoracic spine was performed. No intravenous contrast was administered.  COMPARISON:  None.  FINDINGS: Alignment:  Normal at the thoracic levels.  Vertebrae: Minor discogenic  endplate edema seen at A4-1. Chronic superior endplate depression at Y60. No evidence of bone lesion or discitis.  Cord: Normal signal and morphology. No evidence of intrathecal collection or mass.  Paraspinal and other soft tissues: Negative. Unremarkable posterior soft tissues.  Disc levels:  Generalized disc desiccation and narrowing with a few small disc protrusions including at T3-4, T6-7, and T7-8. No notable facet arthritis or ligamentum flavum thickening.  IMPRESSION: Ordinary degenerative changes. Diffusely patent spinal canal with no notable posterior element spurring or ligamentous thickening.   Electronically Signed By: Monte Fantasia M.D. On: 11/03/2020 09:41  Lumbosacral Imaging: Lumbar MR wo contrast: Results for orders placed during the hospital encounter of 11/02/20  MR LUMBAR SPINE WO CONTRAST  Narrative CLINICAL DATA:  Lumbosacral osteoarthritis. Low back pain for over 6 weeks with radiculopathy.  EXAM: MRI LUMBAR SPINE WITHOUT CONTRAST  TECHNIQUE: Multiplanar, multisequence MR imaging of the lumbar spine was performed. No intravenous contrast was administered.  COMPARISON:  None.  FINDINGS: Segmentation:  Standard lumbar numbering  Alignment:  Mild dextroscoliosis  Vertebrae: Discogenic endplate edema at Y3-K1 and to a lesser extent at L4-5 and L3-4. No evidence of fracture or discitis. No focal bone lesion.  Conus medullaris and cauda equina: Conus extends to the T12-L1 level. Conus and cauda equina appear normal.  Paraspinal and other soft tissues: Negative  Disc levels:  L2-L3: Disc narrowing and bulging. Degenerative facet spurring and ligamentum flavum thickening. Moderate left foraminal stenosis. Left subarticular recess stenosis with left L3 impingement. Spinal stenosis is overall moderate  L3-L4: Disc narrowing and bulging eccentric to the right where there is a downward pointing paracentral protrusion impinging on the  right L4 nerve root. Mild to moderate right foraminal narrowing. Overall moderate spinal stenosis  L4-L5: Disc narrowing and bulging with endplate degeneration asymmetric to the right. Degenerative facet spurring on both sides. Advanced right foraminal stenosis with L4 root flattening. Moderate to advanced spinal stenosis with right more than left subarticular recess impingement  L5-S1:Disc narrowing and bulging with right paracentral to foraminal protrusion impinging on the right L5 and descending S1 nerve roots. Mild or moderate left foraminal narrowing.  IMPRESSION: 1. Degenerative disease at L2-3 and below with dextroscoliosis. 2. L2-3 moderate spinal stenosis with left L3 impingement in the subarticular recess. Left foraminal narrowing is moderate. 3. L3-4 moderate spinal stenosis with asymmetric right L4 impingement at the subarticular recess. 4. L4-5 moderate to advanced spinal stenosis with right more than left L5 impingement at the subarticular recesses. Advanced right foraminal impingement. 5. L5-S1 advanced right foraminal and subarticular recess impingement.   Electronically Signed By: Monte Fantasia M.D. On: 11/03/2020 09:39  Complexity Note: Imaging results reviewed. Results shared with Ms. Stanislawski, using [employer].                         Meds   Current Outpatient Medications:  .  busPIRone (BUSPAR) 7.5 MG tablet, Take 1 tablet (7.5 mg total) by mouth 2 (two) times daily., Disp: 60 tablet, Rfl: 1 .  DULoxetine (CYMBALTA) 60 MG capsule, Take 1 capsule (60 mg total) by mouth daily., Disp: 90 capsule, Rfl: 1 .  folic acid (FOLVITE) 1 MG tablet, , Disp: , Rfl:  .  gabapentin (NEURONTIN) 400 MG capsule, Take 1 capsule (400 mg total) by mouth every 8 (eight) hours., Disp: 90 capsule, Rfl: 1 .  Hydroxychloroquine Sulfate 100 MG TABS, , Disp: , Rfl:  .  methotrexate (RHEUMATREX) 2.5 MG tablet, Take by mouth., Disp: , Rfl:  .  morphine (MS CONTIN) 15 MG 12 hr  tablet, Take 1 tablet (15 mg total) by mouth every 12 (twelve) hours. Must last 30 days., Disp: 60 tablet, Rfl: 0 .  [START ON 04/19/2021] morphine (MS CONTIN) 15 MG 12 hr tablet, Take 1 tablet (15 mg total) by mouth every 12 (twelve) hours. Must last 30 days., Disp: 60 tablet, Rfl: 0 .  omeprazole (PRILOSEC) 40 MG capsule, Take 1 capsule (40 mg total) by mouth daily before breakfast., Disp: 30 capsule, Rfl: 2 .  oxyCODONE-acetaminophen (PERCOCET) 10-325 MG tablet, Take 1 tablet by mouth every 12 (twelve) hours as needed for pain (breakthrough pain)., Disp: 60 tablet, Rfl: 0 .  [START ON 04/19/2021] oxyCODONE-acetaminophen (PERCOCET) 10-325 MG tablet, Take 1 tablet by mouth every 12 (twelve) hours as needed for pain (breakthrough pain)., Disp: 60 tablet, Rfl: 0 .  predniSONE (DELTASONE) 5 MG tablet, Take 5 mg by mouth daily with breakfast. , Disp: , Rfl:  .  triamcinolone cream (KENALOG) 0.1 %, , Disp: , Rfl:  .  zolpidem (AMBIEN) 5 MG tablet, Take 1 tablet (5 mg total) by mouth at bedtime as needed for sleep., Disp: 30 tablet, Rfl: 0  ROS  Constitutional: Denies any fever or chills Gastrointestinal: No reported hemesis, hematochezia, vomiting, or acute GI distress Musculoskeletal: Low back, bilateral leg pain Neurological: No reported episodes of acute onset apraxia, aphasia, dysarthria, agnosia, amnesia, paralysis, loss of coordination, or loss of consciousness  Allergies  Ms. Laffoon has No Known Allergies.  PFSH  Drug: Ms. Flud  reports no history of drug use. Alcohol:  reports current alcohol use. Tobacco:  reports that she has never smoked. She has never used smokeless tobacco. Medical:  has a past medical history of Anxiety, Arthritis, Depression, and GERD (gastroesophageal reflux disease). Surgical: Ms. Monica  has a past surgical history that includes Laminectomy (1971); Lumbar disc surgery (1991); Tonsillectomy; Cholecystectomy; and Abdominal hysterectomy. Family: family history  includes Alcohol  abuse in her father; Arthritis in her father; Breast cancer (age of onset: 72) in her mother; Diabetes in her mother; Gout in her father; Rheum arthritis in her mother.  Constitutional Exam  General appearance: Well nourished, well developed, and well hydrated. In no apparent acute distress Vitals:   03/20/21 1247  BP: (!) 153/85  Pulse: 90  Resp: 16  Temp: 100 F (37.8 C)  SpO2: 100%  Weight: 215 lb (97.5 kg)  Height: _0  (1.753 m)   BMI Assessment: Estimated body mass index is 31.75 kg/m as calculated from the following:   Height as of this encounter: _1  (1.753 m).   Weight as of this encounter: 215 lb (97.5 kg).  BMI interpretation table: BMI level Category Range association with higher incidence of chronic pain  <18 kg/m2 Underweight   18.5-24.9 kg/m2 Ideal body weight   25-29.9 kg/m2 Overweight Increased incidence by 20%  30-34.9 kg/m2 Obese (Class I) Increased incidence by 68%  35-39.9 kg/m2 Severe obesity (Class II) Increased incidence by 136%  >40 kg/m2 Extreme obesity (Class III) Increased incidence by 254%   Patient's current BMI Ideal Body weight  Body mass index is 31.75 kg/m. Ideal body weight: 66.2 kg (145 lb 15.1 oz) Adjusted ideal body weight: 78.7 kg (173 lb 9.1 oz)   BMI Readings from Last 4 Encounters:  03/20/21 31.75 kg/m  03/03/21 32.93 kg/m  01/20/21 32.53 kg/m  01/06/21 30.27 kg/m   Wt Readings from Last 4 Encounters:  03/20/21 215 lb (97.5 kg)  03/03/21 223 lb (101.2 kg)  01/20/21 220 lb 4 oz (99.9 kg)  01/06/21 205 lb (93 kg)    Psych/Mental status: Alert, oriented x 3 (person, place, & time)       Eyes: PERLA Respiratory: No evidence of acute respiratory distress  Lumbar Exam  Skin & Axial Inspection: Well healed scar from previous spine surgery detected Alignment: Symmetrical Functional ROM: Pain restricted ROM affecting both sides Stability: No instability detected Muscle Tone/Strength: Functionally intact.  No obvious neuro-muscular anomalies detected. Sensory (Neurological): Dermatomal pain pattern Palpation: No palpable anomalies       Provocative Tests: Hyperextension/rotation test: deferred today       Lumbar quadrant test (Kemp's test): deferred today       Lateral bending test: (+) ipsilateral radicular pain, bilaterally. Positive for bilateral foraminal stenosis. Patrick's Maneuver: (+) due to pain              Gait & Posture Assessment  Ambulation: Unassisted Gait: Relatively normal for age and body habitus Posture: WNL   Lower Extremity Exam    Side: Right lower extremity  Side: Left lower extremity  Stability: No instability observed          Stability: No instability observed          Skin & Extremity Inspection: Skin color, temperature, and hair growth are WNL. No peripheral edema or cyanosis. No masses, redness, swelling, asymmetry, or associated skin lesions. No contractures.  Skin & Extremity Inspection: Skin color, temperature, and hair growth are WNL. No peripheral edema or cyanosis. No masses, redness, swelling, asymmetry, or associated skin lesions. No contractures.  Functional ROM: Unrestricted ROM                  Functional ROM: Unrestricted ROM                  Muscle Tone/Strength: Functionally intact. No obvious neuro-muscular anomalies detected.  Muscle Tone/Strength: Functionally intact. No obvious neuro-muscular anomalies  detected.  Sensory (Neurological): Neuropathic pain pattern        Sensory (Neurological): Neuropathic pain pattern        DTR: Patellar: deferred today Achilles: deferred today Plantar: deferred today  DTR: Patellar: deferred today Achilles: deferred today Plantar: deferred today  Palpation: No palpable anomalies  Palpation: No palpable anomalies   Assessment & Plan  Primary Diagnosis & Pertinent Problem List: The primary encounter diagnosis was Chronic pain syndrome. Diagnoses of Chronic radicular lumbar pain, PTSD  (post-traumatic stress disorder), Lumbar radiculopathy, Failed back surgical syndrome, Chronic pain of both shoulders, Lumbar post-laminectomy syndrome, Lumbar degenerative disc disease, Encounter for long-term opiate analgesic use, Other intervertebral disc degeneration, lumbar region, and Pain management contract signed were also pertinent to this visit.  Visit Diagnosis: 1. Chronic pain syndrome   2. Chronic radicular lumbar pain   3. PTSD (post-traumatic stress disorder)   4. Lumbar radiculopathy   5. Failed back surgical syndrome   6. Chronic pain of both shoulders   7. Lumbar post-laminectomy syndrome   8. Lumbar degenerative disc disease   9. Encounter for long-term opiate analgesic use   10. Other intervertebral disc degeneration, lumbar region   11. Pain management contract signed    Problems updated and reviewed during this visit: Problem  Chronic Pain of Both Shoulders  Pain Management Contract Signed  Failed Back Surgical Syndrome  Chronic Radicular Lumbar Pain  Other Intervertebral Disc Degeneration, Lumbar Region    Plan of Care  Pharmacotherapy (Medications Ordered): Meds ordered this encounter  Medications  . morphine (MS CONTIN) 15 MG 12 hr tablet    Sig: Take 1 tablet (15 mg total) by mouth every 12 (twelve) hours. Must last 30 days.    Dispense:  60 tablet    Refill:  0    [place] STOP ACT - Not applicable. Fill one day early if pharmacy is closed on scheduled refill date.  . morphine (MS CONTIN) 15 MG 12 hr tablet    Sig: Take 1 tablet (15 mg total) by mouth every 12 (twelve) hours. Must last 30 days.    Dispense:  60 tablet    Refill:  0    [place] STOP ACT - Not applicable. Fill one day early if pharmacy is closed on scheduled refill date.  Marland Kitchen oxyCODONE-acetaminophen (PERCOCET) 10-325 MG tablet    Sig: Take 1 tablet by mouth every 12 (twelve) hours as needed for pain (breakthrough pain).    Dispense:  60 tablet    Refill:  0    For chronic pain syndrome  .  oxyCODONE-acetaminophen (PERCOCET) 10-325 MG tablet    Sig: Take 1 tablet by mouth every 12 (twelve) hours as needed for pain (breakthrough pain).    Dispense:  60 tablet    Refill:  0    For chronic pain syndrome  . gabapentin (NEURONTIN) 400 MG capsule    Sig: Take 1 capsule (400 mg total) by mouth every 8 (eight) hours.    Dispense:  90 capsule    Refill:  1    Fill one day early if pharmacy is closed on scheduled refill date. May substitute for generic if available.    Procedure Orders     EKG 12-Lead  Referral Orders     Ambulatory referral to Psychiatry  Interventional management options:  Considering:   Lidocaine infusion Lumbar epidural Spinal cord stimulator trial   PRN Procedures:   None at this time    Provider-requested follow-up: Return in about 7 weeks (around  05/08/2021) for Medication Management, in person. Recent Visits Date Type Provider Dept  01/23/21 Telemedicine Gillis Santa, MD [facility]  01/06/21 Procedure visit Gillis Santa, MD [facility]  Showing recent visits within past 90 days and meeting all other requirements Today's Visits Date Type Provider Dept  03/20/21 Office Visit Gillis Santa, MD [facility]  Showing today's visits and meeting all other requirements Future Appointments Date Type Provider Dept  04/29/21 Appointment Gillis Santa, MD [facility]  Showing future appointments within next 90 days and meeting all other requirements  Primary Care Physician: Lesleigh Noe, MD Note by: Gillis Santa, MD Date: 03/20/2021; Time: 2:52 PM

## 2021-04-07 DIAGNOSIS — M722 Plantar fascial fibromatosis: Secondary | ICD-10-CM | POA: Diagnosis not present

## 2021-04-21 ENCOUNTER — Telehealth: Payer: Self-pay

## 2021-04-21 MED ORDER — OMEPRAZOLE 40 MG PO CPDR: 40.0000 mg | DELAYED_RELEASE_CAPSULE | Freq: Every day | ORAL | 0 refills | Status: DC

## 2021-04-21 NOTE — Telephone Encounter (Signed)
[facility] pharmacy called in regards to the patient refill req.  On 04/14/21 Omeprazole prescription has ran out. Can't refill for patient until new prescription is sent [phone]

## 2021-04-21 NOTE — Telephone Encounter (Signed)
Sent medication to the pharmacy  

## 2021-04-22 ENCOUNTER — Ambulatory Visit: Payer: Medicare PPO | Admitting: Gastroenterology

## 2021-04-29 ENCOUNTER — Other Ambulatory Visit: Payer: Self-pay

## 2021-04-29 ENCOUNTER — Ambulatory Visit
Payer: Medicare PPO | Attending: Student in an Organized Health Care Education/Training Program | Admitting: Student in an Organized Health Care Education/Training Program

## 2021-04-29 ENCOUNTER — Encounter: Payer: Self-pay | Admitting: Student in an Organized Health Care Education/Training Program

## 2021-04-29 VITALS — BP 143/63 | Temp 97.3°F | Resp 18 | Ht 69.0 in | Wt 220.0 lb

## 2021-04-29 DIAGNOSIS — M25512 Pain in left shoulder: Secondary | ICD-10-CM

## 2021-04-29 DIAGNOSIS — M5416 Radiculopathy, lumbar region: Secondary | ICD-10-CM

## 2021-04-29 DIAGNOSIS — M961 Postlaminectomy syndrome, not elsewhere classified: Secondary | ICD-10-CM | POA: Diagnosis not present

## 2021-04-29 DIAGNOSIS — F4310 Post-traumatic stress disorder, unspecified: Secondary | ICD-10-CM | POA: Insufficient documentation

## 2021-04-29 DIAGNOSIS — M722 Plantar fascial fibromatosis: Secondary | ICD-10-CM | POA: Diagnosis not present

## 2021-04-29 DIAGNOSIS — M25511 Pain in right shoulder: Secondary | ICD-10-CM

## 2021-04-29 DIAGNOSIS — G894 Chronic pain syndrome: Secondary | ICD-10-CM

## 2021-04-29 DIAGNOSIS — G8929 Other chronic pain: Secondary | ICD-10-CM

## 2021-04-29 MED ORDER — OXYCODONE-ACETAMINOPHEN 10-325 MG PO TABS: 1.0000 | ORAL_TABLET | Freq: Two times a day (BID) | ORAL | 0 refills | Status: AC | PRN

## 2021-04-29 MED ORDER — MORPHINE SULFATE ER 15 MG PO TBCR: 15.0000 mg | EXTENDED_RELEASE_TABLET | Freq: Two times a day (BID) | ORAL | 0 refills | Status: AC

## 2021-04-29 MED ORDER — MORPHINE SULFATE ER 15 MG PO TBCR: 15.0000 mg | EXTENDED_RELEASE_TABLET | Freq: Two times a day (BID) | ORAL | 0 refills | Status: DC

## 2021-04-29 MED ORDER — OXYCODONE-ACETAMINOPHEN 10-325 MG PO TABS: 1.0000 | ORAL_TABLET | Freq: Two times a day (BID) | ORAL | 0 refills | Status: DC | PRN

## 2021-04-29 NOTE — Progress Notes (Signed)
PROVIDER NOTE: Information contained herein reflects review and annotations entered in association with encounter. Interpretation of such information and data should be left to medically-trained personnel. Information provided to patient can be located elsewhere in the medical record under "Patient Instructions". Document created using STT-dictation technology, any transcriptional errors that may result from process are unintentional.    Patient: Emily Landry  Service Category: E/M  Provider: Gillis Santa, MD  DOB: 04-16-1951  DOS: 04/29/2021  Specialty: Interventional Pain Management  MRN: [id number]  Setting: Ambulatory outpatient  PCP: Lesleigh Noe, MD  Type: Established Patient    Referring Provider: Lesleigh Noe, MD  Location: Office  Delivery: Face-to-face     HPI  [name], a 70 y.o. year old female, is here today because of her Plantar fasciitis of right foot [M72.2]. [name] primary complain today is Back Pain and Foot Pain (right) Last encounter: My last encounter with her was on 03/20/2021. Pertinent problems: [name] does not have any pertinent problems on file. Pain Assessment: Severity of Chronic pain is reported as a 6 /10. Location: Back Lower/denies. Onset: More than a month ago. Quality: Constant. Timing: Constant. Modifying factor(s):  Marland Kitchen Vitals:  height is 5' 9"  (1.753 m) and weight is 220 lb (99.8 kg). Her temperature is 97.3 F (36.3 C) (abnormal). Her blood pressure is 143/63 (abnormal). Her respiration is 18 and oxygen saturation is 97%.   Reason for encounter: medication management.    Patient follows up today for medication management.  No significant change in her medical history since her last clinic visit other than that she saw [facility] and received a boot for plantar fasciitis of her right foot. Is requesting injection in right foot for plantar fascitis.  Otherwise she comes in today for refill of her morphine and her Percocet.  No change in  dose.  Pharmacotherapy Assessment   Analgesic: Morphine 15 mg twice daily, MME equals 30; Percocet 10 mg twice daily as needed for breakthrough pain,; MME equals 30.  Total MME equals 60   Monitoring: Emily Landry PMP: PDMP reviewed during this encounter.       Pharmacotherapy: No side-effects or adverse reactions reported. Compliance: No problems identified. Effectiveness: Clinically acceptable.  Dewayne Shorter, RN  04/29/2021 11:23 AM  Sign when Signing Visit Nursing Pain Medication Assessment:  Safety precautions to be maintained throughout the outpatient stay will include: orient to surroundings, keep bed in low position, maintain call bell within reach at all times, provide assistance with transfer out of bed and ambulation.  Medication Inspection Compliance: Pill count conducted under aseptic conditions, in front of the patient. Neither the pills nor the bottle was removed from the patient's sight at any time. Once count was completed pills were immediately returned to the patient in their original bottle.  Medication: Morphine ER (MSContin) Pill/Patch Count: 38 of 60 pills remain Pill/Patch Appearance: Markings consistent with prescribed medication Bottle Appearance: Standard pharmacy container. Clearly labeled. Filled Date: 05 / 07 / 2022 Last Medication intake:  Today   Oxycodone/acetaminophen 39/60 Filled 04/19/2021 today    UDS:  Summary  Date Value Ref Range Status  03/06/2021 Note  Final    Comment:    ==================================================================== [employer] Select 13 (MW) ==================================================================== Test                             Result       Flag       Units  Drug Present and Declared for Prescription Verification   Oxycodone                      1031         EXPECTED   ng/mg creat   Oxymorphone                    760          EXPECTED   ng/mg creat   Noroxycodone                   4697         EXPECTED    ng/mg creat   Noroxymorphone                 277          EXPECTED   ng/mg creat    Sources of oxycodone are scheduled prescription medications.    Oxymorphone, noroxycodone, and noroxymorphone are expected    metabolites of oxycodone. Oxymorphone is also available as a    scheduled prescription medication.  Drug Present not Declared for Prescription Verification   Morphine                       10231        UNEXPECTED ng/mg creat   Normorphine                    549          UNEXPECTED ng/mg creat    Potential sources of large amounts of morphine in the absence of    codeine include administration of morphine or use of heroin.     Normorphine is an expected metabolite of morphine.  ==================================================================== Test                      Result    Flag   Units      Ref Range   Creatinine              35               mg/dL      >=20 ==================================================================== Declared Medications:  The flagging and interpretation on this report are based on the  following declared medications.  Unexpected results may arise from  inaccuracies in the declared medications.   **Note: The testing scope of this panel includes these medications:   Oxycodone (Percocet)   **Note: The testing scope of this panel does not include the  following reported medications:   Acetaminophen (Percocet)  Buspirone (Buspar)  Duloxetine (Cymbalta)  Folic Acid  Gabapentin (Neurontin)  Hydroxychloroquine  Hydroxyzine (Atarax)  Methotrexate  Omeprazole (Prilosec)  Prednisone (Deltasone)  Triamcinolone (Kenalog)  Zolpidem (Ambien) ==================================================================== For clinical consultation, please call [phone]. ====================================================================      ROS  Constitutional: Denies any fever or chills Gastrointestinal: No reported hemesis, hematochezia,  vomiting, or acute GI distress Musculoskeletal: right plantar fascitis foot pain Neurological: No reported episodes of acute onset apraxia, aphasia, dysarthria, agnosia, amnesia, paralysis, loss of coordination, or loss of consciousness  Medication Review  DULoxetine, Hydroxychloroquine Sulfate, busPIRone, folic acid, gabapentin, methotrexate, morphine, omeprazole, oxyCODONE-acetaminophen, predniSONE, triamcinolone cream, and zolpidem  History Review  Allergy: [name] has No Known Allergies. Drug: [name]  reports no history of drug use. Alcohol:  reports current alcohol use. Tobacco:  reports that she has never smoked. She has never used smokeless  tobacco. Social: [name]  reports that she has never smoked. She has never used smokeless tobacco. She reports current alcohol use. She reports that she does not use drugs. Medical:  has a past medical history of Anxiety, Arthritis, Depression, and GERD (gastroesophageal reflux disease). Surgical: [name]  has a past surgical history that includes Laminectomy (1971); Lumbar disc surgery (1991); Tonsillectomy; Cholecystectomy; and Abdominal hysterectomy. Family: family history includes Alcohol abuse in her father; Arthritis in her father; Breast cancer (age of onset: 58) in her mother; Diabetes in her mother; Gout in her father; Rheum arthritis in her mother.  Laboratory Chemistry Profile   Renal Lab Results  Component Value Date   BUN 11 05/21/2020   CREATININE 0.6 05/21/2020     Hepatic Lab Results  Component Value Date   AST 31 05/21/2020   ALT 22 05/21/2020   ALBUMIN 4.2 05/21/2020   ALKPHOS 71 05/21/2020     Electrolytes Lab Results  Component Value Date   NA 142 05/21/2020   K 4.9 05/21/2020   CL 104 05/21/2020   CALCIUM 9.1 05/21/2020     Bone No results found for: VD25OH, VD125OH2TOT, OE4235TI1, WE3154MG8, 25OHVITD1, 25OHVITD2, 25OHVITD3, TESTOFREE, TESTOSTERONE   Inflammation (CRP: Acute Phase) (ESR:  Chronic Phase) Lab Results  Component Value Date   ESRSEDRATE 6 12/06/2019       Note: Above Lab results reviewed.  Recent Imaging Review  DG PAIN CLINIC C-ARM 1-60 MIN NO REPORT Fluoro was used, but no Radiologist interpretation will be provided.  Please refer to "NOTES" tab for provider progress note. Note: Reviewed        Physical Exam  General appearance: Well nourished, well developed, and well hydrated. In no apparent acute distress Mental status: Alert, oriented x 3 (person, place, & time)       Respiratory: No evidence of acute respiratory distress Eyes: PERLA Vitals: BP (!) 143/63   Temp (!) 97.3 F (36.3 C)   Resp 18   Ht 5' 9"  (1.753 m)   Wt 220 lb (99.8 kg)   SpO2 97%   BMI 32.49 kg/m  BMI: Estimated body mass index is 32.49 kg/m as calculated from the following:   Height as of this encounter: 5' 9"  (1.753 m).   Weight as of this encounter: 220 lb (99.8 kg). Ideal: Ideal body weight: 66.2 kg (145 lb 15.1 oz) Adjusted ideal body weight: 79.6 kg (175 lb 9.1 oz)  +low back pain LE neuropathic pain Right plantar fascitis pain 5 out of 5 strength bilateral lower extremity: Plantar flexion, dorsiflexion, knee flexion, knee extension.   Assessment   Status Diagnosis  Having a Flare-up Controlled Controlled 1. Plantar fasciitis of right foot   2. Chronic radicular lumbar pain   3. PTSD (post-traumatic stress disorder)   4. Failed back surgical syndrome   5. Chronic pain of both shoulders   6. Lumbar post-laminectomy syndrome   7. Lumbar radiculopathy   8. Chronic pain syndrome      Updated Problems: Problem  Ptsd (Post-Traumatic Stress Disorder)  Plantar Fasciitis of Right Foot    Plan of Care   [name] has a current medication list which includes the following long-term medication(s): duloxetine, gabapentin, omeprazole, and zolpidem.  Pharmacotherapy (Medications Ordered): Meds ordered this encounter  Medications  . morphine (MS  CONTIN) 15 MG 12 hr tablet    Sig: Take 1 tablet (15 mg total) by mouth every 12 (twelve) hours. Must last 30 days.    Dispense:  60 tablet    Refill:  0    [place] STOP ACT - Not applicable. Fill one day early if pharmacy is closed on scheduled refill date.  Marland Kitchen oxyCODONE-acetaminophen (PERCOCET) 10-325 MG tablet    Sig: Take 1 tablet by mouth every 12 (twelve) hours as needed for pain (breakthrough pain).    Dispense:  60 tablet    Refill:  0    For chronic pain syndrome  . oxyCODONE-acetaminophen (PERCOCET) 10-325 MG tablet    Sig: Take 1 tablet by mouth every 12 (twelve) hours as needed for pain (breakthrough pain).    Dispense:  60 tablet    Refill:  0    For chronic pain syndrome  . morphine (MS CONTIN) 15 MG 12 hr tablet    Sig: Take 1 tablet (15 mg total) by mouth every 12 (twelve) hours. Must last 30 days.    Dispense:  60 tablet    Refill:  0    [place] STOP ACT - Not applicable. Fill one day early if pharmacy is closed on scheduled refill date.   Orders:  Orders Placed This Encounter  Procedures  . TRIGGER POINT INJECTION    Standing Status:   Future    Standing Expiration Date:   07/30/2021    Scheduling Instructions:     Right foot    Order Specific Question:   Where will this procedure be performed?    Answer:   [facility]   Discussed potential SCS trial for failed back surgical syndrome  Follow-up plan:   Return in about 11 weeks (around 07/15/2021) for Medication Management, in person.     Left L5-S1 ESI #1,  01/06/2021, 6 cc injected     Recent Visits Date Type Provider Dept  03/20/21 Office Visit Gillis Santa, MD [facility]  Showing recent visits within past 90 days and meeting all other requirements Today's Visits Date Type Provider Dept  04/29/21 Office Visit Gillis Santa, MD [facility]  Showing today's visits and meeting all other requirements Future Appointments Date Type Provider Dept  07/15/21 Appointment Gillis Santa, MD  [facility]  Showing future appointments within next 90 days and meeting all other requirements  I discussed the assessment and treatment plan with the patient. The patient was provided an opportunity to ask questions and all were answered. The patient agreed with the plan and demonstrated an understanding of the instructions.  Patient advised to call back or seek an in-person evaluation if the symptoms or condition worsens.  Duration of encounter: 30 minutes.  Note by: Gillis Santa, MD Date: 04/29/2021; Time: 11:57 AM

## 2021-04-29 NOTE — Patient Instructions (Signed)
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
## 2021-04-29 NOTE — Progress Notes (Signed)
Nursing Pain Medication Assessment:  Safety precautions to be maintained throughout the outpatient stay will include: orient to surroundings, keep bed in low position, maintain call bell within reach at all times, provide assistance with transfer out of bed and ambulation.  Medication Inspection Compliance: Pill count conducted under aseptic conditions, in front of the patient. Neither the pills nor the bottle was removed from the patient's sight at any time. Once count was completed pills were immediately returned to the patient in their original bottle.  Medication: Morphine ER (MSContin) Pill/Patch Count: 38 of 60 pills remain Pill/Patch Appearance: Markings consistent with prescribed medication Bottle Appearance: Standard pharmacy container. Clearly labeled. Filled Date: 05 / 07 / 2022 Last Medication intake:  Today   Oxycodone/acetaminophen 39/60 Filled 04/19/2021 today

## 2021-04-30 ENCOUNTER — Encounter: Payer: Self-pay | Admitting: Psychiatry

## 2021-04-30 ENCOUNTER — Telehealth (INDEPENDENT_AMBULATORY_CARE_PROVIDER_SITE_OTHER): Payer: Medicare PPO | Admitting: Psychiatry

## 2021-04-30 DIAGNOSIS — F331 Major depressive disorder, recurrent, moderate: Secondary | ICD-10-CM | POA: Diagnosis not present

## 2021-04-30 DIAGNOSIS — F4310 Post-traumatic stress disorder, unspecified: Secondary | ICD-10-CM | POA: Diagnosis not present

## 2021-04-30 NOTE — Patient Instructions (Signed)
1. Start bupropion 150 mg daily  2. Continue duloxetine 60 mg daily  3. Next appointment: 6/28 at 2:30  4. Referral to therapy

## 2021-05-02 ENCOUNTER — Telehealth: Payer: Self-pay

## 2021-05-02 ENCOUNTER — Other Ambulatory Visit: Payer: Self-pay | Admitting: Psychiatry

## 2021-05-02 ENCOUNTER — Telehealth (HOSPITAL_COMMUNITY): Payer: Self-pay | Admitting: Psychiatry

## 2021-05-02 MED ORDER — BUPROPION HCL ER (XL) 150 MG PO TB24: 150.0000 mg | ORAL_TABLET | Freq: Every day | ORAL | 1 refills | Status: DC

## 2021-05-02 NOTE — Telephone Encounter (Signed)
Patient called inquiring about RX for Wellbutrin, wanted to know which pharmacy has RX because the [facility] near her does not have RX.

## 2021-05-02 NOTE — Telephone Encounter (Signed)
pt called states she needs her rx for the [facility]butrin to [facility]

## 2021-05-02 NOTE — Telephone Encounter (Signed)
Sorry it seems like I missed to order it. Ordered it to the pharmacy.

## 2021-05-16 ENCOUNTER — Encounter: Payer: Self-pay | Admitting: Student in an Organized Health Care Education/Training Program

## 2021-05-19 ENCOUNTER — Ambulatory Visit
Payer: Medicare PPO | Attending: Student in an Organized Health Care Education/Training Program | Admitting: Student in an Organized Health Care Education/Training Program

## 2021-05-19 ENCOUNTER — Other Ambulatory Visit: Payer: Self-pay

## 2021-05-19 ENCOUNTER — Encounter: Payer: Self-pay | Admitting: Student in an Organized Health Care Education/Training Program

## 2021-05-19 VITALS — BP 142/74 | HR 80 | Temp 97.0°F | Resp 16 | Ht 69.0 in | Wt 220.0 lb

## 2021-05-19 DIAGNOSIS — M722 Plantar fascial fibromatosis: Secondary | ICD-10-CM

## 2021-05-19 MED ORDER — ROPIVACAINE HCL 2 MG/ML IJ SOLN
4.0000 mL | Freq: Once | INTRAMUSCULAR | Status: AC
  Administered 2021-05-19: 4 mL via PERINEURAL

## 2021-05-19 MED ORDER — ROPIVACAINE HCL 2 MG/ML IJ SOLN
INTRAMUSCULAR | Status: AC
  Filled 2021-05-19: qty 20

## 2021-05-19 MED ORDER — METHYLPREDNISOLONE ACETATE 40 MG/ML IJ SUSP
40.0000 mg | Freq: Once | INTRAMUSCULAR | Status: AC
  Administered 2021-05-19: 40 mg
  Filled 2021-05-19: qty 1

## 2021-05-19 NOTE — Progress Notes (Signed)
PROVIDER NOTE: Information contained herein reflects review and annotations entered in association with encounter. Interpretation of such information and data should be left to medically-trained personnel. Information provided to patient can be located elsewhere in the medical record under "Patient Instructions". Document created using STT-dictation technology, any transcriptional errors that may result from process are unintentional.    Patient: Emily Landry  Service Category: Procedure  Provider: Edward Jolly, MD  DOB: [birth year]  DOS: 05/19/2021  Location: [facility]  MRN: [id number]  Setting: Ambulatory - outpatient  Referring Provider: Lynnda Child, MD  Type: Established Patient  Specialty: Interventional Pain Management  PCP: Lynnda Child, MD   Primary Reason for Visit: Interventional Pain Management Treatment. CC: Foot Pain (Right plantar)  Procedure:          Anesthesia, Analgesia, Anxiolysis:  Right plantar fasciitis steroid injection for right plantar fasciitis  Type: Local Anesthesia Indication(s): Analgesia         Local Anesthetic: Lidocaine 1-2% Route: Infiltration ([place]/IM) IV Access: Declined Sedation: Declined   Position: Supine   Indications: 1. Plantar fasciitis of right foot    Pain Score: Pre-procedure: 6 /10 Post-procedure: 4 /10   Pre-op H&P Assessment:  Emily Landry is a 70 y.o. (year old), female patient, seen today for interventional treatment. She  has a past surgical history that includes Laminectomy (1971); Lumbar disc surgery (1991); Tonsillectomy; Cholecystectomy; and Abdominal hysterectomy. Emily Landry has a current medication list which includes the following prescription(s): bupropion, duloxetine, enbrel, folic acid, gabapentin, hydroxychloroquine sulfate, methotrexate, morphine, [START ON 06/18/2021] morphine, omeprazole, oxycodone-acetaminophen, [START ON 06/18/2021] oxycodone-acetaminophen, prednisone, triamcinolone cream, and zolpidem.  Her primarily concern today is the Foot Pain (Right plantar)  Initial Vital Signs:  Pulse/HCG Rate: 80  Temp: (!) 97 F (36.1 C) Resp: 16 BP: (!) 142/74 SpO2: (!) 80 %  BMI: Estimated body mass index is 32.49 kg/m as calculated from the following:   Height as of this encounter:  (1.753 m).   Weight as of this encounter: 220 lb (99.8 kg).  Risk Assessment: Allergies: Reviewed. She has No Known Allergies.  Allergy Precautions: None required Coagulopathies: Reviewed. None identified.  Blood-thinner therapy: None at this time Active Infection(s): Reviewed. None identified. Emily Landry is afebrile  Site Confirmation: Emily Landry was asked to confirm the procedure and laterality before marking the site Procedure checklist: Completed Consent: Before the procedure and under the influence of no sedative(s), amnesic(s), or anxiolytics, the patient was informed of the treatment options, risks and possible complications. To fulfill our ethical and legal obligations, as recommended by the American Medical Association's Code of Ethics, I have informed the patient of my clinical impression; the nature and purpose of the treatment or procedure; the risks, benefits, and possible complications of the intervention; the alternatives, including doing nothing; the risk(s) and benefit(s) of the alternative treatment(s) or procedure(s); and the risk(s) and benefit(s) of doing nothing. The patient was provided information about the general risks and possible complications associated with the procedure. These may include, but are not limited to: failure to achieve desired goals, infection, bleeding, organ or nerve damage, allergic reactions, paralysis, and death. In addition, the patient was informed of those risks and complications associated to the procedure, such as failure to decrease pain; infection; bleeding; organ or nerve damage with subsequent damage to sensory, motor, and/or autonomic systems,  resulting in permanent pain, numbness, and/or weakness of one or several areas of the body; allergic reactions; (i.e.: anaphylactic reaction); and/or death. Furthermore, the  patient was informed of those risks and complications associated with the medications. These include, but are not limited to: allergic reactions (i.e.: anaphylactic or anaphylactoid reaction(s)); adrenal axis suppression; blood sugar elevation that in diabetics may result in ketoacidosis or comma; water retention that in patients with history of congestive heart failure may result in shortness of breath, pulmonary edema, and decompensation with resultant heart failure; weight gain; swelling or edema; medication-induced neural toxicity; particulate matter embolism and blood vessel occlusion with resultant organ, and/or nervous system infarction; and/or aseptic necrosis of one or more joints. Finally, the patient was informed that Medicine is not an exact science; therefore, there is also the possibility of unforeseen or unpredictable risks and/or possible complications that may result in a catastrophic outcome. The patient indicated having understood very clearly. We have given the patient no guarantees and we have made no promises. Enough time was given to the patient to ask questions, all of which were answered to the patient's satisfaction. Emily Landry has indicated that she wanted to continue with the procedure. Attestation: I, the ordering provider, attest that I have discussed with the patient the benefits, risks, side-effects, alternatives, likelihood of achieving goals, and potential problems during recovery for the procedure that I have provided informed consent. Date  Time: 05/19/2021  8:41 AM  Pre-Procedure Preparation:  Monitoring: As per clinic protocol. Respiration, ETCO2, SpO2, BP, heart rate and rhythm monitor placed and checked for adequate function Safety Precautions: Patient was assessed for positional comfort and  pressure points before starting the procedure. Time-out: I initiated and conducted the "Time-out" before starting the procedure, as per protocol. The patient was asked to participate by confirming the accuracy of the "Time Out" information. Verification of the correct person, site, and procedure were performed and confirmed by me, the nursing staff, and the patient. "Time-out" conducted as per Joint Commission's Universal Protocol (UP.01.01.01). Time: 0922  Description of Procedure:          Approach: Plantar approach. Area Prepped: Entire foot Region DuraPrep (Iodine Povacrylex [0.7% available iodine] and Isopropyl Alcohol, 74% w/w) Safety Precautions: Aspiration looking for blood return was conducted prior to all injections. At no point did we inject any substances, as a needle was being advanced. No attempts were made at seeking any paresthesias. Safe injection practices and needle disposal techniques used. Medications properly checked for expiration dates. SDV (single dose vial) medications used. Description of the Procedure: Protocol guidelines were followed. The patient was placed in position. The target area was identified and the area prepped in the usual manner. Skin & deeper tissues infiltrated with local anesthetic. Appropriate amount of time allowed to pass for local anesthetics to take effect. The procedure needles were then advanced to the target area. Proper needle placement secured. Negative aspiration confirmed. Solution injected in intermittent fashion, asking for systemic symptoms every 0.5cc of injectate. The needles were then removed and the area cleansed, making sure to leave some of the prepping solution back to take advantage of its long term bactericidal properties.  Right plantar fasciitis injection: A line drawn posterior to the medial malleolus to the point of maximal tenderness along the plantar aspect of the foot.  4 cc solution made of 3 cc of 0.2% ropivacaine, 1 cc of  methylprednisolone, 40 mg/cc was injected after negative aspiration.                      Vitals:   05/19/21 0846  BP: (!) 142/74  Pulse: 80  Resp:  16  Temp: (!) 97 F (36.1 C)  TempSrc: Temporal  SpO2: (!) 80%  Weight: 220 lb (99.8 kg)  Height: 5\' 9"  (1.753 m)    Start Time: 0922 hrs. End Time: 0927 hrs.  Materials:  Needle(s) Type: Spinal Needle Gauge: 22G Length: 3.5-in Medication(s): Please see above.   Post-operative Assessment:  Post-procedure Vital Signs:  Pulse/HCG Rate: 80  Temp: (!) 97 F (36.1 C) Resp: 16 BP: (!) 142/74 SpO2: (!) 80 %  EBL: None  Complications: No immediate post-treatment complications observed by team, or reported by patient.  Note: The patient tolerated the entire procedure well. A repeat set of vitals were taken after the procedure and the patient was kept under observation following institutional policy, for this type of procedure. Post-procedural neurological assessment was performed, showing return to baseline, prior to discharge. The patient was provided with post-procedure discharge instructions, including a section on how to identify potential problems. Should any problems arise concerning this procedure, the patient was given instructions to immediately contact , at any time, without hesitation. In any case, we plan to contact the patient by telephone for a follow-up status report regarding this interventional procedure.  Comments:  No additional relevant information.  Plan of Care   Chronic Opioid Analgesic:  Morphine 15 mg twice daily, MME equals 30; Percocet 10 mg twice daily as needed for breakthrough pain,; MME equals 30.  Total MME equals 60   Medications ordered for procedure: Meds ordered this encounter  Medications  . methylPREDNISolone acetate (DEPO-MEDROL) injection 40 mg  . ropivacaine (PF) 2 mg/mL (0.2%) (NAROPIN) injection 4 mL   Medications administered: We administered methylPREDNISolone acetate and  ropivacaine (PF) 2 mg/mL (0.2%).  See the medical record for exact dosing, route, and time of administration.  Follow-up plan:   Return for Keep sch. appt.      Left L5-S1 ESI #1,  01/06/2021, 6 cc injected      Recent Visits Date Type Provider Dept  04/29/21 Office Visit 05/01/21, MD [facility]  03/20/21 Office Visit 05/20/21, MD [facility]  Showing recent visits within past 90 days and meeting all other requirements Today's Visits Date Type Provider Dept  05/19/21 Procedure visit 07/19/21, MD [facility]  Showing today's visits and meeting all other requirements Future Appointments Date Type Provider Dept  07/15/21 Appointment 09/14/21, MD [facility]  Showing future appointments within next 90 days and meeting all other requirements  Disposition: Discharge home  Discharge (Date  Time): 05/19/2021; 0927 hrs.   Primary Care Physician: 07/19/2021, MD Location: [facility] Note by: [facility], MD Date: 05/19/2021; Time: 9:45 AM  Disclaimer:  Medicine is not an exact science. The only guarantee in medicine is that nothing is guaranteed. It is important to note that the decision to proceed with this intervention was based on the information collected from the patient. The Data and conclusions were drawn from the patient's questionnaire, the interview, and the physical examination. Because the information was provided in large part by the patient, it cannot be guaranteed that it has not been purposely or unconsciously manipulated. Every effort has been made to obtain as much relevant data as possible for this evaluation. It is important to note that the conclusions that lead to this procedure are derived in large part from the available data. Always take into account that the treatment will also be dependent on availability of resources and existing treatment guidelines, considered  by other Pain  Management Practitioners as being common knowledge and practice, at the time of the intervention. For Medico-Legal purposes, it is also important to point out that variation in procedural techniques and pharmacological choices are the acceptable norm. The indications, contraindications, technique, and results of the above procedure should only be interpreted and judged by a Board-Certified Interventional Pain Specialist with extensive familiarity and expertise in the same exact procedure and technique.

## 2021-05-19 NOTE — Progress Notes (Signed)
Safety precautions to be maintained throughout the outpatient stay will include: orient to surroundings, keep bed in low position, maintain call bell within reach at all times, provide assistance with transfer out of bed and ambulation.  

## 2021-05-19 NOTE — Patient Instructions (Signed)

## 2021-05-20 ENCOUNTER — Telehealth: Payer: Self-pay | Admitting: *Deleted

## 2021-05-20 NOTE — Telephone Encounter (Signed)
No problems post procedure. 

## 2021-05-22 ENCOUNTER — Other Ambulatory Visit: Payer: Self-pay | Admitting: Family Medicine

## 2021-05-22 DIAGNOSIS — G4700 Insomnia, unspecified: Secondary | ICD-10-CM

## 2021-05-23 MED ORDER — ZOLPIDEM TARTRATE 5 MG PO TABS: 5.0000 mg | ORAL_TABLET | Freq: Every evening | ORAL | 0 refills | Status: DC | PRN

## 2021-05-23 NOTE — Telephone Encounter (Signed)
Last refill: 03/03/21 #30 with 1 Last OV: 03/03/21 No appts scheduled

## 2021-06-03 ENCOUNTER — Ambulatory Visit (INDEPENDENT_AMBULATORY_CARE_PROVIDER_SITE_OTHER): Payer: Medicare PPO | Admitting: Licensed Clinical Social Worker

## 2021-06-03 ENCOUNTER — Other Ambulatory Visit: Payer: Self-pay

## 2021-06-03 DIAGNOSIS — F331 Major depressive disorder, recurrent, moderate: Secondary | ICD-10-CM | POA: Diagnosis not present

## 2021-06-03 NOTE — Progress Notes (Signed)
Virtual Visit via Video Note  I connected with [name] on 06/03/21 at  2:00 PM EDT by a video enabled telemedicine application and verified that I am speaking with the correct person using two identifiers.  Video connection was lost when less than 50% of the duration of the visit was complete, at which time the remainder of the visit was completed via audio only.  Location: Patient: home Provider: remote office [place], [place])   I discussed the limitations of evaluation and management by telemedicine and the availability of in person appointments. The patient expressed understanding and agreed to proceed.  I discussed the assessment and treatment plan with the patient. The patient was provided an opportunity to ask questions and all were answered. The patient agreed with the plan and demonstrated an understanding of the instructions.   The patient was advised to call back or seek an in-person evaluation if the symptoms worsen or if the condition fails to improve as anticipated.  I provided 55 minutes of non-face-to-face time during this encounter.   Fenton Candee R Artesha Wemhoff, LCSW   THERAPIST PROGRESS NOTE  Session Time: 2:10-3:05  Participation Level: Active  Behavioral Response: NeatAlertAnxious and Depressed  Type of Therapy: Individual Therapy  Treatment Goals addressed: Anxiety and Diagnosis: MDD-history of PTSD  Interventions: Supportive and Other: trauma focused  Summary: Analeise Mccleery is a 70 y.o. female who presents with symptoms consistent with depression, anxiety, and trauma. Pt reports that overall mood fluctuates depending on situation at hand. Pt reports that she is compliant with medication.  Pt reports that she takes medication to assist with good quality sleep at night.  Allowed pt to explore and express thoughts and feelings associated with recent life situations and external stressors.Allowed pt to explore family relationships, and primary marriage  relationship. Pt feels as if she is a people-pleaser and has never really been assertive about her own wants and needs. Pt feels her children are not respectful and don't treat her the way that she deserves to be treated. Pt feels husband is often "very loud and has to be the center of attention".  Pt admits that she often withdraws from situations versus healthy conflict.   Pt feels that her chronic pain keeps her from exercising--and exercising makes her feel better.  Continued recommendations are as follows: self care behaviors, positive social engagements, focusing on overall work/home/life balance, and focusing on positive physical and emotional wellness.   Suicidal/Homicidal: No  Therapist Response: Initial session with Ellissa--assessment and development of treatment plan.   Plan: Return again in 4 weeks.  Diagnosis: Axis I: MDD, history of PTSD    Axis II: No diagnosis    [name] Yitzchak Kothari, LCSW 06/03/2021
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
## 2021-06-09 ENCOUNTER — Telehealth: Payer: Self-pay | Admitting: Student in an Organized Health Care Education/Training Program

## 2021-06-09 MED ORDER — GABAPENTIN 400 MG PO CAPS: 400.0000 mg | ORAL_CAPSULE | Freq: Three times a day (TID) | ORAL | 5 refills | Status: DC

## 2021-06-09 NOTE — Telephone Encounter (Signed)
Patient called asking to get refill on gabapentin. She just had appt 05-19-21. Got other meds but not this one.  Also please send this to new pharmacy [facility] [phone] [address] [place], [place] [address]  Please let patient know if this can be done. She has MM appt 07-15-21  Thank you

## 2021-06-10 ENCOUNTER — Encounter: Payer: Self-pay | Admitting: Psychiatry

## 2021-06-10 ENCOUNTER — Other Ambulatory Visit: Payer: Self-pay

## 2021-06-10 ENCOUNTER — Telehealth (INDEPENDENT_AMBULATORY_CARE_PROVIDER_SITE_OTHER): Payer: Medicare PPO | Admitting: Psychiatry

## 2021-06-10 DIAGNOSIS — F331 Major depressive disorder, recurrent, moderate: Secondary | ICD-10-CM | POA: Diagnosis not present

## 2021-06-10 DIAGNOSIS — F4310 Post-traumatic stress disorder, unspecified: Secondary | ICD-10-CM | POA: Diagnosis not present

## 2021-06-10 MED ORDER — BUPROPION HCL ER (XL) 300 MG PO TB24: 300.0000 mg | ORAL_TABLET | Freq: Every day | ORAL | 1 refills | Status: DC

## 2021-06-10 NOTE — Patient Instructions (Signed)
1. Increase bupropion 300 mg daily 2. Continue duloxetine 60 mg daily 3. Next appointment: 8/16 at 3 PM

## 2021-06-17 DIAGNOSIS — M19042 Primary osteoarthritis, left hand: Secondary | ICD-10-CM | POA: Diagnosis not present

## 2021-06-17 DIAGNOSIS — M19041 Primary osteoarthritis, right hand: Secondary | ICD-10-CM | POA: Diagnosis not present

## 2021-06-17 DIAGNOSIS — M0609 Rheumatoid arthritis without rheumatoid factor, multiple sites: Secondary | ICD-10-CM | POA: Diagnosis not present

## 2021-06-17 DIAGNOSIS — Z79899 Other long term (current) drug therapy: Secondary | ICD-10-CM | POA: Diagnosis not present

## 2021-07-12 ENCOUNTER — Other Ambulatory Visit: Payer: Self-pay | Admitting: Family Medicine

## 2021-07-12 DIAGNOSIS — G4700 Insomnia, unspecified: Secondary | ICD-10-CM

## 2021-07-13 ENCOUNTER — Other Ambulatory Visit: Payer: Self-pay | Admitting: Family Medicine

## 2021-07-13 DIAGNOSIS — G4700 Insomnia, unspecified: Secondary | ICD-10-CM

## 2021-07-14 MED ORDER — ZOLPIDEM TARTRATE 5 MG PO TABS: 5.0000 mg | ORAL_TABLET | Freq: Every evening | ORAL | 0 refills | Status: DC | PRN

## 2021-07-14 NOTE — Telephone Encounter (Signed)
LAST OV - 03/03/2021 NEXT OV - N?A LAST FILLED - 05/23/2021

## 2021-07-14 NOTE — Telephone Encounter (Signed)
Refill provided.   She will need an appointment within the next 2 months before additional refills.

## 2021-07-15 ENCOUNTER — Ambulatory Visit
Payer: Medicare PPO | Attending: Student in an Organized Health Care Education/Training Program | Admitting: Student in an Organized Health Care Education/Training Program

## 2021-07-15 ENCOUNTER — Ambulatory Visit (INDEPENDENT_AMBULATORY_CARE_PROVIDER_SITE_OTHER): Payer: Medicare PPO | Admitting: Licensed Clinical Social Worker

## 2021-07-15 ENCOUNTER — Encounter: Payer: Self-pay | Admitting: Student in an Organized Health Care Education/Training Program

## 2021-07-15 ENCOUNTER — Other Ambulatory Visit: Payer: Self-pay

## 2021-07-15 VITALS — BP 148/85 | HR 73 | Temp 97.0°F | Resp 18 | Ht 69.0 in | Wt 218.0 lb

## 2021-07-15 DIAGNOSIS — M961 Postlaminectomy syndrome, not elsewhere classified: Secondary | ICD-10-CM | POA: Diagnosis not present

## 2021-07-15 DIAGNOSIS — M25512 Pain in left shoulder: Secondary | ICD-10-CM | POA: Diagnosis not present

## 2021-07-15 DIAGNOSIS — M722 Plantar fascial fibromatosis: Secondary | ICD-10-CM | POA: Insufficient documentation

## 2021-07-15 DIAGNOSIS — F4310 Post-traumatic stress disorder, unspecified: Secondary | ICD-10-CM | POA: Diagnosis not present

## 2021-07-15 DIAGNOSIS — G8929 Other chronic pain: Secondary | ICD-10-CM | POA: Insufficient documentation

## 2021-07-15 DIAGNOSIS — M25511 Pain in right shoulder: Secondary | ICD-10-CM | POA: Insufficient documentation

## 2021-07-15 DIAGNOSIS — M5416 Radiculopathy, lumbar region: Secondary | ICD-10-CM | POA: Insufficient documentation

## 2021-07-15 DIAGNOSIS — G894 Chronic pain syndrome: Secondary | ICD-10-CM | POA: Diagnosis not present

## 2021-07-15 MED ORDER — MORPHINE SULFATE ER 15 MG PO TBCR: 15.0000 mg | EXTENDED_RELEASE_TABLET | Freq: Two times a day (BID) | ORAL | 0 refills | Status: AC

## 2021-07-15 MED ORDER — OXYCODONE-ACETAMINOPHEN 10-325 MG PO TABS: 1.0000 | ORAL_TABLET | Freq: Two times a day (BID) | ORAL | 0 refills | Status: DC | PRN

## 2021-07-15 MED ORDER — OXYCODONE-ACETAMINOPHEN 10-325 MG PO TABS: 1.0000 | ORAL_TABLET | Freq: Two times a day (BID) | ORAL | 0 refills | Status: AC | PRN

## 2021-07-15 MED ORDER — MORPHINE SULFATE ER 15 MG PO TBCR: 15.0000 mg | EXTENDED_RELEASE_TABLET | Freq: Two times a day (BID) | ORAL | 0 refills | Status: DC

## 2021-07-15 NOTE — Progress Notes (Signed)
PROVIDER NOTE: Information contained herein reflects review and annotations entered in association with encounter. Interpretation of such information and data should be left to medically-trained personnel. Information provided to patient can be located elsewhere in the medical record under "Patient Instructions". Document created using STT-dictation technology, any transcriptional errors that may result from process are unintentional.    Patient: Emily Landry  Service Category: E/M  Provider: Gillis Santa, MD  DOB: 1951-01-11  DOS: 07/15/2021  Specialty: Interventional Pain Management  MRN: [id number]  Setting: Ambulatory outpatient  PCP: Lesleigh Noe, MD  Type: Established Patient    Referring Provider: Lesleigh Noe, MD  Location: Office  Delivery: Face-to-face     HPI  [name], a 70 y.o. year old female, is here today because of her Plantar fasciitis of right foot [M72.2]. [name] primary complain today is Back Pain (lower) and Neck Pain Last encounter: My last encounter with her was on 06/09/2021. Pertinent problems: [name] does not have any pertinent problems on file. Pain Assessment: Severity of Chronic pain is reported as a 6 /10. Location: Back Lower/ . Onset: More than a month ago. Quality: Dull, Constant. Timing: Constant. Modifying factor(s): lying down. Vitals:  height is _0  (1.753 m) and weight is 218 lb (98.9 kg). Her temporal temperature is 97 F (36.1 C) (abnormal). Her blood pressure is 148/85 (abnormal) and her pulse is 73. Her respiration is 18 and oxygen saturation is 98%.   Reason for encounter: both, medication management and post-procedure assessment.   Good relief from plantar fascitis block  Now having low back pain and leg pain, discussed repeating L-ESI, previously done in January   Post-Procedure Evaluation  Procedure (05/19/2021):  Right plantar fasciitis steroid injection for right plantar fasciitis  Anxiolysis: Please see nurses note.   Effectiveness during initial hour after procedure (Ultra-Short Term Relief): 0 %   Local anesthetic used: Long-acting (4-6 hours) Effectiveness: Defined as any analgesic benefit obtained secondary to the administration of local anesthetics. This carries significant diagnostic value as to the etiological location, or anatomical origin, of the pain. Duration of benefit is expected to coincide with the duration of the local anesthetic used.  Effectiveness during initial 4-6 hours after procedure (Short-Term Relief): 50 %   Long-term benefit: Defined as any relief past the pharmacologic duration of the local anesthetics.  Effectiveness past the initial 6 hours after procedure (Long-Term Relief): 100 %  Benefits, current: Defined as benefit present at the time of this evaluation.   Analgesia:  90-100% better    Pharmacotherapy Assessment  Analgesic: Morphine 15 mg twice daily, MME equals 30; Percocet 10 mg twice daily as needed for breakthrough pain,; MME equals 30.  Total MME equals 60    Monitoring: [place] PMP: PDMP reviewed during this encounter.       Pharmacotherapy: No side-effects or adverse reactions reported. Compliance: No problems identified. Effectiveness: Clinically acceptable.  Dewayne Shorter, RN  07/15/2021  9:11 AM  Sign when Signing Visit Nursing Pain Medication Assessment:  Safety precautions to be maintained throughout the outpatient stay will include: orient to surroundings, keep bed in low position, maintain call bell within reach at all times, provide assistance with transfer out of bed and ambulation.  Medication Inspection Compliance: Pill count conducted under aseptic conditions, in front of the patient. Neither the pills nor the bottle was removed from the patient's sight at any time. Once count was completed pills were immediately returned to the patient in their original bottle.  Medication: Oxycodone/APAP Pill/Patch Count:  3.5 of 60 pills remain Pill/Patch Appearance:  Markings consistent with prescribed medication Bottle Appearance: Standard pharmacy container. Clearly labeled. Filled Date: 07 / 06 / 2022 Last Medication intake:  Today  Morphine sulfate ER 15 mg 5/60 Filled 06/18/2021 today  Landis Martins, RN  07/15/2021  8:11 AM  Sign when Signing Visit Nursing Pain Medication Assessment:  Safety precautions to be maintained throughout the outpatient stay will include: orient to surroundings, keep bed in low position, maintain call bell within reach at all times, provide assistance with transfer out of bed and ambulation.  Medication Inspection Compliance: Emily Landry did not comply with our request to bring her pills to be counted. She was reminded that bringing the medication bottles, even when empty, is a requirement.  Medication: None brought in. Pill/Patch Count: None available to be counted. Bottle Appearance: No container available. Did not bring bottle(s) to appointment. Filled Date: N/A Last Medication intake:   Took Oxycodone and Morphine today.      UDS:  Summary  Date Value Ref Range Status  03/06/2021 Note  Final    Comment:    ==================================================================== [employer] [facility] (MW) ==================================================================== Test                             Result       Flag       Units  Drug Present and Declared for Prescription Verification   Oxycodone                      1031         EXPECTED   ng/mg creat   Oxymorphone                    760          EXPECTED   ng/mg creat   Noroxycodone                   4697         EXPECTED   ng/mg creat   Noroxymorphone                 277          EXPECTED   ng/mg creat    Sources of oxycodone are scheduled prescription medications.    Oxymorphone, noroxycodone, and noroxymorphone are expected    metabolites of oxycodone. Oxymorphone is also available as a    scheduled prescription medication.  Drug Present not  Declared for Prescription Verification   Morphine                       10231        UNEXPECTED ng/mg creat   Normorphine                    549          UNEXPECTED ng/mg creat    Potential sources of large amounts of morphine in the absence of    codeine include administration of morphine or use of heroin.     Normorphine is an expected metabolite of morphine.  ==================================================================== Test                      Result    Flag   Units      Ref Range   Creatinine  35               mg/dL      >=20 ==================================================================== Declared Medications:  The flagging and interpretation on this report are based on the  following declared medications.  Unexpected results may arise from  inaccuracies in the declared medications.   **Note: The testing scope of this panel includes these medications:   Oxycodone (Percocet)   **Note: The testing scope of this panel does not include the  following reported medications:   Acetaminophen (Percocet)  Buspirone (Buspar)  Duloxetine (Cymbalta)  Folic Acid  Gabapentin (Neurontin)  Hydroxychloroquine  Hydroxyzine (Atarax)  Methotrexate  Omeprazole (Prilosec)  Prednisone (Deltasone)  Triamcinolone (Kenalog)  Zolpidem (Ambien) ==================================================================== For clinical consultation, please call [phone]. ====================================================================      ROS  Constitutional: Denies any fever or chills Gastrointestinal: No reported hemesis, hematochezia, vomiting, or acute GI distress Musculoskeletal:  neck and low back pain Neurological: No reported episodes of acute onset apraxia, aphasia, dysarthria, agnosia, amnesia, paralysis, loss of coordination, or loss of consciousness  Medication Review  DULoxetine, Etanercept, Hydroxychloroquine Sulfate, buPROPion, folic acid,  methotrexate, morphine, omeprazole, oxyCODONE-acetaminophen, predniSONE, triamcinolone cream, and zolpidem  History Review  Allergy: Emily Landry has No Known Allergies. Drug: Emily Landry  reports no history of drug use. Alcohol:  reports current alcohol use. Tobacco:  reports that she has never smoked. She has never used smokeless tobacco. Social: Emily Landry  reports that she has never smoked. She has never used smokeless tobacco. She reports current alcohol use. She reports that she does not use drugs. Medical:  has a past medical history of Anxiety, Arthritis, Depression, and GERD (gastroesophageal reflux disease). Surgical: Emily Landry  has a past surgical history that includes Laminectomy (1971); Lumbar disc surgery (1991); Tonsillectomy; Cholecystectomy; and Abdominal hysterectomy. Family: family history includes Alcohol abuse in her father; Arthritis in her father; Breast cancer (age of onset: 93) in her mother; Diabetes in her mother; Gout in her father; Rheum arthritis in her mother.  Laboratory Chemistry Profile   Renal Lab Results  Component Value Date   BUN 11 05/21/2020   CREATININE 0.6 05/21/2020    Hepatic Lab Results  Component Value Date   AST 31 05/21/2020   ALT 22 05/21/2020   ALBUMIN 4.2 05/21/2020   ALKPHOS 71 05/21/2020    Electrolytes Lab Results  Component Value Date   NA 142 05/21/2020   K 4.9 05/21/2020   CL 104 05/21/2020   CALCIUM 9.1 05/21/2020    Bone No results found for: VD25OH, VD125OH2TOT, BZ1696VE9, FY1017PZ0, 25OHVITD1, 25OHVITD2, 25OHVITD3, TESTOFREE, TESTOSTERONE  Inflammation (CRP: Acute Phase) (ESR: Chronic Phase) Lab Results  Component Value Date   ESRSEDRATE 6 12/06/2019         Note: Above Lab results reviewed.    Physical Exam  General appearance: Well nourished, well developed, and well hydrated. In no apparent acute distress Mental status: Alert, oriented x 3 (person, place, & time)       Respiratory: No evidence of acute  respiratory distress Eyes: PERLA Vitals: BP (!) 148/85   Pulse 73   Temp (!) 97 F (36.1 C) (Temporal)   Resp 18   Ht _0  (1.753 m)   Wt 218 lb (98.9 kg)   SpO2 98%   BMI 32.19 kg/m  BMI: Estimated body mass index is 32.19 kg/m as calculated from the following:   Height as of this encounter: _1  (1.753 m).   Weight as of this encounter: 218 lb (  98.9 kg). Ideal: Ideal body weight: 66.2 kg (145 lb 15.1 oz) Adjusted ideal body weight: 79.3 kg (174 lb 12.3 oz)  Increased low back and left greater than right leg pain Myofascial pain syndrome of lumbar spine  5 out of 5 strength bilateral lower extremity: Plantar flexion, dorsiflexion, knee flexion, knee extension.   Assessment   Status Diagnosis  Controlled Persistent Controlled 1. Plantar fasciitis of right foot   2. Chronic radicular lumbar pain   3. PTSD (post-traumatic stress disorder)   4. Failed back surgical syndrome   5. Chronic pain of both shoulders   6. Lumbar post-laminectomy syndrome   7. Lumbar radiculopathy   8. Chronic pain syndrome       Plan of Care  Problem-specific:  No problem-specific Assessment & Plan notes found for this encounter.  [name] has a current medication list which includes the following long-term medication(s): bupropion, duloxetine, enbrel, omeprazole, and zolpidem.  Pharmacotherapy (Medications Ordered): Meds ordered this encounter  Medications   oxyCODONE-acetaminophen (PERCOCET) 10-325 MG tablet    Sig: Take 1 tablet by mouth every 12 (twelve) hours as needed for pain (breakthrough pain).    Dispense:  60 tablet    Refill:  0    For chronic pain syndrome   morphine (MS CONTIN) 15 MG 12 hr tablet    Sig: Take 1 tablet (15 mg total) by mouth every 12 (twelve) hours. Must last 30 days.    Dispense:  60 tablet    Refill:  0    [place] STOP ACT - Not applicable. Fill one day early if pharmacy is closed on scheduled refill date.   oxyCODONE-acetaminophen (PERCOCET)  10-325 MG tablet    Sig: Take 1 tablet by mouth every 12 (twelve) hours as needed for pain (breakthrough pain).    Dispense:  60 tablet    Refill:  0    For chronic pain syndrome   morphine (MS CONTIN) 15 MG 12 hr tablet    Sig: Take 1 tablet (15 mg total) by mouth every 12 (twelve) hours. Must last 30 days.    Dispense:  60 tablet    Refill:  0    [place] STOP ACT - Not applicable. Fill one day early if pharmacy is closed on scheduled refill date.    Orders:  Orders Placed This Encounter  Procedures   Lumbar Epidural Injection    Standing Status:   Standing    Number of Occurrences:   2    Standing Expiration Date:   01/15/2022    Scheduling Instructions:     Procedure: Interlaminar Lumbar Epidural Steroid injection (LESI)            Laterality: Midline     Sedation: Patient's choice.     Timeframe: ASAA    Order Specific Question:   Where will this procedure be performed?    Answer:   [facility]  PRN-L-ESI  Follow-up plan:   Return in about 7 weeks (around 09/04/2021) for Medication Management, in person.     Left L5-S1 ESI #1,  01/06/2021, 6 cc injected       Recent Visits Date Type Provider Dept  05/19/21 Procedure visit Gillis Santa, MD [facility]  04/29/21 Office Visit Gillis Santa, MD [facility]  Showing recent visits within past 90 days and meeting all other requirements Today's Visits Date Type Provider Dept  07/15/21 Office Visit Gillis Santa, MD [facility]  Showing today's visits and meeting all other  requirements Future Appointments No visits were found meeting these conditions. Showing future appointments within next 90 days and meeting all other requirements I discussed the assessment and treatment plan with the patient. The patient was provided an opportunity to ask questions and all were answered. The patient agreed with the plan and demonstrated an understanding of the instructions.  Patient advised to call back or  seek an in-person evaluation if the symptoms or condition worsens.  Duration of encounter: 75mnutes.  Note by: BGillis Santa MD Date: 07/15/2021; Time: 9:16 AM

## 2021-07-15 NOTE — Progress Notes (Signed)
Nursing Pain Medication Assessment:  Safety precautions to be maintained throughout the outpatient stay will include: orient to surroundings, keep bed in low position, maintain call bell within reach at all times, provide assistance with transfer out of bed and ambulation.  Medication Inspection Compliance: Emily Landry did not comply with our request to bring her pills to be counted. She was reminded that bringing the medication bottles, even when empty, is a requirement.  Medication: None brought in. Pill/Patch Count: None available to be counted. Bottle Appearance: No container available. Did not bring bottle(s) to appointment. Filled Date: N/A Last Medication intake:   Took Oxycodone and Morphine today.

## 2021-07-15 NOTE — Patient Instructions (Signed)

## 2021-07-15 NOTE — Progress Notes (Signed)
Nursing Pain Medication Assessment:  Safety precautions to be maintained throughout the outpatient stay will include: orient to surroundings, keep bed in low position, maintain call bell within reach at all times, provide assistance with transfer out of bed and ambulation.  Medication Inspection Compliance: Pill count conducted under aseptic conditions, in front of the patient. Neither the pills nor the bottle was removed from the patient's sight at any time. Once count was completed pills were immediately returned to the patient in their original bottle.  Medication: Oxycodone/APAP Pill/Patch Count:  3.5 of 60 pills remain Pill/Patch Appearance: Markings consistent with prescribed medication Bottle Appearance: Standard pharmacy container. Clearly labeled. Filled Date: 07 / 06 / 2022 Last Medication intake:  Today  Morphine sulfate ER 15 mg 5/60 Filled 06/18/2021 today

## 2021-07-15 NOTE — Progress Notes (Signed)
Virtual Visit via Video Note  I connected with Emily Landry on 07/15/21 at  3:00 PM EDT by a video enabled telemedicine application and verified that I am speaking with the correct person using two identifiers.  Location: Patient: home Provider: remote office [place], [place])   I discussed the limitations of evaluation and management by telemedicine and the availability of in person appointments. The patient expressed understanding and agreed to proceed.  I discussed the assessment and treatment plan with the patient. The patient was provided an opportunity to ask questions and all were answered. The patient agreed with the plan and demonstrated an understanding of the instructions.   The patient was advised to call back or seek an in-person evaluation if the symptoms worsen or if the condition fails to improve as anticipated.  I provided 60 minutes of non-face-to-face time during this encounter.   Lorice Lafave R Johncarlos Holtsclaw, LCSW   THERAPIST PROGRESS NOTE  Session Time: 3-4p  Participation Level: Active  Behavioral Response: NeatAlertAnxious and Depressed  Type of Therapy: Individual Therapy  Treatment Goals addressed: Anxiety and Coping  Interventions: CBT  Summary: Emily Landry is a 70 y.o. female who presents with improving symptoms related to PTSD diagnosis. Patient reports that she is managing situational stressors and anxiety symptoms well. Allowed patient safe space to explore and express thoughts and feelings associated with recent external stressors. Patient feels like she has stress associated with health-related expenses.  Patient reports that she is continuing to experience chronic pain in her back area, which she is currently being treated both medically and by a chiropractor.  Patient reports that she's often very tired and will sit down or lay down in the day animals often fall asleep. Patient reports that she will then have trouble sleeping later. Discussed sleep  hygiene, and development of a bedtime routine. Discussed patients  friendship with fellow churchgoer, and how this relationship has brought patient so much joy. Patient reports ongoing difficulties in relationship with husband. Allow patient to identify behaviors in her husband that had been concerning and that have triggered negative faults about herself and negative feelings about herself throughout the years. Discussed ways of coping, and ways of patient to prioritize her own thoughts and her own needs. Discussed overall belief systems, and how they are developed..   Suicidal/Homicidal: No  Therapist Response: The ongoing treatment plan includes maintaining current levels of progress and continuing to build skills to manage mood, improve stress/anxiety management, emotion regulation, distress tolerance, and behavior modification.    Plan: Return again in 4 weeks.  Diagnosis: Axis I: MDD, recurrent, moderate    Axis II: No diagnosis    [name] Judy Goodenow, LCSW 07/15/2021

## 2021-07-19 ENCOUNTER — Encounter: Payer: Self-pay | Admitting: Student in an Organized Health Care Education/Training Program
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
## 2021-07-29 ENCOUNTER — Telehealth (INDEPENDENT_AMBULATORY_CARE_PROVIDER_SITE_OTHER): Payer: Medicare PPO | Admitting: Psychiatry

## 2021-07-29 ENCOUNTER — Encounter: Payer: Self-pay | Admitting: Psychiatry

## 2021-07-29 ENCOUNTER — Other Ambulatory Visit: Payer: Self-pay

## 2021-07-29 DIAGNOSIS — F4310 Post-traumatic stress disorder, unspecified: Secondary | ICD-10-CM

## 2021-07-29 DIAGNOSIS — F331 Major depressive disorder, recurrent, moderate: Secondary | ICD-10-CM

## 2021-07-29 MED ORDER — DULOXETINE HCL 60 MG PO CPEP: 60.0000 mg | ORAL_CAPSULE | Freq: Every day | ORAL | 1 refills | Status: DC

## 2021-07-29 MED ORDER — BUPROPION HCL ER (XL) 300 MG PO TB24: 300.0000 mg | ORAL_TABLET | Freq: Every day | ORAL | 0 refills | Status: DC

## 2021-07-29 MED ORDER — DULOXETINE HCL 30 MG PO CPEP: ORAL_CAPSULE | ORAL | 2 refills | Status: DC

## 2021-07-29 NOTE — Patient Instructions (Addendum)
1. Continue bupropion 300 mg daily 2. Increase duloxetine 90 mg daily 3. Next appointment: 10/24 at 10 AM, in person   The next visit will be in person visit. Please arrive 15 mins before the scheduled time.   [facility]  Address: [address], [place], [place] [address]

## 2021-07-30 ENCOUNTER — Telehealth (HOSPITAL_COMMUNITY): Payer: Self-pay | Admitting: Psychiatry

## 2021-07-30 NOTE — Telephone Encounter (Signed)
D:  Dr. Vanetta Shawl referred pt to [facility].  A:  Placed call to orient pt and provide her with a start date, but pt declined.  "I'll be upfront with you I told her that I would listen about the program, but I don't want to attend.  Encouraged pt to call the case manager if she changes her mind in the future.  Inform Dr. Vanetta Shawl.

## 2021-08-06 DIAGNOSIS — M79671 Pain in right foot: Secondary | ICD-10-CM | POA: Diagnosis not present

## 2021-08-06 DIAGNOSIS — M19071 Primary osteoarthritis, right ankle and foot: Secondary | ICD-10-CM | POA: Diagnosis not present

## 2021-08-06 DIAGNOSIS — Z20822 Contact with and (suspected) exposure to covid-19: Secondary | ICD-10-CM | POA: Diagnosis not present

## 2021-08-06 DIAGNOSIS — M25571 Pain in right ankle and joints of right foot: Secondary | ICD-10-CM | POA: Diagnosis not present

## 2021-08-06 DIAGNOSIS — Z03818 Encounter for observation for suspected exposure to other biological agents ruled out: Secondary | ICD-10-CM | POA: Diagnosis not present

## 2021-08-06 DIAGNOSIS — M722 Plantar fascial fibromatosis: Secondary | ICD-10-CM | POA: Diagnosis not present

## 2021-08-09 ENCOUNTER — Other Ambulatory Visit: Payer: Self-pay | Admitting: Family Medicine

## 2021-08-09 DIAGNOSIS — F411 Generalized anxiety disorder: Secondary | ICD-10-CM

## 2021-08-09 DIAGNOSIS — G4700 Insomnia, unspecified: Secondary | ICD-10-CM

## 2021-08-11 NOTE — Telephone Encounter (Signed)
Last refilled on 07/14/21 # 30 with 0 refill No future appointments.  Per last refill note Dr Selena Batten wanted patient to make an appointment for follow up before farther refills.  Please schedule.

## 2021-08-12 NOTE — Telephone Encounter (Signed)
Called pt she scheduled an appt for 9/13

## 2021-08-19 ENCOUNTER — Ambulatory Visit: Payer: Medicare PPO | Admitting: Licensed Clinical Social Worker

## 2021-08-26 ENCOUNTER — Ambulatory Visit: Payer: Medicare PPO | Admitting: Family Medicine

## 2021-08-26 ENCOUNTER — Encounter: Payer: Self-pay | Admitting: Family Medicine

## 2021-08-26 NOTE — Telephone Encounter (Signed)
Pt has been rescheduled. 

## 2021-09-01 ENCOUNTER — Encounter: Payer: Self-pay | Admitting: Student in an Organized Health Care Education/Training Program

## 2021-09-02 ENCOUNTER — Other Ambulatory Visit: Payer: Self-pay

## 2021-09-02 ENCOUNTER — Telehealth (INDEPENDENT_AMBULATORY_CARE_PROVIDER_SITE_OTHER): Payer: Medicare PPO | Admitting: Family Medicine

## 2021-09-02 ENCOUNTER — Encounter: Payer: Self-pay | Admitting: Family Medicine

## 2021-09-02 DIAGNOSIS — G4700 Insomnia, unspecified: Secondary | ICD-10-CM

## 2021-09-02 DIAGNOSIS — F411 Generalized anxiety disorder: Secondary | ICD-10-CM

## 2021-09-02 MED ORDER — ZOLPIDEM TARTRATE ER 6.25 MG PO TBCR: 6.2500 mg | EXTENDED_RELEASE_TABLET | Freq: Every evening | ORAL | 0 refills | Status: DC | PRN

## 2021-09-02 NOTE — Assessment & Plan Note (Signed)
Poorly controlled. Likely closely related to anxiety. Ambien helps her fall asleep but does not keep her asleep. Will try Ambien CR and see if this improves or lunesta if not covered by insurance. Will also encourage continued anxiety management.

## 2021-09-02 NOTE — Assessment & Plan Note (Signed)
Pt notes anxiety is contributing to sleepless nights. Following with psych and saw them 07/2021 with increase in Duloxetine 90 mg daily and bupropion 300 mg daily. Will reach out to psych to see if can see her sooner. Requests benzos today but discussed risk with opiates and that I would not recommend being on both.

## 2021-09-02 NOTE — Progress Notes (Signed)
I connected with Emily Landry  on 09/02/21 at  9:20 AM EDT by video and verified that I am speaking with the correct person using two identifiers.   I discussed the limitations, risks, security and privacy concerns of performing an evaluation and management service by video and the availability of in person appointments. I also discussed with the patient that there may be a patient responsible charge related to this service. The patient expressed understanding and agreed to proceed.  Patient location: Home Provider Location: [place] Participants: Lynnda Child and Emily Landry   Subjective:     Emily Landry is a 70 y.o. female presenting for Medication Refill     HPI  #Insomnia - taking Remus Loffler - still working ok - this will initiate sleep but still having difficulty staying asleep - Previous treatment: xanax w/ improvement - in the past ambien worked better - but not sure   #anxiety - hydroxyzine was ineffective due to side effects - did not like how it made her feel - talked with pharmacist and tried lower doses  - dealing with anxiety throughout the day on a regularly basis - husband is having a lot of health issues right now - still trying to figure out why he is not doing well or the cause - found cancer on kidney - unable to get cancer care due to lungs  Review of Systems   Social History   Tobacco Use  Smoking Status Never  Smokeless Tobacco Never        Objective:   BP Readings from Last 3 Encounters:  07/15/21 (!) 148/85  05/19/21 (!) 142/74  04/29/21 (!) 143/63   Wt Readings from Last 3 Encounters:  07/15/21 218 lb (98.9 kg)  05/19/21 220 lb (99.8 kg)  04/29/21 220 lb (99.8 kg)    There were no vitals taken for this visit.  Physical Exam Constitutional:      Appearance: Normal appearance. She is not ill-appearing.  HENT:     Head: Normocephalic and atraumatic.     Right Ear: External ear normal.     Left Ear:  External ear normal.  Eyes:     Conjunctiva/sclera: Conjunctivae normal.  Pulmonary:     Effort: Pulmonary effort is normal. No respiratory distress.  Neurological:     Mental Status: She is alert. Mental status is at baseline.  Psychiatric:        Mood and Affect: Mood normal.        Behavior: Behavior normal.        Thought Content: Thought content normal.        Judgment: Judgment normal.         Assessment & Plan:   Problem List Items Addressed This Visit       Other   Insomnia    Poorly controlled. Likely closely related to anxiety. Ambien helps her fall asleep but does not keep her asleep. Will try Ambien CR and see if this improves or lunesta if not covered by insurance. Will also encourage continued anxiety management.       Relevant Medications   zolpidem (AMBIEN CR) 6.25 MG CR tablet   Generalized anxiety disorder - Primary    Pt notes anxiety is contributing to sleepless nights. Following with psych and saw them 07/2021 with increase in Duloxetine 90 mg daily and bupropion 300 mg daily. Will reach out to psych to see if can see her sooner. Requests benzos today but discussed risk with opiates  and that I would not recommend being on both.         Return if symptoms worsen or fail to improve.  Lynnda Child, MD

## 2021-09-08 ENCOUNTER — Other Ambulatory Visit: Payer: Self-pay | Admitting: Family Medicine

## 2021-09-08 DIAGNOSIS — F411 Generalized anxiety disorder: Secondary | ICD-10-CM

## 2021-09-08 DIAGNOSIS — G4700 Insomnia, unspecified: Secondary | ICD-10-CM

## 2021-09-12 ENCOUNTER — Other Ambulatory Visit: Payer: Self-pay | Admitting: Gastroenterology

## 2021-09-15 ENCOUNTER — Other Ambulatory Visit: Payer: Self-pay

## 2021-09-15 ENCOUNTER — Ambulatory Visit
Payer: Medicare PPO | Attending: Student in an Organized Health Care Education/Training Program | Admitting: Student in an Organized Health Care Education/Training Program

## 2021-09-15 ENCOUNTER — Encounter: Payer: Self-pay | Admitting: Student in an Organized Health Care Education/Training Program

## 2021-09-15 VITALS — BP 160/87 | HR 65 | Temp 97.3°F | Resp 18 | Ht 69.5 in | Wt 205.0 lb

## 2021-09-15 DIAGNOSIS — M5416 Radiculopathy, lumbar region: Secondary | ICD-10-CM | POA: Diagnosis not present

## 2021-09-15 DIAGNOSIS — G894 Chronic pain syndrome: Secondary | ICD-10-CM | POA: Insufficient documentation

## 2021-09-15 DIAGNOSIS — M25512 Pain in left shoulder: Secondary | ICD-10-CM | POA: Insufficient documentation

## 2021-09-15 DIAGNOSIS — M25511 Pain in right shoulder: Secondary | ICD-10-CM | POA: Insufficient documentation

## 2021-09-15 DIAGNOSIS — M722 Plantar fascial fibromatosis: Secondary | ICD-10-CM | POA: Insufficient documentation

## 2021-09-15 DIAGNOSIS — F4310 Post-traumatic stress disorder, unspecified: Secondary | ICD-10-CM | POA: Insufficient documentation

## 2021-09-15 DIAGNOSIS — G8929 Other chronic pain: Secondary | ICD-10-CM | POA: Diagnosis not present

## 2021-09-15 DIAGNOSIS — M961 Postlaminectomy syndrome, not elsewhere classified: Secondary | ICD-10-CM | POA: Diagnosis not present

## 2021-09-15 MED ORDER — OXYCODONE-ACETAMINOPHEN 10-325 MG PO TABS: 1.0000 | ORAL_TABLET | Freq: Two times a day (BID) | ORAL | 0 refills | Status: AC | PRN

## 2021-09-15 MED ORDER — OXYCODONE-ACETAMINOPHEN 10-325 MG PO TABS: 1.0000 | ORAL_TABLET | Freq: Two times a day (BID) | ORAL | 0 refills | Status: DC | PRN

## 2021-09-15 MED ORDER — MORPHINE SULFATE ER 15 MG PO TBCR: 15.0000 mg | EXTENDED_RELEASE_TABLET | Freq: Two times a day (BID) | ORAL | 0 refills | Status: AC

## 2021-09-15 MED ORDER — MORPHINE SULFATE ER 15 MG PO TBCR: 15.0000 mg | EXTENDED_RELEASE_TABLET | Freq: Two times a day (BID) | ORAL | 0 refills | Status: DC

## 2021-09-15 MED ORDER — GABAPENTIN 400 MG PO CAPS: 400.0000 mg | ORAL_CAPSULE | Freq: Three times a day (TID) | ORAL | 2 refills | Status: DC

## 2021-09-15 NOTE — Progress Notes (Signed)
Nursing Pain Medication Assessment:  Safety precautions to be maintained throughout the outpatient stay will include: orient to surroundings, keep bed in low position, maintain call bell within reach at all times, provide assistance with transfer out of bed and ambulation.  Medication Inspection Compliance: Pill count conducted under aseptic conditions, in front of the patient. Neither the pills nor the bottle was removed from the patient's sight at any time. Once count was completed pills were immediately returned to the patient in their original bottle.  Medication: Morphine IR Pill/Patch Count:  3 of 60 pills remain Pill/Patch Appearance: Markings consistent with prescribed medication Bottle Appearance: Standard pharmacy container. Clearly labeled. Filled Date: 17 / 4 / 2022 Last Medication intake:  Yesterday   Oxycodone apap 10-325 Filled 08/17/21  Last took today

## 2021-09-15 NOTE — Progress Notes (Signed)
PROVIDER NOTE: Information contained herein reflects review and annotations entered in association with encounter. Interpretation of such information and data should be left to medically-trained personnel. Information provided to patient can be located elsewhere in the medical record under "Patient Instructions". Document created using STT-dictation technology, any transcriptional errors that may result from process are unintentional.    Patient: Emily Landry  Service Category: E/M  Provider: Gillis Santa, MD  DOB: 1951/11/02  DOS: 09/15/2021  Specialty: Interventional Pain Management  MRN: [id number]  Setting: Ambulatory outpatient  PCP: Lesleigh Noe, MD  Type: Established Patient    Referring Provider: Lesleigh Noe, MD  Location: Office  Delivery: Face-to-face     HPI  Ms. Faun Mcqueen, a 70 y.o. year old female, is here today because of her Plantar fasciitis of right foot [M72.2]. Ms. Neace primary complain today is Back Pain (lower) Last encounter: My last encounter with her was on 07/15/21 Pertinent problems: Ms. Magistro does not have any pertinent problems on file. Pain Assessment: Severity of Chronic pain is reported as a 6 /10. Location: Back Lower/front of leg down to great toe. Onset: More than a month ago. Quality: Aching, Sharp, Shooting. Timing: Constant. Modifying factor(s): meds, rest. Vitals:  height is 5' 9.5" (1.765 m) and weight is 205 lb (93 kg). Her temperature is 97.3 F (36.3 C) (abnormal). Her blood pressure is 160/87 (abnormal) and her pulse is 65. Her respiration is 18 and oxygen saturation is 99%.   Reason for encounter: medication management.    Carollee presents today for medication management.  No significant change in her medical history.  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  She continues her morphine and Percocet as prescribed.  She continues to work with psychiatry.  At her previous appointment, her  Cymbalta was increased at 90 mg for depression management.  She continues her gabapentin 400 mg twice daily to 3 times daily.  I cautioned her on use of Ambien with her sedative medications.  She states that she does not take Ambien every night.  Otherwise she does not need an ankle injection or an epidural at this time.  She is practicing intermittent fasting and has lost some weight from that practice.  Pharmacotherapy Assessment  Analgesic: Morphine 15 mg twice daily, MME equals 30; Percocet 10 mg twice daily as needed for breakthrough pain,; MME equals 30.  Total MME equals 60    Monitoring: [place] PMP: PDMP reviewed during this encounter.       Pharmacotherapy: No side-effects or adverse reactions reported. Compliance: No problems identified. Effectiveness: Clinically acceptable.  Ignatius Specking, RN  09/15/2021  8:28 AM  Sign when Signing Visit Nursing Pain Medication Assessment:  Safety precautions to be maintained throughout the outpatient stay will include: orient to surroundings, keep bed in low position, maintain call bell within reach at all times, provide assistance with transfer out of bed and ambulation.  Medication Inspection Compliance: Pill count conducted under aseptic conditions, in front of the patient. Neither the pills nor the bottle was removed from the patient's sight at any time. Once count was completed pills were immediately returned to the patient in their original bottle.  Medication: Morphine IR Pill/Patch Count:  3 of 60 pills remain Pill/Patch Appearance: Markings consistent with prescribed medication Bottle Appearance: Standard pharmacy container. Clearly labeled. Filled Date: 91 / 4 / 2022 Last Medication intake:  Yesterday   Oxycodone apap 10-325 Filled 08/17/21  Last took  today     UDS:  Summary  Date Value Ref Range Status  03/06/2021 Note  Final    Comment:    ==================================================================== [employer] Select 13  (MW) ==================================================================== Test                             Result       Flag       Units  Drug Present and Declared for Prescription Verification   Oxycodone                      1031         EXPECTED   ng/mg creat   Oxymorphone                    760          EXPECTED   ng/mg creat   Noroxycodone                   4697         EXPECTED   ng/mg creat   Noroxymorphone                 277          EXPECTED   ng/mg creat    Sources of oxycodone are scheduled prescription medications.    Oxymorphone, noroxycodone, and noroxymorphone are expected    metabolites of oxycodone. Oxymorphone is also available as a    scheduled prescription medication.  Drug Present not Declared for Prescription Verification   Morphine                       10231        UNEXPECTED ng/mg creat   Normorphine                    549          UNEXPECTED ng/mg creat    Potential sources of large amounts of morphine in the absence of    codeine include administration of morphine or use of heroin.     Normorphine is an expected metabolite of morphine.  ==================================================================== Test                      Result    Flag   Units      Ref Range   Creatinine              35               mg/dL      >=20 ==================================================================== Declared Medications:  The flagging and interpretation on this report are based on the  following declared medications.  Unexpected results may arise from  inaccuracies in the declared medications.   **Note: The testing scope of this panel includes these medications:   Oxycodone (Percocet)   **Note: The testing scope of this panel does not include the  following reported medications:   Acetaminophen (Percocet)  Buspirone (Buspar)  Duloxetine (Cymbalta)  Folic Acid  Gabapentin (Neurontin)  Hydroxychloroquine  Hydroxyzine (Atarax)  Methotrexate  Omeprazole  (Prilosec)  Prednisone (Deltasone)  Triamcinolone (Kenalog)  Zolpidem (Ambien) ==================================================================== For clinical consultation, please call [phone]. ====================================================================      ROS  Constitutional: Denies any fever or chills Gastrointestinal: No reported hemesis, hematochezia, vomiting, or acute GI distress Musculoskeletal:  neck and low back pain Neurological: No  reported episodes of acute onset apraxia, aphasia, dysarthria, agnosia, amnesia, paralysis, loss of coordination, or loss of consciousness  Medication Review  DULoxetine, Etanercept, buPROPion, folic acid, gabapentin, methotrexate, morphine, omeprazole, oxyCODONE-acetaminophen, triamcinolone cream, and zolpidem  History Review  Allergy: Ms. Salomone has No Known Allergies. Drug: Ms. Winbush  reports no history of drug use. Alcohol:  reports current alcohol use. Tobacco:  reports that she has never smoked. She has never used smokeless tobacco. Social: Ms. Reinertsen  reports that she has never smoked. She has never used smokeless tobacco. She reports current alcohol use. She reports that she does not use drugs. Medical:  has a past medical history of Anxiety, Arthritis, Depression, and GERD (gastroesophageal reflux disease). Surgical: Ms. Beattie  has a past surgical history that includes Laminectomy (1971); Lumbar disc surgery (1991); Tonsillectomy; Cholecystectomy; and Abdominal hysterectomy. Family: family history includes Alcohol abuse in her father; Arthritis in her father; Breast cancer (age of onset: 75) in her mother; Diabetes in her mother; Gout in her father; Rheum arthritis in her mother.  Laboratory Chemistry Profile   Renal Lab Results  Component Value Date   BUN 11 05/21/2020   CREATININE 0.6 05/21/2020    Hepatic Lab Results  Component Value Date   AST 31 05/21/2020   ALT 22 05/21/2020   ALBUMIN 4.2  05/21/2020   ALKPHOS 71 05/21/2020    Electrolytes Lab Results  Component Value Date   NA 142 05/21/2020   K 4.9 05/21/2020   CL 104 05/21/2020   CALCIUM 9.1 05/21/2020    Bone No results found for: VD25OH, VD125OH2TOT, DX4128NO6, VE7209OB0, 25OHVITD1, 25OHVITD2, 25OHVITD3, TESTOFREE, TESTOSTERONE  Inflammation (CRP: Acute Phase) (ESR: Chronic Phase) Lab Results  Component Value Date   ESRSEDRATE 6 12/06/2019         Note: Above Lab results reviewed.    Physical Exam  General appearance: Well nourished, well developed, and well hydrated. In no apparent acute distress Mental status: Alert, oriented x 3 (person, place, & time)       Respiratory: No evidence of acute respiratory distress Eyes: PERLA Vitals: BP (!) 160/87   Pulse 65   Temp (!) 97.3 F (36.3 C)   Resp 18   Ht 5' 9.5" (1.765 m)   Wt 205 lb (93 kg)   SpO2 99%   BMI 29.84 kg/m  BMI: Estimated body mass index is 29.84 kg/m as calculated from the following:   Height as of this encounter: 5' 9.5" (1.765 m).   Weight as of this encounter: 205 lb (93 kg). Ideal: Ideal body weight: 67.4 kg (148 lb 7.7 oz) Adjusted ideal body weight: 77.6 kg (171 lb 1.4 oz)   Myofascial pain syndrome of lumbar spine  5 out of 5 strength bilateral lower extremity: Plantar flexion, dorsiflexion, knee flexion, knee extension.   Assessment   Status Diagnosis  Controlled Controlled Controlled 1. Plantar fasciitis of right foot   2. Chronic radicular lumbar pain   3. PTSD (post-traumatic stress disorder)   4. Lumbar post-laminectomy syndrome   5. Chronic pain of both shoulders   6. Chronic pain syndrome   7. Lumbar radiculopathy   8. Failed back surgical syndrome       Plan of Care  Problem-specific:  No problem-specific Assessment & Plan notes found for this encounter.  Ms. Cyleigh Massaro has a current medication list which includes the following long-term medication(s): bupropion, duloxetine, enbrel, gabapentin,  omeprazole, zolpidem, and duloxetine.  Pharmacotherapy (Medications Ordered): Meds ordered this encounter  Medications  oxyCODONE-acetaminophen (PERCOCET) 10-325 MG tablet    Sig: Take 1 tablet by mouth every 12 (twelve) hours as needed for pain (breakthrough pain).    Dispense:  60 tablet    Refill:  0    For chronic pain syndrome   oxyCODONE-acetaminophen (PERCOCET) 10-325 MG tablet    Sig: Take 1 tablet by mouth every 12 (twelve) hours as needed for pain (breakthrough pain).    Dispense:  60 tablet    Refill:  0    For chronic pain syndrome   oxyCODONE-acetaminophen (PERCOCET) 10-325 MG tablet    Sig: Take 1 tablet by mouth every 12 (twelve) hours as needed for pain (breakthrough pain).    Dispense:  60 tablet    Refill:  0    For chronic pain syndrome   morphine (MS CONTIN) 15 MG 12 hr tablet    Sig: Take 1 tablet (15 mg total) by mouth every 12 (twelve) hours. Must last 30 days.    Dispense:  60 tablet    Refill:  0    [place] STOP ACT - Not applicable. Fill one day early if pharmacy is closed on scheduled refill date.   morphine (MS CONTIN) 15 MG 12 hr tablet    Sig: Take 1 tablet (15 mg total) by mouth every 12 (twelve) hours. Must last 30 days.    Dispense:  60 tablet    Refill:  0    [place] STOP ACT - Not applicable. Fill one day early if pharmacy is closed on scheduled refill date.   morphine (MS CONTIN) 15 MG 12 hr tablet    Sig: Take 1 tablet (15 mg total) by mouth every 12 (twelve) hours. Must last 30 days.    Dispense:  60 tablet    Refill:  0     STOP ACT - Not applicable. Fill one day early if pharmacy is closed on scheduled refill date.   gabapentin (NEURONTIN) 400 MG capsule    Sig: Take 1 capsule (400 mg total) by mouth 3 (three) times daily.    Dispense:  120 capsule    Refill:  2    Orders:  No orders of the defined types were placed in this encounter. PRN-L-ESI  Follow-up plan:   Return in about 3 months (around 12/16/2021) for Medication Management, in  person.     Left L5-S1 ESI #1,  01/06/2021, 6 cc injected       Recent Visits Date Type Provider Dept  07/15/21 Office Visit Gillis Santa, MD [facility]  Showing recent visits within past 90 days and meeting all other requirements Today's Visits Date Type Provider Dept  09/15/21 Office Visit Gillis Santa, MD [facility]  Showing today's visits and meeting all other requirements Future Appointments No visits were found meeting these conditions. Showing future appointments within next 90 days and meeting all other requirements I discussed the assessment and treatment plan with the patient. The patient was provided an opportunity to ask questions and all were answered. The patient agreed with the plan and demonstrated an understanding of the instructions.  Patient advised to call back or seek an in-person evaluation if the symptoms or condition worsens.  Duration of encounter: 15mnutes.  Note by: BGillis Santa MD Date: 09/15/2021; Time: 8:45 AM

## 2021-09-17 DIAGNOSIS — Z79899 Other long term (current) drug therapy: Secondary | ICD-10-CM | POA: Diagnosis not present

## 2021-09-17 DIAGNOSIS — M19042 Primary osteoarthritis, left hand: Secondary | ICD-10-CM | POA: Diagnosis not present

## 2021-09-17 DIAGNOSIS — M19041 Primary osteoarthritis, right hand: Secondary | ICD-10-CM | POA: Diagnosis not present

## 2021-09-17 DIAGNOSIS — M0609 Rheumatoid arthritis without rheumatoid factor, multiple sites: Secondary | ICD-10-CM | POA: Diagnosis not present

## 2021-09-30 ENCOUNTER — Encounter: Payer: Self-pay | Admitting: Student in an Organized Health Care Education/Training Program

## 2021-10-01 ENCOUNTER — Ambulatory Visit
Payer: Medicare PPO | Attending: Pain Medicine | Admitting: Student in an Organized Health Care Education/Training Program

## 2021-10-01 ENCOUNTER — Encounter: Payer: Self-pay | Admitting: Student in an Organized Health Care Education/Training Program

## 2021-10-01 ENCOUNTER — Other Ambulatory Visit: Payer: Self-pay

## 2021-10-01 VITALS — BP 147/83 | HR 79 | Temp 97.7°F | Resp 15 | Ht 69.0 in | Wt 210.0 lb

## 2021-10-01 DIAGNOSIS — M5416 Radiculopathy, lumbar region: Secondary | ICD-10-CM | POA: Insufficient documentation

## 2021-10-01 DIAGNOSIS — G894 Chronic pain syndrome: Secondary | ICD-10-CM | POA: Diagnosis not present

## 2021-10-01 DIAGNOSIS — M25511 Pain in right shoulder: Secondary | ICD-10-CM | POA: Insufficient documentation

## 2021-10-01 DIAGNOSIS — G588 Other specified mononeuropathies: Secondary | ICD-10-CM | POA: Diagnosis not present

## 2021-10-01 DIAGNOSIS — G8929 Other chronic pain: Secondary | ICD-10-CM | POA: Diagnosis not present

## 2021-10-01 DIAGNOSIS — M25512 Pain in left shoulder: Secondary | ICD-10-CM | POA: Diagnosis not present

## 2021-10-01 DIAGNOSIS — F4310 Post-traumatic stress disorder, unspecified: Secondary | ICD-10-CM | POA: Diagnosis not present

## 2021-10-01 DIAGNOSIS — M722 Plantar fascial fibromatosis: Secondary | ICD-10-CM | POA: Diagnosis not present

## 2021-10-01 DIAGNOSIS — M961 Postlaminectomy syndrome, not elsewhere classified: Secondary | ICD-10-CM | POA: Diagnosis not present

## 2021-10-01 MED ORDER — KETOROLAC TROMETHAMINE 30 MG/ML IJ SOLN
INTRAMUSCULAR | Status: AC
  Filled 2021-10-01: qty 1

## 2021-10-01 MED ORDER — ORPHENADRINE CITRATE 30 MG/ML IJ SOLN
INTRAMUSCULAR | Status: AC
  Filled 2021-10-01: qty 2

## 2021-10-01 MED ORDER — KETOROLAC TROMETHAMINE 30 MG/ML IJ SOLN
30.0000 mg | Freq: Once | INTRAMUSCULAR | Status: AC
  Administered 2021-10-01: 30 mg via INTRAMUSCULAR

## 2021-10-01 MED ORDER — ORPHENADRINE CITRATE 30 MG/ML IJ SOLN
30.0000 mg | Freq: Once | INTRAMUSCULAR | Status: AC
  Administered 2021-10-01: 60 mg via INTRAMUSCULAR

## 2021-10-01 NOTE — Progress Notes (Signed)
PROVIDER NOTE: Information contained herein reflects review and annotations entered in association with encounter. Interpretation of such information and data should be left to medically-trained personnel. Information provided to patient can be located elsewhere in the medical record under "Patient Instructions". Document created using STT-dictation technology, any transcriptional errors that may result from process are unintentional.    Patient: Emily Landry  Service Category: E/M  Provider: Gillis Santa, MD  DOB: 1951-05-16  DOS: 10/01/2021  Specialty: Interventional Pain Management  MRN: [id number]  Setting: Ambulatory outpatient  PCP: Emily Noe, MD  Type: Established Patient    Referring Provider: Lesleigh Noe, MD  Location: Office  Delivery: Face-to-face     HPI  [name], a 70 y.o. 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 [name] primary complain today is Pain (Right flank to right abdomen) Last encounter: My last encounter with her was on 09/15/2021.  Pain Assessment: Severity of   is reported as a 7 /10. Location: Flank Right/to right mid abdomen. Onset: In the past 7 days. Quality: Sharp. Timing: Constant. Modifying factor(s):  Marland Kitchen Vitals:  height is _0  (1.753 m) and weight is 210 lb (95.3 kg). Her temporal temperature is 97.7 F (36.5 C). Her blood pressure is 147/83 (abnormal) and her pulse is 79. Her respiration is 15 and oxygen saturation is 97%.   Reason for encounter:   Increased right intercostal pain as well as right abdominal pain that radiates anteriorly.  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  History of thoracic shingles as well.  Discussed intercostal nerve block with the patient if symptoms do not improve.  Intramuscular Norflex and Toradol today for acute on chronic pain flare.  Pharmacotherapy Assessment  Analgesic: Morphine 15 mg twice daily, MME equals 30;  Percocet 10 mg twice daily as needed for breakthrough pain,; MME equals 30.  Total MME equals 60    Monitoring: [place] PMP: PDMP reviewed during this encounter.       Pharmacotherapy: No side-effects or adverse reactions reported. Compliance: No problems identified. Effectiveness: Clinically acceptable.  Rise Patience, RN  10/01/2021  2:27 PM  Sign when Signing Visit Safety precautions to be maintained throughout the outpatient stay will include: orient to surroundings, keep bed in low position, maintain call bell within reach at all times, provide assistance with transfer out of bed and ambulation.    UDS:  Summary  Date Value Ref Range Status  03/06/2021 Note  Final    Comment:    ==================================================================== [employer] Select 13 (MW) ==================================================================== Test                             Result       Flag       Units  Drug Present and Declared for Prescription Verification   Oxycodone                      1031         EXPECTED   ng/mg creat   Oxymorphone                    760          EXPECTED   ng/mg creat   Noroxycodone                   4697  EXPECTED   ng/mg creat   Noroxymorphone                 277          EXPECTED   ng/mg creat    Sources of oxycodone are scheduled prescription medications.    Oxymorphone, noroxycodone, and noroxymorphone are expected    metabolites of oxycodone. Oxymorphone is also available as a    scheduled prescription medication.  Drug Present not Declared for Prescription Verification   Morphine                       10231        UNEXPECTED ng/mg creat   Normorphine                    549          UNEXPECTED ng/mg creat    Potential sources of large amounts of morphine in the absence of    codeine include administration of morphine or use of heroin.     Normorphine is an expected metabolite of  morphine.  ==================================================================== Test                      Result    Flag   Units      Ref Range   Creatinine              35               mg/dL      >=20 ==================================================================== Declared Medications:  The flagging and interpretation on this report are based on the  following declared medications.  Unexpected results may arise from  inaccuracies in the declared medications.   **Note: The testing scope of this panel includes these medications:   Oxycodone (Percocet)   **Note: The testing scope of this panel does not include the  following reported medications:   Acetaminophen (Percocet)  Buspirone (Buspar)  Duloxetine (Cymbalta)  Folic Acid  Gabapentin (Neurontin)  Hydroxychloroquine  Hydroxyzine (Atarax)  Methotrexate  Omeprazole (Prilosec)  Prednisone (Deltasone)  Triamcinolone (Kenalog)  Zolpidem (Ambien) ==================================================================== For clinical consultation, please call [phone]. ====================================================================      ROS  Constitutional: Denies any fever or chills Gastrointestinal: No reported hemesis, hematochezia, vomiting, or acute GI distress Musculoskeletal:  Right intercostal pain Neurological: No reported episodes of acute onset apraxia, aphasia, dysarthria, agnosia, amnesia, paralysis, loss of coordination, or loss of consciousness  Medication Review  DULoxetine, Etanercept, buPROPion, folic acid, gabapentin, methotrexate, morphine, omeprazole, oxyCODONE-acetaminophen, triamcinolone cream, and zolpidem  History Review  Allergy: [name] has No Known Allergies. Drug: [name]  reports no history of drug use. Alcohol:  reports current alcohol use. Tobacco:  reports that she has never smoked. She has never used smokeless tobacco. Social: [name]  reports that she has never  smoked. She has never used smokeless tobacco. She reports current alcohol use. She reports that she does not use drugs. Medical:  has a past medical history of Anxiety, Arthritis, Depression, and GERD (gastroesophageal reflux disease). Surgical: [name]  has a past surgical history that includes Laminectomy (1971); Lumbar disc surgery (1991); Tonsillectomy; Cholecystectomy; and Abdominal hysterectomy. Family: family history includes Alcohol abuse in her father; Arthritis in her father; Breast cancer (age of onset: 51) in her mother; Diabetes in her mother; Gout in her father; Rheum arthritis in her mother.  Laboratory Chemistry Profile   Renal Lab Results  Component Value  Date   BUN 11 05/21/2020   CREATININE 0.6 05/21/2020    Hepatic Lab Results  Component Value Date   AST 31 05/21/2020   ALT 22 05/21/2020   ALBUMIN 4.2 05/21/2020   ALKPHOS 71 05/21/2020    Electrolytes Lab Results  Component Value Date   NA 142 05/21/2020   K 4.9 05/21/2020   CL 104 05/21/2020   CALCIUM 9.1 05/21/2020    Bone No results found for: VD25OH, VD125OH2TOT, [id number], ML4650PT4, 25OHVITD1, 25OHVITD2, 25OHVITD3, TESTOFREE, TESTOSTERONE  Inflammation (CRP: Acute Phase) (ESR: Chronic Phase) Lab Results  Component Value Date   ESRSEDRATE 6 12/06/2019         Note: Above Lab results reviewed.   Physical Exam  General appearance: Well nourished, well developed, and well hydrated. In no apparent acute distress Mental status: Alert, oriented x 3 (person, place, & time)       Respiratory: No evidence of acute respiratory distress Eyes: PERLA Vitals: BP (!) 147/83   Pulse 79   Temp 97.7 F (36.5 C) (Temporal)   Resp 15   Ht _0  (1.753 m)   Wt 210 lb (95.3 kg)   SpO2 97%   BMI 31.01 kg/m  BMI: Estimated body mass index is 31.01 kg/m as calculated from the following:   Height as of this encounter: _1  (1.753 m).   Weight as of this encounter: 210 lb (95.3 kg). Ideal: Ideal body  weight: 66.2 kg (145 lb 15.1 oz) Adjusted ideal body weight: 77.8 kg (171 lb 9.1 oz)  Right intercostal neuralgia  Assessment   Status Diagnosis  Having a Flare-up Controlled Controlled 1. Intercostal neuralgia   2. Plantar fasciitis of right foot   3. Chronic radicular lumbar pain   4. PTSD (post-traumatic stress disorder)   5. Lumbar post-laminectomy syndrome   6. Chronic pain of both shoulders   7. Chronic pain syndrome       Plan of Care   No problem-specific Assessment & Plan notes found for this encounter.  [name] has a current medication list which includes the following long-term medication(s): bupropion, duloxetine, duloxetine, gabapentin, omeprazole, zolpidem, and enbrel.  Pharmacotherapy (Medications Ordered): Meds ordered this encounter  Medications   orphenadrine (NORFLEX) injection 30 mg   ketorolac (TORADOL) 30 MG/ML injection 30 mg   Orders:  No orders of the defined types were placed in this encounter.  Follow-up plan:   Return for Keep sch. appt.     Left L5-S1 ESI #1,  01/06/2021, 6 cc injected        Recent Visits Date Type Provider Dept  09/15/21 Office Visit Gillis Santa, MD [facility]  07/15/21 Office Visit Gillis Santa, MD [facility]  Showing recent visits within past 90 days and meeting all other requirements Today's Visits Date Type Provider Dept  10/01/21 Procedure visit Gillis Santa, MD [facility]  Showing today's visits and meeting all other requirements Future Appointments Date Type Provider Dept  10/16/21 Appointment Gillis Santa, MD [facility]  12/02/21 Appointment Gillis Santa, MD [facility]  Showing future appointments within next 90 days and meeting all other requirements I discussed the assessment and treatment plan with the patient. The patient was provided an opportunity to ask questions and all were answered. The patient agreed with the plan and  demonstrated an understanding of the instructions.  Patient advised to call back or seek an in-person evaluation if the symptoms or condition worsens.  Duration of encounter: 37mnutes.  Note by: Gillis Santa, MD Date: 10/01/2021; Time: 3:06 PM

## 2021-10-01 NOTE — Progress Notes (Signed)
Safety precautions to be maintained throughout the outpatient stay will include: orient to surroundings, keep bed in low position, maintain call bell within reach at all times, provide assistance with transfer out of bed and ambulation.  

## 2021-10-02 ENCOUNTER — Other Ambulatory Visit: Payer: Self-pay | Admitting: Student in an Organized Health Care Education/Training Program

## 2021-10-02 ENCOUNTER — Telehealth: Payer: Self-pay | Admitting: *Deleted

## 2021-10-02 DIAGNOSIS — M722 Plantar fascial fibromatosis: Secondary | ICD-10-CM

## 2021-10-02 DIAGNOSIS — G588 Other specified mononeuropathies: Secondary | ICD-10-CM

## 2021-10-02 MED ORDER — METHOCARBAMOL 750 MG PO TABS: 750.0000 mg | ORAL_TABLET | Freq: Three times a day (TID) | ORAL | 0 refills | Status: DC | PRN

## 2021-10-02 NOTE — Progress Notes (Signed)
Patient called and informed

## 2021-10-02 NOTE — Telephone Encounter (Signed)
States Toradol/Norflex worked well but "wore off". No issues from injection.
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
## 2021-10-06 ENCOUNTER — Telehealth (INDEPENDENT_AMBULATORY_CARE_PROVIDER_SITE_OTHER): Payer: Medicare PPO | Admitting: Psychiatry

## 2021-10-06 ENCOUNTER — Encounter: Payer: Self-pay | Admitting: Psychiatry

## 2021-10-06 DIAGNOSIS — F4310 Post-traumatic stress disorder, unspecified: Secondary | ICD-10-CM | POA: Diagnosis not present

## 2021-10-06 DIAGNOSIS — F331 Major depressive disorder, recurrent, moderate: Secondary | ICD-10-CM

## 2021-10-06 MED ORDER — BUPROPION HCL ER (XL) 150 MG PO TB24: 450.0000 mg | ORAL_TABLET | Freq: Every day | ORAL | 2 refills | Status: DC

## 2021-10-06 NOTE — Patient Instructions (Signed)
Increase bupropion 450 mg daily Decrease duloxetine 60 mg daily  Next appointment: 1/16 at 10 AM  The next visit will be in person visit. Please arrive 15 mins before the scheduled time.   [facility]  Address: [address], [place], [place] [address]

## 2021-10-07 DIAGNOSIS — Z0000 Encounter for general adult medical examination without abnormal findings: Secondary | ICD-10-CM | POA: Diagnosis not present

## 2021-10-07 DIAGNOSIS — Z23 Encounter for immunization: Secondary | ICD-10-CM | POA: Diagnosis not present

## 2021-10-07 DIAGNOSIS — F339 Major depressive disorder, recurrent, unspecified: Secondary | ICD-10-CM | POA: Diagnosis not present

## 2021-10-14 ENCOUNTER — Encounter: Payer: Medicare PPO | Admitting: Student in an Organized Health Care Education/Training Program

## 2021-10-16 ENCOUNTER — Encounter: Payer: Self-pay | Admitting: Student in an Organized Health Care Education/Training Program

## 2021-10-16 ENCOUNTER — Other Ambulatory Visit: Payer: Self-pay

## 2021-10-16 ENCOUNTER — Ambulatory Visit
Payer: Medicare PPO | Attending: Student in an Organized Health Care Education/Training Program | Admitting: Student in an Organized Health Care Education/Training Program

## 2021-10-16 DIAGNOSIS — G588 Other specified mononeuropathies: Secondary | ICD-10-CM | POA: Diagnosis not present

## 2021-10-16 DIAGNOSIS — G894 Chronic pain syndrome: Secondary | ICD-10-CM | POA: Diagnosis not present

## 2021-10-16 NOTE — Progress Notes (Signed)
Patient: Emily Landry  Service Category: E/M  Provider: Gillis Santa, MD  DOB: August 30, 1951  DOS: 10/16/2021  Location: Office  MRN: [id number]  Setting: Ambulatory outpatient  Referring Provider: Lesleigh Noe, MD  Type: Established Patient  Specialty: Interventional Pain Management  PCP: Lesleigh Noe, MD  Location: Remote location  Delivery: TeleHealth     Virtual Encounter - Pain Management PROVIDER NOTE: Information contained herein reflects review and annotations entered in association with encounter. Interpretation of such information and data should be left to medically-trained personnel. Information provided to patient can be located elsewhere in the medical record under "Patient Instructions". Document created using STT-dictation technology, any transcriptional errors that may result from process are unintentional.    Contact & Pharmacy Preferred: [phone] Home: [phone] (home) Mobile: [phone] (mobile) E-mail: [email or web address]  [facility] AT [address] [place] [address] Phone: [phone] Fax: [phone]   Pre-screening  Emily Landry offered "in-person" vs "virtual" encounter. She indicated preferring virtual for this encounter.   Reason COVID-19*  Social distancing based on CDC and AMA recommendations.   I contacted Emily Landry on 10/16/2021 via telephone.      I clearly identified myself as Gillis Santa, MD. I verified that I was speaking with the correct person using two identifiers (Name: Emily Landry, and date of birth: 02-Nov-1951).  Consent I sought verbal advanced consent from Emily Landry for virtual visit interactions. I informed Emily Landry of possible security and privacy concerns, risks, and limitations associated with providing "not-in-person" medical evaluation and management services. I also informed Emily Landry of the availability of "in-person"  appointments. Finally, I informed her that there would be a charge for the virtual visit and that she could be  personally, fully or partially, financially responsible for it. Emily Landry expressed understanding and agreed to proceed.   Historic Elements   Emily Landry is a 70 y.o. year old, female patient evaluated today after our last contact on 10/01/2021. Emily Landry  has a past medical history of Anxiety, Arthritis, Depression, and GERD (gastroesophageal reflux disease). She also  has a past surgical history that includes Laminectomy (1971); Lumbar disc surgery (1991); Tonsillectomy; Cholecystectomy; and Abdominal hysterectomy. Emily Landry has a current medication list which includes the following prescription(s): bupropion, bupropion, duloxetine, folic acid, gabapentin, methocarbamol, methotrexate, morphine, [START ON 11/14/2021] morphine, omeprazole, oxycodone-acetaminophen, [START ON 11/14/2021] oxycodone-acetaminophen, triamcinolone cream, zolpidem, and enbrel. She  reports that she has never smoked. She has never used smokeless tobacco. She reports current alcohol use. She reports that she does not use drugs. Emily Landry has No Known Allergies.   HPI  Today, she is being contacted for a post-procedure assessment.   Post-Procedure Evaluation  Procedure (10/01/2021):   IM Norflex and Toradol  Anxiolysis: Please see nurses note.  Effectiveness during initial hour after procedure (Ultra-Short Term Relief): 100 %   Local anesthetic used: Long-acting (4-6 hours) Effectiveness: Defined as any analgesic benefit obtained secondary to the administration of local anesthetics. This carries significant diagnostic value as to the etiological location, or anatomical origin, of the pain. Duration of benefit is expected to coincide with the duration of the local anesthetic used.  Effectiveness during initial 4-6 hours after procedure (Short-Term Relief): 100 % (toradol/norflex worked the first day  and then pain returned the next day.)   Long-term benefit: Defined as any relief past the pharmacologic duration of the local anesthetics.  Effectiveness past the initial  6 hours after procedure (Long-Term Relief): 0 % (patient states she is so miserable and in so much pain.  States, if she has to live like this there is no sense living.)   Benefits, current: Defined as benefit present at the time of this evaluation.   Analgesia:  0%   Pharmacotherapy Assessment   Analgesic: Morphine 15 mg twice daily, MME equals 30; Percocet 10 mg twice daily as needed for breakthrough pain,; MME equals 30.  Total MME equals 60    Monitoring: [place] PMP: PDMP reviewed during this encounter.       Pharmacotherapy: No side-effects or adverse reactions reported. Compliance: No problems identified. Effectiveness: Clinically acceptable. Plan: Refer to "POC". UDS:  Summary  Date Value Ref Range Status  03/06/2021 Note  Final    Comment:    ==================================================================== [employer] Select 13 (MW) ==================================================================== Test                             Result       Flag       Units  Drug Present and Declared for Prescription Verification   Oxycodone                      1031         EXPECTED   ng/mg creat   Oxymorphone                    760          EXPECTED   ng/mg creat   Noroxycodone                   4697         EXPECTED   ng/mg creat   Noroxymorphone                 277          EXPECTED   ng/mg creat    Sources of oxycodone are scheduled prescription medications.    Oxymorphone, noroxycodone, and noroxymorphone are expected    metabolites of oxycodone. Oxymorphone is also available as a    scheduled prescription medication.  Drug Present not Declared for Prescription Verification   Morphine                       10231        UNEXPECTED ng/mg creat   Normorphine                    549          UNEXPECTED ng/mg  creat    Potential sources of large amounts of morphine in the absence of    codeine include administration of morphine or use of heroin.     Normorphine is an expected metabolite of morphine.  ==================================================================== Test                      Result    Flag   Units      Ref Range   Creatinine              35               mg/dL      >=20 ==================================================================== Declared Medications:  The flagging and interpretation on this report are based on the  following declared medications.  Unexpected results may arise from  inaccuracies in the declared medications.   **Note: The testing scope of this panel includes these medications:   Oxycodone (Percocet)   **Note: The testing scope of this panel does not include the  following reported medications:   Acetaminophen (Percocet)  Buspirone (Buspar)  Duloxetine (Cymbalta)  Folic Acid  Gabapentin (Neurontin)  Hydroxychloroquine  Hydroxyzine (Atarax)  Methotrexate  Omeprazole (Prilosec)  Prednisone (Deltasone)  Triamcinolone (Kenalog)  Zolpidem (Ambien) ==================================================================== For clinical consultation, please call [phone]. ====================================================================      Laboratory Chemistry Profile   Renal Lab Results  Component Value Date   BUN 11 05/21/2020   CREATININE 0.6 05/21/2020    Hepatic Lab Results  Component Value Date   AST 31 05/21/2020   ALT 22 05/21/2020   ALBUMIN 4.2 05/21/2020   ALKPHOS 71 05/21/2020    Electrolytes Lab Results  Component Value Date   NA 142 05/21/2020   K 4.9 05/21/2020   CL 104 05/21/2020   CALCIUM 9.1 05/21/2020    Bone No results found for: VD25OH, VD125OH2TOT, ML4650PT4, SF6812XN1, 25OHVITD1, 25OHVITD2, 25OHVITD3, TESTOFREE, TESTOSTERONE  Inflammation (CRP: Acute Phase) (ESR: Chronic Phase) Lab Results   Component Value Date   ESRSEDRATE 6 12/06/2019         Note: Above Lab results reviewed.    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 A diagnosis of Chronic pain syndrome was also pertinent to this visit.  Plan of Care   1. 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  She received intramuscular Norflex Toradol injection on October 19 for acute pain flare which was not very helpful.  She states that she is still experiencing significant pain in that region that wraps underneath her rib cage extends from her thoracic spine anteriorly.  We have discussed a right intercostal nerve block in the past and patient would like to try this now as her pain is worsening and she is having more difficulty with ADLs.  Risks and benefits discussed of right intercostal nerve block.  Also discussed a pulsed radiofrequency ablation depending upon results from her intercostal nerve block.  Orders Placed This Encounter  Procedures   INTERCOSTAL NERVE BLOCK    Standing Status:   Future    Standing Expiration Date:   12/16/2021    Scheduling Instructions:     RIGHT with PO Valium      Timeframe: ASAA    Order Specific Question:   Where will this procedure be performed?    Answer:   [facility]       Follow-up plan:   Return in about 11 days (around 10/27/2021) for R ICNB , minimal sedation (PO Valium).     Left L5-S1 ESI #1,  01/06/2021, 6 cc injected         Recent Visits Date Type Provider Dept  10/01/21 Procedure visit Gillis Santa, MD [facility]  09/15/21 Office Visit Gillis Santa, MD [facility]  Showing recent visits within past 90 days and meeting all other requirements Today's Visits Date Type Provider Dept  10/16/21 Office Visit Gillis Santa, MD [facility]  Showing today's visits and meeting all other requirements Future  Appointments Date Type Provider Dept  12/02/21 Appointment Gillis Santa, MD [facility]  Showing future appointments within next 90 days and meeting all other requirements I discussed the assessment and treatment plan with the patient. The patient was provided an opportunity to ask questions and  all were answered. The patient agreed with the plan and demonstrated an understanding of the instructions.  Patient advised to call back or seek an in-person evaluation if the symptoms or condition worsens.  Duration of encounter: 81mnutes.  Note by: BGillis Santa MD Date: 10/16/2021; Time: 2:47 PM

## 2021-10-17 ENCOUNTER — Ambulatory Visit (INDEPENDENT_AMBULATORY_CARE_PROVIDER_SITE_OTHER): Payer: Medicare PPO | Admitting: Licensed Clinical Social Worker

## 2021-10-17 DIAGNOSIS — F4310 Post-traumatic stress disorder, unspecified: Secondary | ICD-10-CM | POA: Diagnosis not present

## 2021-10-17 DIAGNOSIS — F331 Major depressive disorder, recurrent, moderate: Secondary | ICD-10-CM | POA: Diagnosis not present

## 2021-10-17 NOTE — Progress Notes (Addendum)
Virtual Visit via Audio Note  I connected with Emily Landry on 10/17/21 at  9:00 AM EDT by an audio enabled telemedicine application and verified that I am speaking with the correct person using two identifiers.  Location: Patient: home Provider: remote office [place], [place])   I discussed the limitations of evaluation and management by telemedicine and the availability of in person appointments. The patient expressed understanding and agreed to proceed.  I discussed the assessment and treatment plan with the patient. The patient was provided an opportunity to ask questions and all were answered. The patient agreed with the plan and demonstrated an understanding of the instructions.   The patient was advised to call back or seek an in-person evaluation if the symptoms worsen or if the condition fails to improve as anticipated.  I provided 40 minutes of non-face-to-face time during this encounter.   Nasiir Monts R Absalom Aro, LCSW   THERAPIST PROGRESS NOTE  Session Time: [phone]  Participation Level: Active  Behavioral Response: NeatAlertAnxious and Depressed  Type of Therapy: Individual Therapy  Treatment Goals addressed:  Problem: Decrease depressive symptoms and improve levels of effective functioning Goal: LTG: Reduce frequency, intensity, and duration of depression symptoms as evidenced by: pt self report Outcome: Progressing Goal: STG: Emily Landry WILL PARTICIPATE IN AT LEAST 80% OF SCHEDULED INDIVIDUAL PSYCHOTHERAPY SESSIONS Outcome: Progressing Problem: Reduce overall frequency, intensity, and duration of the anxiety so that daily functioning is not impaired. Goal: LTG: Patient will score less than 5 on the Generalized Anxiety Disorder 7 Scale (GAD-7) Outcome: Progressing Goal: STG: Patient will participate in at least 80% of scheduled individual psychotherapy sessions Outcome: Progressing  Interventions:  Intervention: WORK WITH Emily Landry TO IDENTIFY THE MAJOR COMPONENTS OF  A RECENT EPISODE OF DEPRESSION: PHYSICAL SYMPTOMS, MAJOR THOUGHTS AND IMAGES, AND MAJOR BEHAVIORS THEY EXPERIENCED Intervention: REVIEW PLEASE SKILLS (TREAT PHYSICAL ILLNESS, BALANCE EATING, AVOID MOOD-ALTERING SUBSTANCES, BALANCE SLEEP AND GET EXERCISE) WITH Emily Landry Intervention: Perform psychoeducation regarding anxiety disorders Intervention: Perform motivational interviewing regarding use of tools Summary: Emily Landry is a 70 y.o. female who presents with stress associated with health-related concerns. PT reports that she is still having back-related pain and is unclear as to why. Pt reports that overall mood is fluctuating "good days and bad days".  Reviewed coping skills to help on bad days and for pt to recognize why "good days" are going well and try to duplicate activities or scenarios that are positively influencing days/relationships. Pt is continuing to have caregiver stress associated with her husband's care. Reminded pt about the benefit of self care.  Continued recommendations are as follows: self care behaviors, positive social engagements, focusing on overall work/home/life balance, and focusing on positive physical and emotional wellness.    Suicidal/Homicidal: No  Therapist Response: Pt is continuing to apply interventions learned in session into daily life situations. Pt is currently on track to meet goals utilizing interventions mentioned above. Personal growth and progress noted. Treatment to continue as indicated.   Plan: Return again in 4 weeks.  Diagnosis: Axis I: PTSD    Axis II: No diagnosis    Emily Landry Emily Keetch, LCSW 10/17/2021

## 2021-10-20 NOTE — Plan of Care (Signed)
  Problem: Decrease depressive symptoms and improve levels of effective functioning Goal: LTG: Reduce frequency, intensity, and duration of depression symptoms as evidenced by: pt self report Outcome: Progressing Goal: STG: [place] WILL PARTICIPATE IN AT LEAST 80% OF SCHEDULED INDIVIDUAL PSYCHOTHERAPY SESSIONS Outcome: Progressing Intervention: WORK WITH Digna TO IDENTIFY THE MAJOR COMPONENTS OF A RECENT EPISODE OF DEPRESSION: PHYSICAL SYMPTOMS, MAJOR THOUGHTS AND IMAGES, AND MAJOR BEHAVIORS THEY EXPERIENCED Intervention: REVIEW PLEASE SKILLS (TREAT PHYSICAL ILLNESS, BALANCE EATING, AVOID MOOD-ALTERING SUBSTANCES, BALANCE SLEEP AND GET EXERCISE) WITH Emmer   Problem: Reduce overall frequency, intensity, and duration of the anxiety so that daily functioning is not impaired. Goal: LTG: Patient will score less than 5 on the Generalized Anxiety Disorder 7 Scale (GAD-7) Outcome: Progressing Goal: STG: Patient will participate in at least 80% of scheduled individual psychotherapy sessions Outcome: Progressing Intervention: Perform psychoeducation regarding anxiety disorders Intervention: Perform motivational interviewing regarding use of tools

## 2021-10-28 DIAGNOSIS — H903 Sensorineural hearing loss, bilateral: Secondary | ICD-10-CM | POA: Diagnosis not present

## 2021-10-28 DIAGNOSIS — R682 Dry mouth, unspecified: Secondary | ICD-10-CM | POA: Diagnosis not present

## 2021-10-29 ENCOUNTER — Other Ambulatory Visit: Payer: Self-pay | Admitting: Otolaryngology

## 2021-10-29 DIAGNOSIS — E041 Nontoxic single thyroid nodule: Secondary | ICD-10-CM

## 2021-10-30 ENCOUNTER — Ambulatory Visit
Admission: RE | Admit: 2021-10-30 | Discharge: 2021-10-30 | Disposition: A | Discharge status: Home / Self Care | Payer: Medicare PPO | Source: Ambulatory Visit | Attending: Otolaryngology | Admitting: Otolaryngology

## 2021-10-30 ENCOUNTER — Encounter (INDEPENDENT_AMBULATORY_CARE_PROVIDER_SITE_OTHER): Payer: Self-pay

## 2021-10-30 ENCOUNTER — Other Ambulatory Visit: Payer: Self-pay

## 2021-10-30 DIAGNOSIS — E041 Nontoxic single thyroid nodule: Secondary | ICD-10-CM

## 2021-11-03 ENCOUNTER — Other Ambulatory Visit: Payer: Self-pay | Admitting: Family Medicine

## 2021-11-03 ENCOUNTER — Other Ambulatory Visit: Payer: Self-pay | Admitting: Student in an Organized Health Care Education/Training Program

## 2021-11-03 DIAGNOSIS — G4700 Insomnia, unspecified: Secondary | ICD-10-CM

## 2021-11-13 ENCOUNTER — Telehealth: Payer: Self-pay

## 2021-11-13 NOTE — Telephone Encounter (Signed)
She had an order for a lesi in august but the auth expired because she wasn't ready to schedule. Can you put in another order for Lesi, she is now having back pain and would like to proceed with procedure. I will have to start over on the authorization process.

## 2021-11-13 NOTE — Telephone Encounter (Signed)
Actually the order is still good. Never mind. Thanks

## 2021-11-21 DIAGNOSIS — H52203 Unspecified astigmatism, bilateral: Secondary | ICD-10-CM | POA: Diagnosis not present

## 2021-11-21 DIAGNOSIS — H25013 Cortical age-related cataract, bilateral: Secondary | ICD-10-CM | POA: Diagnosis not present

## 2021-11-21 DIAGNOSIS — H2513 Age-related nuclear cataract, bilateral: Secondary | ICD-10-CM | POA: Diagnosis not present

## 2021-11-21 DIAGNOSIS — H04123 Dry eye syndrome of bilateral lacrimal glands: Secondary | ICD-10-CM | POA: Diagnosis not present

## 2021-11-24 ENCOUNTER — Encounter: Payer: Self-pay | Admitting: Student in an Organized Health Care Education/Training Program

## 2021-11-24 ENCOUNTER — Other Ambulatory Visit: Payer: Self-pay

## 2021-11-24 ENCOUNTER — Ambulatory Visit (INDEPENDENT_AMBULATORY_CARE_PROVIDER_SITE_OTHER): Payer: Medicare PPO | Admitting: Licensed Clinical Social Worker

## 2021-11-24 ENCOUNTER — Ambulatory Visit
Payer: Medicare PPO | Attending: Student in an Organized Health Care Education/Training Program | Admitting: Student in an Organized Health Care Education/Training Program

## 2021-11-24 DIAGNOSIS — F4310 Post-traumatic stress disorder, unspecified: Secondary | ICD-10-CM | POA: Diagnosis not present

## 2021-11-24 DIAGNOSIS — G894 Chronic pain syndrome: Secondary | ICD-10-CM

## 2021-11-24 DIAGNOSIS — G588 Other specified mononeuropathies: Secondary | ICD-10-CM

## 2021-11-24 NOTE — Progress Notes (Signed)
Virtual Visit via Audio Note  I connected with Emily Landry on 11/24/21 at 11:00 AM EST by an audio enabled telemedicine application and verified that I am speaking with the correct person using two identifiers.  Location: Patient: home Provider: remote office [place], [place])   I discussed the limitations of evaluation and management by telemedicine and the availability of in person appointments. The patient expressed understanding and agreed to proceed.  I discussed the assessment and treatment plan with the patient. The patient was provided an opportunity to ask questions and all were answered. The patient agreed with the plan and demonstrated an understanding of the instructions.   The patient was advised to call back or seek an in-person evaluation if the symptoms worsen or if the condition fails to improve as anticipated.  I provided 40 minutes of non-face-to-face time during this encounter.   Emily Libman R [name], LCSW   THERAPIST PROGRESS NOTE  Session Time: [phone]  Participation Level: Active  Behavioral Response: NeatAlertAnxious and Depressed  Type of Therapy: Individual Therapy  Treatment Goals addressed:  Problem: Decrease depressive symptoms and improve levels of effective functioning  Goal: LTG: Reduce frequency, intensity, and duration of depression symptoms as evidenced by: pt self report Outcome: Progressing  Goal: STG: Emily Landry WILL PARTICIPATE IN AT LEAST 80% OF SCHEDULED INDIVIDUAL PSYCHOTHERAPY SESSIONS Outcome: Progressing  Problem: Reduce overall frequency, intensity, and duration of the anxiety so that daily functioning is not impaired.  Goal: LTG: Patient will score less than 5 on the Generalized Anxiety Disorder 7 Scale (GAD-7) Outcome: Progressing  Goal: STG: Patient will participate in at least 80% of scheduled individual psychotherapy sessions Outcome: Progressing  Interventions:   Intervention: REVIEW PLEASE SKILLS (TREAT PHYSICAL  ILLNESS, BALANCE EATING, AVOID MOOD-ALTERING SUBSTANCES, BALANCE SLEEP AND GET EXERCISE) WITH Emily Landry  Intervention: Perform psychoeducation regarding anxiety disorders  Intervention: Perform motivational interviewing regarding use of tools  Summary: Emily Landry is a 70 y.o. female who presents with stress associated with health-related concerns. PT reports that she is still having back-related pain and is unclear as to why.  Pt is continuing to have caregiver stress associated with her husband's care. Reminded pt about the benefit of self care.  Discussed pt coping with chronic pain. Discussed treatments that have been helpful with pain doctor an chiropractor. Pt is trying hard to stay active by walking pet dog daily. Pt admits that the more she walks, the better she feels. Praised pts motivation and encouraged her to continue.   Explored financial constraints and decisions made many years ago about planning for the future. Discussed pts role as head of the household.  Continued recommendations are as follows: self care behaviors, positive social engagements, focusing on overall work/home/life balance, and focusing on positive physical and emotional wellness.   Suicidal/Homicidal: No  Therapist Response: Pt is continuing to apply interventions learned in session into daily life situations. Pt is currently on track to meet goals utilizing interventions mentioned above. Personal growth and progress noted. Treatment to continue as indicated.   Plan: Return again in 4 weeks.  Diagnosis: Axis I: PTSD    Axis II: No diagnosis    [name] Nahom Carfagno, LCSW 11/24/2021

## 2021-11-24 NOTE — Progress Notes (Signed)
Patient: Emily Landry  Service Category: E/M  Provider: Gillis Santa, MD  DOB: 09-Dec-1951  DOS: 11/24/2021  Location: Office  MRN: [id number]  Setting: Ambulatory outpatient  Referring Provider: Derinda Late, MD  Type: Established Patient  Specialty: Interventional Pain Management  PCP: Derinda Late, MD  Location: Remote location  Delivery: TeleHealth     Virtual Encounter - Pain Management PROVIDER NOTE: Information contained herein reflects review and annotations entered in association with encounter. Interpretation of such information and data should be left to medically-trained personnel. Information provided to patient can be located elsewhere in the medical record under "Patient Instructions". Document created using STT-dictation technology, any transcriptional errors that may result from process are unintentional.    Contact & Pharmacy Preferred: [phone] Home: [phone] (home) Mobile: [phone] (mobile) E-mail: [email or web address]  [facility] [id number] [place], [address] AT [address] [place] [address] Phone: [phone] Fax: [phone]   Pre-screening  Emily Landry offered "in-person" vs "virtual" encounter. She indicated preferring virtual for this encounter.   Reason COVID-19*  Social distancing based on CDC and AMA recommendations.   I contacted Emily Landry on 11/24/2021 via telephone.      I clearly identified myself as Gillis Santa, MD. I verified that I was speaking with the correct person using two identifiers (Name: Emily Landry, and date of birth: 1951/05/29).  Consent I sought verbal advanced consent from Emily Landry for virtual visit interactions. I informed Emily Landry of possible security and privacy concerns, risks, and limitations associated with providing "not-in-person" medical evaluation and management services. I also informed Emily Landry of the availability of "in-person"  appointments. Finally, I informed her that there would be a charge for the virtual visit and that she could be  personally, fully or partially, financially responsible for it. Emily Landry expressed understanding and agreed to proceed.   Historic Elements   Emily Landry is a 70 y.o. year old, female patient evaluated today after our last contact on 11/03/2021. Emily Landry  has a past medical history of Anxiety, Arthritis, Depression, and GERD (gastroesophageal reflux disease). She also  has a past surgical history that includes Laminectomy (1971); Lumbar disc surgery (1991); Tonsillectomy; Cholecystectomy; and Abdominal hysterectomy. Emily Landry has a current medication list which includes the following prescription(s): bupropion, duloxetine, enbrel, folic acid, gabapentin, methocarbamol, methotrexate, morphine, omeprazole, oxycodone-acetaminophen, triamcinolone cream, bupropion, and zolpidem. She  reports that she has never smoked. She has never used smokeless tobacco. She reports current alcohol use. She reports that she does not use drugs. Emily Landry has No Known Allergies.   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  It is affecting both sides her right side and the left side.  We have discussed intercostal nerve blocks in the past.  She states the pain that wraps underneath her rib cage is debilitating.  It is affecting her ADLs.  Patient would like to move forward with bilateral diagnostic intercostal nerve block under fluoroscopy.  Pharmacotherapy Assessment   Analgesic: Morphine 15 mg twice daily, MME equals 30; Percocet 10 mg twice daily as needed for breakthrough pain,; MME equals 30.  Total MME equals 60    Monitoring: [place] PMP: PDMP not reviewed this encounter.       Pharmacotherapy: No side-effects or adverse reactions  reported. Compliance: No problems identified. Effectiveness: Clinically acceptable. Plan:  Refer to "POC". UDS:  Summary  Date Value Ref Range Status  03/06/2021 Note  Final    Comment:    ==================================================================== [employer] Select 13 (MW) ==================================================================== Test                             Result       Flag       Units  Drug Present and Declared for Prescription Verification   Oxycodone                      1031         EXPECTED   ng/mg creat   Oxymorphone                    760          EXPECTED   ng/mg creat   Noroxycodone                   4697         EXPECTED   ng/mg creat   Noroxymorphone                 277          EXPECTED   ng/mg creat    Sources of oxycodone are scheduled prescription medications.    Oxymorphone, noroxycodone, and noroxymorphone are expected    metabolites of oxycodone. Oxymorphone is also available as a    scheduled prescription medication.  Drug Present not Declared for Prescription Verification   Morphine                       10231        UNEXPECTED ng/mg creat   Normorphine                    549          UNEXPECTED ng/mg creat    Potential sources of large amounts of morphine in the absence of    codeine include administration of morphine or use of heroin.     Normorphine is an expected metabolite of morphine.  ==================================================================== Test                      Result    Flag   Units      Ref Range   Creatinine              35               mg/dL      >=20 ==================================================================== Declared Medications:  The flagging and interpretation on this report are based on the  following declared medications.  Unexpected results may arise from  inaccuracies in the declared medications.   **Note: The testing scope of this panel includes these medications:   Oxycodone  (Percocet)   **Note: The testing scope of this panel does not include the  following reported medications:   Acetaminophen (Percocet)  Buspirone (Buspar)  Duloxetine (Cymbalta)  Folic Acid  Gabapentin (Neurontin)  Hydroxychloroquine  Hydroxyzine (Atarax)  Methotrexate  Omeprazole (Prilosec)  Prednisone (Deltasone)  Triamcinolone (Kenalog)  Zolpidem (Ambien) ==================================================================== For clinical consultation, please call [phone]. ====================================================================      Laboratory Chemistry Profile   Renal Lab Results  Component Value Date   BUN 11 05/21/2020   CREATININE 0.6 05/21/2020    Hepatic Lab Results  Component  Value Date   AST 31 05/21/2020   ALT 22 05/21/2020   ALBUMIN 4.2 05/21/2020   ALKPHOS 71 05/21/2020    Electrolytes Lab Results  Component Value Date   NA 142 05/21/2020   K 4.9 05/21/2020   CL 104 05/21/2020   CALCIUM 9.1 05/21/2020    Bone No results found for: VD25OH, VD125OH2TOT, KX3818EX9, BZ1696VE9, 25OHVITD1, 25OHVITD2, 25OHVITD3, TESTOFREE, TESTOSTERONE  Inflammation (CRP: Acute Phase) (ESR: Chronic Phase) Lab Results  Component Value Date   ESRSEDRATE 6 12/06/2019         Note: Above Lab results reviewed.  Imaging  US THYROID CLINICAL DATA:  Thyroid nodule  EXAM: THYROID ULTRASOUND  TECHNIQUE: Ultrasound examination of the thyroid gland and adjacent soft tissues was performed.  COMPARISON:  None.  FINDINGS: Parenchymal Echotexture: Mildly heterogenous  Isthmus: 0.2 cm  Right lobe: 5.3 x 1.4 x 1.9 cm  Left lobe: 5.2 x 1.2 x 1.4 cm  _________________________________________________________  Estimated total number of nodules >/= 1 cm: 0  Number of spongiform nodules >/=  2 cm not described below (TR1): 0  Number of mixed cystic and solid nodules >/= 1.5 cm not described below (TR2):  0  _________________________________________________________  Nodule labeled refers to a subcentimeter solid isoechoic nodule in the posterior aspect of the right thyroid lobe (TR 3). Given size (<1.4 cm) and appearance, this nodule does NOT meet TI-RADS criteria for biopsy or dedicated follow-up.  There is an additional subcentimeter hypoechoic oval structure which appears just deep to the posterior right thyroid lobe, compatible with the appearance of a normal parathyroid gland.  IMPRESSION: 1. The thyroid gland measures at the upper limit of normal. No suspicious thyroid nodules.  The above is in keeping with the ACR TI-RADS recommendations - J Am Coll Radiol 2017;14:587-595.  Electronically Signed   By: Albin Felling M.D.   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 A diagnosis of Chronic pain syndrome was also pertinent to this visit.  Plan of Care   Orders:  Orders Placed This Encounter  Procedures   INTERCOSTAL NERVE BLOCK    Standing Status:   Future    Standing Expiration Date:   01/25/2022    Scheduling Instructions:     Side: Bilateral     Sedation: PO Valium     Timeframe: ASAA    Order Specific Question:   Where will this procedure be performed?    Answer:   [facility]    Follow-up plan:   Return in about 9 days (around 12/03/2021) for B/L ICNB , minimal sedation (PO Valium) (40 mins).     Left L5-S1 ESI #1,  01/06/2021, 6 cc injected          Recent Visits Date Type Provider Dept  10/16/21 Office Visit Gillis Santa, MD [facility]  10/01/21 Procedure visit Gillis Santa, MD [facility]  09/15/21 Office Visit Gillis Santa, MD [facility]  Showing recent visits within past 90 days and meeting all other requirements Today's Visits Date Type Provider Dept  11/24/21 Office Visit Gillis Santa, MD [facility]  Showing today's visits and meeting all  other requirements Future Appointments Date Type Provider Dept  12/02/21 Appointment Gillis Santa, MD [facility]  Showing future appointments within next 90 days and meeting all other requirements I discussed the assessment and treatment plan with the patient. The patient was provided an opportunity to ask questions and all were answered. The  patient agreed with the plan and demonstrated an understanding of the instructions.  Patient advised to call back or seek an in-person evaluation if the symptoms or condition worsens.  Duration of encounter: 68mnutes.  Note by: BGillis Santa MD Date: 11/24/2021; Time: 3:28 PM

## 2021-12-02 ENCOUNTER — Encounter: Payer: Medicare PPO | Admitting: Student in an Organized Health Care Education/Training Program

## 2021-12-03 ENCOUNTER — Ambulatory Visit: Payer: Medicare PPO | Admitting: Student in an Organized Health Care Education/Training Program

## 2021-12-03 ENCOUNTER — Other Ambulatory Visit: Payer: Self-pay

## 2021-12-03 ENCOUNTER — Other Ambulatory Visit: Payer: Self-pay | Admitting: Family Medicine

## 2021-12-03 ENCOUNTER — Ambulatory Visit
Admission: RE | Admit: 2021-12-03 | Discharge: 2021-12-03 | Disposition: A | Discharge status: Home / Self Care | Payer: Medicare PPO | Source: Ambulatory Visit | Attending: Student in an Organized Health Care Education/Training Program | Admitting: Student in an Organized Health Care Education/Training Program

## 2021-12-03 ENCOUNTER — Encounter: Payer: Self-pay | Admitting: Student in an Organized Health Care Education/Training Program

## 2021-12-03 VITALS — BP 172/104 | HR 87 | Temp 96.9°F | Resp 14 | Ht 69.5 in | Wt 210.0 lb

## 2021-12-03 DIAGNOSIS — G894 Chronic pain syndrome: Secondary | ICD-10-CM | POA: Insufficient documentation

## 2021-12-03 DIAGNOSIS — G4700 Insomnia, unspecified: Secondary | ICD-10-CM

## 2021-12-03 DIAGNOSIS — M5416 Radiculopathy, lumbar region: Secondary | ICD-10-CM

## 2021-12-03 DIAGNOSIS — M961 Postlaminectomy syndrome, not elsewhere classified: Secondary | ICD-10-CM

## 2021-12-03 DIAGNOSIS — G588 Other specified mononeuropathies: Secondary | ICD-10-CM | POA: Insufficient documentation

## 2021-12-03 DIAGNOSIS — G8929 Other chronic pain: Secondary | ICD-10-CM

## 2021-12-03 MED ORDER — DEXAMETHASONE SODIUM PHOSPHATE 10 MG/ML IJ SOLN
10.0000 mg | Freq: Once | INTRAMUSCULAR | Status: AC
  Administered 2021-12-03: 10:00:00 10 mg
  Filled 2021-12-03: qty 1

## 2021-12-03 MED ORDER — DIAZEPAM 5 MG PO TABS
ORAL_TABLET | ORAL | Status: AC
  Filled 2021-12-03: qty 1

## 2021-12-03 MED ORDER — LIDOCAINE HCL 2 % IJ SOLN
20.0000 mL | Freq: Once | INTRAMUSCULAR | Status: AC
  Administered 2021-12-03: 10:00:00 100 mg
  Filled 2021-12-03: qty 10

## 2021-12-03 MED ORDER — ROPIVACAINE HCL 2 MG/ML IJ SOLN
INTRAMUSCULAR | Status: AC
  Filled 2021-12-03: qty 20

## 2021-12-03 MED ORDER — OXYCODONE-ACETAMINOPHEN 10-325 MG PO TABS: 1.0000 | ORAL_TABLET | Freq: Two times a day (BID) | ORAL | 0 refills | Status: AC | PRN

## 2021-12-03 MED ORDER — IOHEXOL 180 MG/ML  SOLN
10.0000 mL | Freq: Once | INTRAMUSCULAR | Status: AC
  Administered 2021-12-03: 10:00:00 5 mL via EPIDURAL
  Filled 2021-12-03: qty 10

## 2021-12-03 MED ORDER — MORPHINE SULFATE ER 15 MG PO TBCR: 15.0000 mg | EXTENDED_RELEASE_TABLET | Freq: Two times a day (BID) | ORAL | 0 refills | Status: DC

## 2021-12-03 MED ORDER — DIAZEPAM 5 MG PO TABS
5.0000 mg | ORAL_TABLET | ORAL | Status: AC
  Administered 2021-12-03: 10:00:00 5 mg via ORAL

## 2021-12-03 MED ORDER — ROPIVACAINE HCL 2 MG/ML IJ SOLN
9.0000 mL | Freq: Once | INTRAMUSCULAR | Status: AC
  Administered 2021-12-03: 10:00:00 20 mL via PERINEURAL

## 2021-12-03 NOTE — Progress Notes (Signed)
Safety precautions to be maintained throughout the outpatient stay will include: orient to surroundings, keep bed in low position, maintain call bell within reach at all times, provide assistance with transfer out of bed and ambulation.  

## 2021-12-03 NOTE — Patient Instructions (Signed)

## 2021-12-03 NOTE — Progress Notes (Signed)
PROVIDER NOTE: Information contained herein reflects review and annotations entered in association with encounter. Interpretation of such information and data should be left to medically-trained personnel. Information provided to patient can be located elsewhere in the medical record under "Patient Instructions". Document created using STT-dictation technology, any transcriptional errors that may result from process are unintentional.    Patient: Emily Landry  Service Category: Procedure Provider: Edward Jolly, MD DOB: January 16, 1951 DOS: 12/03/2021 Location: [facility] MRN: [id number] Setting: Ambulatory - outpatient Referring Provider: Edward Jolly, MD Type: Established Patient Specialty: Interventional Pain Management PCP: Kandyce Rud, MD  Primary Reason for Visit: Interventional Pain Management Treatment. CC: Pain (Bilateral intercostal)    Procedure:          Anesthesia, Analgesia, Anxiolysis:  Type: Diagnostic Posterolateral Intercostal  Nerve Block           Region: Posterolateral  Thoracic Area Level: T11, T12 Ribs Laterality:  Bilateral  Anesthesia: Local (1-2% Lidocaine)  Anxiolysis: Oral Valium 5 mg PO  Sedation: None  Guidance: Fluoroscopy           Position: Prone   Indications: 1. Chronic pain syndrome   2. Intercostal neuralgia   3. Chronic radicular lumbar pain   4. Lumbar radiculopathy   5. Failed back surgical syndrome    Pain Score: Pre-procedure: 6 /10 Post-procedure: 6 /10   Emily Landry has significant radiating intercostal pain.  History shingles in the lower thoracic region.  Plan for diagnostic bilateral T11, T12 intercostal nerve block under fluoroscopy. Med refill as below    Pre-op H&P Assessment:  Emily Landry is a 70 y.o. (year old), female patient, seen today for interventional treatment. She  has a past surgical history that includes Laminectomy (1971); Lumbar disc surgery (1991); Tonsillectomy; Cholecystectomy; and Abdominal  hysterectomy. Emily Landry has a current medication list which includes the following prescription(s): bupropion, bupropion, duloxetine, folic acid, gabapentin, methocarbamol, methotrexate, omeprazole, triamcinolone cream, enbrel, [START ON 12/14/2021] morphine, [START ON 12/14/2021] oxycodone-acetaminophen, and zolpidem. Her primarily concern today is the Pain (Bilateral intercostal)  Initial Vital Signs:  Pulse/HCG Rate: 87ECG Heart Rate: 78 Temp: (!) 96.9 F (36.1 C) Resp: 18 BP: (!) 174/81 SpO2: 100 %  BMI: Estimated body mass index is 30.57 kg/m as calculated from the following:   Height as of this encounter: 5' 9.5" (1.765 m).   Weight as of this encounter: 210 lb (95.3 kg).  Risk Assessment: Allergies: Reviewed. She has No Known Allergies.  Allergy Precautions: None required Coagulopathies: Reviewed. None identified.  Blood-thinner therapy: None at this time Active Infection(s): Reviewed. None identified. Emily Landry is afebrile  Site Confirmation: Emily Landry was asked to confirm the procedure and laterality before marking the site Procedure checklist: Completed Consent: Before the procedure and under the influence of no sedative(s), amnesic(s), or anxiolytics, the patient was informed of the treatment options, risks and possible complications. To fulfill our ethical and legal obligations, as recommended by the American Medical Association's Code of Ethics, I have informed the patient of my clinical impression; the nature and purpose of the treatment or procedure; the risks, benefits, and possible complications of the intervention; the alternatives, including doing nothing; the risk(s) and benefit(s) of the alternative treatment(s) or procedure(s); and the risk(s) and benefit(s) of doing nothing. The patient was provided information about the general risks and possible complications associated with the procedure. These may include, but are not limited to: failure to achieve desired goals,  infection, bleeding, organ or nerve damage, allergic reactions, paralysis, and death.  In addition, the patient was informed of those risks and complications associated to the procedure, such as failure to decrease pain; infection; bleeding; organ or nerve damage with subsequent damage to sensory, motor, and/or autonomic systems, resulting in permanent pain, numbness, and/or weakness of one or several areas of the body; allergic reactions; (i.e.: anaphylactic reaction); and/or death. Furthermore, the patient was informed of those risks and complications associated with the medications. These include, but are not limited to: allergic reactions (i.e.: anaphylactic or anaphylactoid reaction(s)); adrenal axis suppression; blood sugar elevation that in diabetics may result in ketoacidosis or comma; water retention that in patients with history of congestive heart failure may result in shortness of breath, pulmonary edema, and decompensation with resultant heart failure; weight gain; swelling or edema; medication-induced neural toxicity; particulate matter embolism and blood vessel occlusion with resultant organ, and/or nervous system infarction; and/or aseptic necrosis of one or more joints. Finally, the patient was informed that Medicine is not an exact science; therefore, there is also the possibility of unforeseen or unpredictable risks and/or possible complications that may result in a catastrophic outcome. The patient indicated having understood very clearly. We have given the patient no guarantees and we have made no promises. Enough time was given to the patient to ask questions, all of which were answered to the patient's satisfaction. Emily Landry has indicated that she wanted to continue with the procedure. Attestation: I, the ordering provider, attest that I have discussed with the patient the benefits, risks, side-effects, alternatives, likelihood of achieving goals, and potential problems during recovery  for the procedure that I have provided informed consent. Date   Time: 12/03/2021  9:34 AM  Pre-Procedure Preparation:  Monitoring: As per clinic protocol. Respiration, ETCO2, SpO2, BP, heart rate and rhythm monitor placed and checked for adequate function Safety Precautions: Patient was assessed for positional comfort and pressure points before starting the procedure. Time-out: I initiated and conducted the "Time-out" before starting the procedure, as per protocol. The patient was asked to participate by confirming the accuracy of the "Time Out" information. Verification of the correct person, site, and procedure were performed and confirmed by me, the nursing staff, and the patient. "Time-out" conducted as per Joint Commission's Universal Protocol (UP.01.01.01). Time: 1015  Description of Procedure:          Target Area: The sub-costal neurovascular bundle area Approach: Sub-costal approach Area Prepped: Entire Posterolateral  Thoracic Region DuraPrep (Iodine Povacrylex [0.7% available iodine] and Isopropyl Alcohol, 74% w/w) Safety Precautions: Aspiration looking for blood return was conducted prior to all injections. At no point did we inject any substances, as a needle was being advanced. No attempts were made at seeking any paresthesias. Safe injection practices and needle disposal techniques used. Medications properly checked for expiration dates. SDV (single dose vial) medications used. Description of the Procedure: Protocol guidelines were followed. The patient was placed in position over the procedure table. The target area was identified and the area prepped in the usual manner. Skin & deeper tissues infiltrated with local anesthetic. After cleaning the skin with an antiseptic solution, 1-2 mL of dilute local anesthetic was infiltrated subcutaneously at the planned injection site. The fingers of the palpating hand were used to straddle the insertion site at the inferior border of the rib and  fix the skin to avoid unwanted skin movement. Appropriate amount of time allowed to pass for local anesthetics to take effect. The procedure needles were then advanced to the target area at an angle of approximately 20  cephalad to the skin. Contact with the rib was made. While maintaining the same angle of insertion, the needle was walked off the inferior border of the rib as the skin was allowed to return to its initial position. Then the needle was advanced no more than 3 mm below the inferior margin of the rib. Proper needle placement was secured. Negative aspiration confirmed. Following negative aspiration for blood or air, 3-5 mL of local anesthetic was injected. The solution injected in intermittent fashion, asking for systemic symptoms every 0.5cc of injectate. The needle was then removed and the area cleansed, making sure to leave some of the prepping solution back to take advantage of its long term bactericidal properties. Vitals:   12/03/21 1020 12/03/21 1025 12/03/21 1030 12/03/21 1035  BP: (!) 176/99 (!) 173/103 (!) 177/92 (!) 172/104  Pulse:      Resp: Temp:      TempSrc:      SpO2: 97% 96% 97% 95%  Weight:      Height:        Start Time: 1015 hrs. End Time: 1032 hrs.   Materials:  Needle(s) Type: Regular needle Gauge: 25G Length: 3.5-in Medication(s): Please see orders for medications and dosing details. 10 cc solution made of 8 cc of 0.2% ropivacaine, 2 cc of Decadron 10 mg/cc.  2.5 cc injected at each level above bilaterally at T11, T12 after contrast confirmation.  Imaging Guidance (Non-Spinal):          Type of Imaging Technique: Fluoroscopy Guidance (Non-Spinal) Indication(s): Assistance in needle guidance and placement for procedures requiring needle placement in or near specific anatomical locations not easily accessible without such assistance. Exposure Time: Please see nurses notes. Contrast: Before injecting any contrast, we confirmed that the patient  did not have an allergy to iodine, shellfish, or radiological contrast. Once satisfactory needle placement was completed at the desired level, radiological contrast was injected. Contrast injected under live fluoroscopy. No contrast complications. See chart for type and volume of contrast used. Fluoroscopic Guidance: I was personally present during the use of fluoroscopy. "Tunnel Vision Technique" used to obtain the best possible view of the target area. Parallax error corrected before commencing the procedure. "Direction-depth-direction" technique used to introduce the needle under continuous pulsed fluoroscopy. Once target was reached, antero-posterior, oblique, and lateral fluoroscopic projection used confirm needle placement in all planes. Images permanently stored in EMR. Interpretation: I personally interpreted the imaging intraoperatively. Adequate needle placement confirmed in multiple planes. Appropriate spread of contrast into desired area was observed. No evidence of afferent or efferent intravascular uptake. Permanent images saved into the patient's record.   Post-operative Assessment:  Post-procedure Vital Signs:  Pulse/HCG Rate: 8779 Temp:  (!) 96.9 F (36.1 C) Resp: 14 BP:  (!) 172/104 SpO2: 95 %  EBL: None  Complications: No immediate post-treatment complications observed by team, or reported by patient.  Note: The patient tolerated the entire procedure well. A repeat set of vitals were taken after the procedure and the patient was kept under observation following institutional policy, for this type of procedure. Post-procedural neurological assessment was performed, showing return to baseline, prior to discharge. The patient was provided with post-procedure discharge instructions, including a section on how to identify potential problems. Should any problems arise concerning this procedure, the patient was given instructions to immediately contact us, at any time, without  hesitation. In any case, we plan to contact the patient by telephone for a follow-up status report regarding this interventional  procedure.  Comments:  No additional relevant information.  Pharmacotherapy Assessment  Analgesic: Morphine 15 mg twice daily, MME equals 30; Percocet 10 mg twice daily as needed for breakthrough pain,; MME equals 30.  Total MME equals 60    Monitoring: [place] PMP: PDMP reviewed during this encounter.       Pharmacotherapy: No side-effects or adverse reactions reported. Compliance: No problems identified. Effectiveness: Clinically acceptable.  UDS:  Summary  Date Value Ref Range Status  03/06/2021 Note  Final    Comment:    ==================================================================== [employer] (MW) ==================================================================== Test                             Result       Flag       Units  Drug Present and Declared for Prescription Verification   Oxycodone                      1031         EXPECTED   ng/mg creat   Oxymorphone                    760          EXPECTED   ng/mg creat   Noroxycodone                   4697         EXPECTED   ng/mg creat   Noroxymorphone                 277          EXPECTED   ng/mg creat    Sources of oxycodone are scheduled prescription medications.    Oxymorphone, noroxycodone, and noroxymorphone are expected    metabolites of oxycodone. Oxymorphone is also available as a    scheduled prescription medication.  Drug Present not Declared for Prescription Verification   Morphine                       10231        UNEXPECTED ng/mg creat   Normorphine                    549          UNEXPECTED ng/mg creat    Potential sources of large amounts of morphine in the absence of    codeine include administration of morphine or use of heroin.     Normorphine is an expected metabolite of morphine.  ==================================================================== Test                       Result    Flag   Units      Ref Range   Creatinine              35               mg/dL      >=58 ==================================================================== Declared Medications:  The flagging and interpretation on this report are based on the  following declared medications.  Unexpected results may arise from  inaccuracies in the declared medications.   **Note: The testing scope of this panel includes these medications:   Oxycodone (Percocet)   **Note: The testing scope of this panel does not include the  following reported medications:   Acetaminophen (Percocet)  Buspirone (Buspar)  Duloxetine (Cymbalta)  Folic Acid  Gabapentin (Neurontin)  Hydroxychloroquine  Hydroxyzine (Atarax)  Methotrexate  Omeprazole (Prilosec)  Prednisone (Deltasone)  Triamcinolone (Kenalog)  Zolpidem (Ambien) ==================================================================== For clinical consultation, please call [phone]. ====================================================================       Plan of Care  Orders:  Orders Placed This Encounter  Procedures   DG PAIN CLIN[facility] C-ARM 1-60 MIN NO REPORT    Intraoperative interpretation by procedural physician at [facility].    Standing Status:   Standing    Number of Occurrences:   1    Order Specific Question:   Reason for exam:    Answer:   Assistance in needle guidance and placement for procedures requiring needle placement in or near specific anatomical locations not easily accessible without such assistance.   Chronic Opioid Analgesic:  Morphine 15 mg twice daily, MME equals 30; Percocet 10 mg twice daily as needed for breakthrough pain,; MME equals 30.  Total MME equals 60    Medications ordered for procedure: Meds ordered this encounter  Medications   iohexol (OMNIPAQUE) 180 MG/ML injection 10 mL    Must be Myelogram-compatible. If not available, you may substitute with a water-soluble, non-ionic,  hypoallergenic, myelogram-compatible radiological contrast medium.   lidocaine (XYLOCAINE) 2 % (with pres) injection 400 mg   diazepam (VALIUM) tablet 5 mg    Make sure Flumazenil is available in the pyxis when using this medication. If oversedation occurs, administer 0.2 mg IV over 15 sec. If after 45 sec no response, administer 0.2 mg again over 1 min; may repeat at 1 min intervals; not to exceed 4 doses (1 mg)   dexamethasone (DECADRON) injection 10 mg   dexamethasone (DECADRON) injection 10 mg   ropivacaine (PF) 2 mg/mL (0.2%) (NAROPIN) injection 9 mL   oxyCODONE-acetaminophen (PERCOCET) 10-325 MG tablet    Sig: Take 1 tablet by mouth every 12 (twelve) hours as needed for pain (breakthrough pain). Ok to fill 1 day early if pharmacy closed on fill date    Dispense:  60 tablet    Refill:  0    For chronic pain syndrome   morphine (MS CONTIN) 15 MG 12 hr tablet    Sig: Take 1 tablet (15 mg total) by mouth every 12 (twelve) hours. Must last 30 days.    Dispense:  60 tablet    Refill:  0    [place] STOP ACT - Not applicable. Fill one day early if pharmacy is closed on scheduled refill date.   Medications administered: We administered iohexol, lidocaine, diazepam, dexamethasone, dexamethasone, and ropivacaine (PF) 2 mg/mL (0.2%).  See the medical record for exact dosing, route, and time of administration.  Follow-up plan:   Return in about 4 weeks (around 12/31/2021) for Medication Management, Post Procedure Evaluation, in person.       Left L5-S1 ESI #1,  01/06/2021, 6 cc injected           Recent Visits Date Type Provider Dept  11/24/21 Office Visit Edward Jolly, MD [facility]  10/16/21 Office Visit Edward Jolly, MD [facility]  10/01/21 Procedure visit Edward Jolly, MD [facility]  09/15/21 Office Visit Edward Jolly, MD [facility]  Showing recent visits within past 90 days and meeting all other requirements Today's Visits Date Type Provider  Dept  12/03/21 Procedure visit Edward Jolly, MD [facility]  Showing today's visits and meeting all other requirements Future Appointments Date Type Provider Dept  12/23/21 Appointment Edward Jolly, MD [facility]  Showing future  appointments within next 90 days and meeting all other requirements  Disposition: Discharge home  Discharge (Date   Time): 12/03/2021;   hrs.   Primary Care Physician: Kandyce Rud, MD Location: [facility] Note by: Edward Jolly, MD Date: 12/03/2021; Time: 11:25 AM  Disclaimer:  Medicine is not an exact science. The only guarantee in medicine is that nothing is guaranteed. It is important to note that the decision to proceed with this intervention was based on the information collected from the patient. The Data and conclusions were drawn from the patient's questionnaire, the interview, and the physical examination. Because the information was provided in large part by the patient, it cannot be guaranteed that it has not been purposely or unconsciously manipulated. Every effort has been made to obtain as much relevant data as possible for this evaluation. It is important to note that the conclusions that lead to this procedure are derived in large part from the available data. Always take into account that the treatment will also be dependent on availability of resources and existing treatment guidelines, considered by other Pain Management Practitioners as being common knowledge and practice, at the time of the intervention. For Medico-Legal purposes, it is also important to point out that variation in procedural techniques and pharmacological choices are the acceptable norm. The indications, contraindications, technique, and results of the above procedure should only be interpreted and judged by a Board-Certified Interventional Pain Specialist with extensive familiarity and expertise in the same exact procedure and  technique.

## 2021-12-04 ENCOUNTER — Telehealth: Payer: Self-pay | Admitting: *Deleted

## 2021-12-04 NOTE — Telephone Encounter (Signed)
Post procedure call, no questions or concerns. Feels that she is doing better.

## 2021-12-11 ENCOUNTER — Telehealth: Payer: Self-pay | Admitting: Student in an Organized Health Care Education/Training Program

## 2021-12-11 NOTE — Telephone Encounter (Signed)
Called patient back to let her know that [facility] does have both her Rx's and they will be ready for pickup on 12/13/21.  Voicemail left.

## 2021-12-11 NOTE — Telephone Encounter (Signed)
Patient called and states that [facility] has told her that they do not have her Rx's on file.  I can see in the chart where they were written on 12/03/21 to fill on 12/14/21 with a addendum that they may be filled 1 day early if pharmacy is closed on that day.  Patient states she has spoken with the pharmacy twice and they state they do not have the Rx's on file.    Attempted to call pharmacy, closed at this time, will retry later.

## 2021-12-11 NOTE — Telephone Encounter (Signed)
Patient lvmail stating [facility] is telling her they never received her scripts to be filled on 12-14-21. Please call [facility] pharmacy and verify. I see scripts in her chart. Please advise patient

## 2021-12-16 ENCOUNTER — Telehealth: Payer: Self-pay

## 2021-12-16 NOTE — Telephone Encounter (Signed)
This patient called and left several voicemails on my phone regarding her medications being sent to [facility]. Has this been taken care of?

## 2021-12-16 NOTE — Telephone Encounter (Signed)
Did you notice when these voicemails were left? It looks like this was resolved on 12-11-21.

## 2021-12-22 ENCOUNTER — Other Ambulatory Visit: Payer: Self-pay | Admitting: Family Medicine

## 2021-12-22 DIAGNOSIS — G4700 Insomnia, unspecified: Secondary | ICD-10-CM

## 2021-12-23 ENCOUNTER — Ambulatory Visit
Admission: RE | Admit: 2021-12-23 | Discharge: 2021-12-23 | Disposition: A | Discharge status: Home / Self Care | Payer: Medicare PPO | Source: Ambulatory Visit | Attending: Student in an Organized Health Care Education/Training Program | Admitting: Student in an Organized Health Care Education/Training Program

## 2021-12-23 ENCOUNTER — Ambulatory Visit
Admission: RE | Admit: 2021-12-23 | Discharge: 2021-12-23 | Disposition: A | Discharge status: Home / Self Care | Payer: Medicare PPO | Attending: Student in an Organized Health Care Education/Training Program | Admitting: Student in an Organized Health Care Education/Training Program

## 2021-12-23 ENCOUNTER — Telehealth: Payer: Self-pay | Admitting: *Deleted

## 2021-12-23 ENCOUNTER — Ambulatory Visit: Payer: Medicare PPO | Admitting: Student in an Organized Health Care Education/Training Program

## 2021-12-23 ENCOUNTER — Other Ambulatory Visit: Payer: Self-pay

## 2021-12-23 ENCOUNTER — Encounter: Payer: Self-pay | Admitting: Student in an Organized Health Care Education/Training Program

## 2021-12-23 VITALS — BP 171/98 | HR 84 | Temp 96.9°F | Resp 16 | Ht 69.0 in | Wt 209.0 lb

## 2021-12-23 DIAGNOSIS — G894 Chronic pain syndrome: Secondary | ICD-10-CM | POA: Insufficient documentation

## 2021-12-23 DIAGNOSIS — M722 Plantar fascial fibromatosis: Secondary | ICD-10-CM

## 2021-12-23 DIAGNOSIS — F4310 Post-traumatic stress disorder, unspecified: Secondary | ICD-10-CM | POA: Diagnosis not present

## 2021-12-23 DIAGNOSIS — G8929 Other chronic pain: Secondary | ICD-10-CM | POA: Insufficient documentation

## 2021-12-23 DIAGNOSIS — M25551 Pain in right hip: Secondary | ICD-10-CM

## 2021-12-23 DIAGNOSIS — M533 Sacrococcygeal disorders, not elsewhere classified: Secondary | ICD-10-CM | POA: Diagnosis not present

## 2021-12-23 DIAGNOSIS — M961 Postlaminectomy syndrome, not elsewhere classified: Secondary | ICD-10-CM | POA: Diagnosis not present

## 2021-12-23 DIAGNOSIS — G580 Intercostal neuropathy: Secondary | ICD-10-CM

## 2021-12-23 DIAGNOSIS — G588 Other specified mononeuropathies: Secondary | ICD-10-CM | POA: Diagnosis not present

## 2021-12-23 DIAGNOSIS — M5416 Radiculopathy, lumbar region: Secondary | ICD-10-CM

## 2021-12-23 DIAGNOSIS — M47816 Spondylosis without myelopathy or radiculopathy, lumbar region: Secondary | ICD-10-CM | POA: Diagnosis not present

## 2021-12-23 MED ORDER — OXYCODONE-ACETAMINOPHEN 10-325 MG PO TABS: 1.0000 | ORAL_TABLET | Freq: Two times a day (BID) | ORAL | 0 refills | Status: DC | PRN

## 2021-12-23 MED ORDER — ORPHENADRINE CITRATE 30 MG/ML IJ SOLN
30.0000 mg | Freq: Once | INTRAMUSCULAR | Status: AC
  Administered 2021-12-23: 30 mg via INTRAMUSCULAR

## 2021-12-23 MED ORDER — KETOROLAC TROMETHAMINE 30 MG/ML IJ SOLN
INTRAMUSCULAR | Status: AC
  Filled 2021-12-23: qty 1

## 2021-12-23 MED ORDER — MORPHINE SULFATE ER 15 MG PO TBCR: 15.0000 mg | EXTENDED_RELEASE_TABLET | Freq: Two times a day (BID) | ORAL | 0 refills | Status: AC

## 2021-12-23 MED ORDER — OXYCODONE-ACETAMINOPHEN 10-325 MG PO TABS: 1.0000 | ORAL_TABLET | Freq: Two times a day (BID) | ORAL | 0 refills | Status: AC | PRN

## 2021-12-23 MED ORDER — MORPHINE SULFATE ER 15 MG PO TBCR: 15.0000 mg | EXTENDED_RELEASE_TABLET | Freq: Two times a day (BID) | ORAL | 0 refills | Status: DC

## 2021-12-23 MED ORDER — ORPHENADRINE CITRATE 30 MG/ML IJ SOLN
INTRAMUSCULAR | Status: AC
  Filled 2021-12-23: qty 2

## 2021-12-23 MED ORDER — KETOROLAC TROMETHAMINE 30 MG/ML IJ SOLN
30.0000 mg | Freq: Once | INTRAMUSCULAR | Status: AC
  Administered 2021-12-23: 30 mg via INTRAMUSCULAR

## 2021-12-23 NOTE — Progress Notes (Signed)
Nursing Pain Medication Assessment:  Safety precautions to be maintained throughout the outpatient stay will include: orient to surroundings, keep bed in low position, maintain call bell within reach at all times, provide assistance with transfer out of bed and ambulation.  Medication Inspection Compliance: Pill count conducted under aseptic conditions, in front of the patient. Neither the pills nor the bottle was removed from the patient's sight at any time. Once count was completed pills were immediately returned to the patient in their original bottle.  Medication: Morphine ER (MSContin) Pill/Patch Count:  41 of 60 pills remain Pill/Patch Appearance: Markings consistent with prescribed medication Bottle Appearance: Standard pharmacy container. Clearly labeled. Filled Date: 6 / 70 / 2022 Last Medication intake:  Today  Oxycodone?acetaminophen 36.5/60 Filled 12-13-2021 today

## 2021-12-23 NOTE — Telephone Encounter (Signed)
-----   Message from Gillis Santa, MD sent at 12/23/2021  2:56 PM EST ----- Regarding: call pt with xray results Please call patient with xray results If continued pain, can consider piriformis TPI but nothing concerning on hip film ----- Message ----- From: Interface, Rad Results In Sent: 12/23/2021   2:44 PM EST To: Gillis Santa, MD

## 2021-12-23 NOTE — Progress Notes (Signed)
PROVIDER NOTE: Information contained herein reflects review and annotations entered in association with encounter. Interpretation of such information and data should be left to medically-trained personnel. Information provided to patient can be located elsewhere in the medical record under "Patient Instructions". Document created using STT-dictation technology, any transcriptional errors that may result from process are unintentional.    Patient: Emily Landry  Service Category: E/M  Provider: Gillis Santa, MD  DOB: Aug 28, 1951  DOS: 12/23/2021  Specialty: Interventional Pain Management  MRN: [id number]  Setting: Ambulatory outpatient  PCP: Derinda Late, MD  Type: Established Patient    Referring Provider: Derinda Late, MD  Location: Office  Delivery: Face-to-face     HPI  Emily Landry, a 71 y.o. 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 Emily Landry primary complain today is Back Pain (low) and Hip Pain (right) Last encounter: My last encounter with her was on 12/11/2021. Pertinent problems: Emily Landry has Chronic pain syndrome; Failed back surgical syndrome; Chronic radicular lumbar pain; Chronic pain of both shoulders; PTSD (post-traumatic stress disorder); Plantar fasciitis of right foot; and Intercostal neuralgia on their pertinent problem list. Pain Assessment: Severity of Chronic pain is reported as a 9 /10. Location: Back Lower/radiates into right hip. Onset: More than a month ago. Quality: Constant (deep). Timing: Constant. Modifying factor(s): nothing. Vitals:  height is _0  (1.753 m) and weight is 209 lb (94.8 kg). Her temperature is 96.9 F (36.1 C) (abnormal). Her blood pressure is 171/98 (abnormal) and her pulse is 84. Her respiration is 16 and oxygen saturation is 98%.   Reason for encounter: post-procedure evaluation and assessment.    -Patient presents with increased right hip pain as well as right buttock pain.  This is worse when  supine and laying down and gets better when she is standing and stretching.  No inciting or traumatic event.  This is new for her.  She has not been told that she has hip pathology.  She does have history of fibromyalgia so could certainly be musculoskeletal pain.  We will obtain x-rays of hips and SI joints. -Mild benefit after intercostal nerve block done previously.  See below. -Refill of chronic pain regimen as below.  Post-procedure evaluation     Procedure:          Anesthesia, Analgesia, Anxiolysis:  Type: Diagnostic Posterolateral Intercostal  Nerve Block           Region: Posterolateral  Thoracic Area Level: T11, T12 Ribs Laterality:  Bilateral  Anesthesia: Local (1-2% Lidocaine)  Anxiolysis: Oral Valium 5 mg PO  Sedation: None  Guidance: Fluoroscopy           Position: Prone   Indications: 1. Chronic pain syndrome   2. Intercostal neuralgia   3. Chronic radicular lumbar pain   4. Lumbar radiculopathy   5. Failed back surgical syndrome    Pain Score: Pre-procedure: 6 /10 Post-procedure: 6 /10   Emily Landry has significant radiating intercostal pain.  History shingles in the lower thoracic region.  Plan for diagnostic bilateral T11, T12 intercostal nerve block under fluoroscopy. Med refill as below     Effectiveness:  Initial hour after procedure: 20 %  Subsequent 4-6 hours post-procedure: 20 %  Analgesia past initial 6 hours: 20 %  Ongoing improvement:  Analgesic: 20%  Pharmacotherapy Assessment  Analgesic: Morphine 15 mg twice daily, MME equals 30; Percocet 10 mg twice daily as needed for breakthrough pain,; MME equals 30.  Total MME equals 60  Monitoring: [place] PMP: PDMP reviewed during this encounter.       Pharmacotherapy: No side-effects or adverse reactions reported. Compliance: No problems identified. Effectiveness: Clinically acceptable.  Emily Shorter, RN  12/23/2021  8:12 AM  Sign when Signing Visit Nursing Pain Medication Assessment:  Safety precautions  to be maintained throughout the outpatient stay will include: orient to surroundings, keep bed in low position, maintain call bell within reach at all times, provide assistance with transfer out of bed and ambulation.  Medication Inspection Compliance: Pill count conducted under aseptic conditions, in front of the patient. Neither the pills nor the bottle was removed from the patient's sight at any time. Once count was completed pills were immediately returned to the patient in their original bottle.  Medication: Morphine ER (MSContin) Pill/Patch Count:  41 of 60 pills remain Pill/Patch Appearance: Markings consistent with prescribed medication Bottle Appearance: Standard pharmacy container. Clearly labeled. Filled Date: 58 / 12 / 2022 Last Medication intake:  Today  Oxycodone?acetaminophen 36.5/60 Filled 12-13-2021 today     UDS:  Summary  Date Value Ref Range Status  03/06/2021 Note  Final    Comment:    ==================================================================== [employer] Select 13 (MW) ==================================================================== Test                             Result       Flag       Units  Drug Present and Declared for Prescription Verification   Oxycodone                      1031         EXPECTED   ng/mg creat   Oxymorphone                    760          EXPECTED   ng/mg creat   Noroxycodone                   4697         EXPECTED   ng/mg creat   Noroxymorphone                 277          EXPECTED   ng/mg creat    Sources of oxycodone are scheduled prescription medications.    Oxymorphone, noroxycodone, and noroxymorphone are expected    metabolites of oxycodone. Oxymorphone is also available as a    scheduled prescription medication.  Drug Present not Declared for Prescription Verification   Morphine                       10231        UNEXPECTED ng/mg creat   Normorphine                    549          UNEXPECTED ng/mg creat     Potential sources of large amounts of morphine in the absence of    codeine include administration of morphine or use of heroin.     Normorphine is an expected metabolite of morphine.  ==================================================================== Test                      Result    Flag   Units      Ref Range   Creatinine  35               mg/dL      >=20 ==================================================================== Declared Medications:  The flagging and interpretation on this report are based on the  following declared medications.  Unexpected results may arise from  inaccuracies in the declared medications.   **Note: The testing scope of this panel includes these medications:   Oxycodone (Percocet)   **Note: The testing scope of this panel does not include the  following reported medications:   Acetaminophen (Percocet)  Buspirone (Buspar)  Duloxetine (Cymbalta)  Folic Acid  Gabapentin (Neurontin)  Hydroxychloroquine  Hydroxyzine (Atarax)  Methotrexate  Omeprazole (Prilosec)  Prednisone (Deltasone)  Triamcinolone (Kenalog)  Zolpidem (Ambien) ==================================================================== For clinical consultation, please call [phone]. ====================================================================      ROS  Constitutional: Denies any fever or chills Gastrointestinal: No reported hemesis, hematochezia, vomiting, or acute GI distress Musculoskeletal:  Right hip pain, right buttock pain. Neurological: No reported episodes of acute onset apraxia, aphasia, dysarthria, agnosia, amnesia, paralysis, loss of coordination, or loss of consciousness  Medication Review  DULoxetine, buPROPion, folic acid, gabapentin, methocarbamol, methotrexate, morphine, omeprazole, oxyCODONE-acetaminophen, and triamcinolone cream  History Review  Allergy: Emily Landry has No Known Allergies. Drug: Emily Landry  reports no history of  drug use. Alcohol:  reports current alcohol use. Tobacco:  reports that she has never smoked. She has never used smokeless tobacco. Social: Emily Landry  reports that she has never smoked. She has never used smokeless tobacco. She reports current alcohol use. She reports that she does not use drugs. Medical:  has a past medical history of Anxiety, Arthritis, Depression, and GERD (gastroesophageal reflux disease). Surgical: Emily Landry  has a past surgical history that includes Laminectomy (1971); Lumbar disc surgery (1991); Tonsillectomy; Cholecystectomy; and Abdominal hysterectomy. Family: family history includes Alcohol abuse in her father; Arthritis in her father; Breast cancer (age of onset: 33) in her mother; Diabetes in her mother; Gout in her father; Rheum arthritis in her mother.  Laboratory Chemistry Profile   Renal Lab Results  Component Value Date   BUN 11 05/21/2020   CREATININE 0.6 05/21/2020    Hepatic Lab Results  Component Value Date   AST 31 05/21/2020   ALT 22 05/21/2020   ALBUMIN 4.2 05/21/2020   ALKPHOS 71 05/21/2020    Electrolytes Lab Results  Component Value Date   NA 142 05/21/2020   K 4.9 05/21/2020   CL 104 05/21/2020   CALCIUM 9.1 05/21/2020    Bone No results found for: VD25OH, VD125OH2TOT, GY5638LH7, DS2876OT1, 25OHVITD1, 25OHVITD2, 25OHVITD3, TESTOFREE, TESTOSTERONE  Inflammation (CRP: Acute Phase) (ESR: Chronic Phase) Lab Results  Component Value Date   ESRSEDRATE 6 12/06/2019         Note: Above Lab results reviewed.  Physical Exam  General appearance: Well nourished, well developed, and well hydrated. In no apparent acute distress Mental status: Alert, oriented x 3 (person, place, & time)       Respiratory: No evidence of acute respiratory distress Eyes: PERLA Vitals: BP (!) 171/98    Pulse 84    Temp (!) 96.9 F (36.1 C)    Resp 16    Ht _0  (1.753 m)    Wt 209 lb (94.8 kg)    SpO2 98%    BMI 30.86 kg/m  BMI: Estimated body mass  index is 30.86 kg/m as calculated from the following:   Height as of this encounter: _1  (1.753 m).   Weight as of this encounter:  209 lb (94.8 kg). Ideal: Ideal body weight: 66.2 kg (145 lb 15.1 oz) Adjusted ideal body weight: 77.6 kg (171 lb 2.7 oz)  Assessment   Status Diagnosis  Controlled Controlled Controlled 1. Intercostal neuralgia   2. Chronic radicular lumbar pain   3. Lumbar radiculopathy   4. Failed back surgical syndrome   5. Plantar fasciitis of right foot   6. PTSD (post-traumatic stress disorder)   7. Lumbar post-laminectomy syndrome   8. Chronic pain syndrome   9. Right hip pain      Updated Problems: Problem  Intercostal Neuralgia  Ptsd (Post-Traumatic Stress Disorder)  Plantar Fasciitis of Right Foot  Chronic Pain of Both Shoulders  Failed Back Surgical Syndrome  Chronic Radicular Lumbar Pain  Chronic Pain Syndrome    Plan of Care   Emily Landry has a current medication list which includes the following long-term medication(s): bupropion, bupropion, duloxetine, gabapentin, and omeprazole.  Pharmacotherapy (Medications Ordered): Meds ordered this encounter  Medications   morphine (MS CONTIN) 15 MG 12 hr tablet    Sig: Take 1 tablet (15 mg total) by mouth every 12 (twelve) hours. Must last 30 days.    Dispense:  60 tablet    Refill:  0    [place] STOP ACT - Not applicable. Fill one day early if pharmacy is closed on scheduled refill date.   morphine (MS CONTIN) 15 MG 12 hr tablet    Sig: Take 1 tablet (15 mg total) by mouth every 12 (twelve) hours. Must last 30 days.    Dispense:  60 tablet    Refill:  0    [place] STOP ACT - Not applicable. Fill one day early if pharmacy is closed on scheduled refill date.   morphine (MS CONTIN) 15 MG 12 hr tablet    Sig: Take 1 tablet (15 mg total) by mouth every 12 (twelve) hours. Must last 30 days.    Dispense:  60 tablet    Refill:  0    [place] STOP ACT - Not applicable. Fill one day early if pharmacy is closed  on scheduled refill date.   oxyCODONE-acetaminophen (PERCOCET) 10-325 MG tablet    Sig: Take 1 tablet by mouth every 12 (twelve) hours as needed for pain (breakthrough pain). Ok to fill 1 day early if pharmacy closed on fill date    Dispense:  60 tablet    Refill:  0    For chronic pain syndrome   oxyCODONE-acetaminophen (PERCOCET) 10-325 MG tablet    Sig: Take 1 tablet by mouth every 12 (twelve) hours as needed for pain (breakthrough pain). Ok to fill 1 day early if pharmacy closed on fill date    Dispense:  60 tablet    Refill:  0    For chronic pain syndrome   oxyCODONE-acetaminophen (PERCOCET) 10-325 MG tablet    Sig: Take 1 tablet by mouth every 12 (twelve) hours as needed for pain (breakthrough pain). Ok to fill 1 day early if pharmacy closed on fill date    Dispense:  60 tablet    Refill:  0    For chronic pain syndrome   orphenadrine (NORFLEX) injection 30 mg   ketorolac (TORADOL) 30 MG/ML injection 30 mg   Orders:  Orders Placed This Encounter  Procedures   DG Si Joints    Standing Status:   Future    Standing Expiration Date:   01/23/2022    Scheduling Instructions:     Imaging must be done as soon as  possible. Inform patient that order will expire within 30 days and I will not renew it.    Order Specific Question:   Reason for Exam (SYMPTOM  OR DIAGNOSIS REQUIRED)    Answer:   Sacroiliac joint pain    Order Specific Question:   Preferred imaging location?    Answer:   [facility]    Order Specific Question:   Call Results- Best Contact Number?    Answer:   [phone] ([facility])    Order Specific Question:   Release to patient    Answer:   Immediate   DG HIP UNILAT W OR W/O PELVIS 2-3 VIEWS RIGHT    Please describe any evidence of DJD, such as joint narrowing, asymmetry, cysts, or any anomalies in bone density, production, or erosion.    Standing Status:   Future    Standing Expiration Date:   01/23/2022    Scheduling Instructions:     Imaging must be  done as soon as possible. Inform patient that order will expire within 30 days and I will not renew it.    Order Specific Question:   Reason for Exam (SYMPTOM  OR DIAGNOSIS REQUIRED)    Answer:   Sacroiliac joint pain    Order Specific Question:   Preferred imaging location?    Answer:   [facility]    Order Specific Question:   Call Results- Best Contact Number?    Answer:   [phone] ([facility])    Order Specific Question:   Release to patient    Answer:   Immediate   Follow-up plan:   Return in about 4 months (around 04/09/2022) for Medication Management, in person.    Recent Visits Date Type Provider Dept  12/03/21 Procedure visit Gillis Santa, MD [facility]  11/24/21 Office Visit Gillis Santa, MD [facility]  10/16/21 Office Visit Gillis Santa, MD [facility]  10/01/21 Procedure visit Gillis Santa, MD [facility]  Showing recent visits within past 90 days and meeting all other requirements Today's Visits Date Type Provider Dept  12/23/21 Office Visit Gillis Santa, MD [facility]  Showing today's visits and meeting all other requirements Future Appointments No visits were found meeting these conditions. Showing future appointments within next 90 days and meeting all other requirements  I discussed the assessment and treatment plan with the patient. The patient was provided an opportunity to ask questions and all were answered. The patient agreed with the plan and demonstrated an understanding of the instructions.  Patient advised to call back or seek an in-person evaluation if the symptoms or condition worsens.  Duration of encounter: 28mnutes.  Note by: BGillis Santa MD Date: 12/23/2021; Time: 8:46 AM

## 2021-12-23 NOTE — Telephone Encounter (Signed)
Patient contacted, results of x-rays read.

## 2021-12-29 ENCOUNTER — Ambulatory Visit: Payer: Medicare PPO | Admitting: Psychiatry

## 2022-01-02 ENCOUNTER — Other Ambulatory Visit: Payer: Self-pay | Admitting: Psychiatry

## 2022-01-05 ENCOUNTER — Ambulatory Visit (INDEPENDENT_AMBULATORY_CARE_PROVIDER_SITE_OTHER): Payer: Medicare PPO | Admitting: Licensed Clinical Social Worker

## 2022-01-05 ENCOUNTER — Other Ambulatory Visit: Payer: Self-pay

## 2022-01-05 DIAGNOSIS — F4310 Post-traumatic stress disorder, unspecified: Secondary | ICD-10-CM | POA: Diagnosis not present

## 2022-01-05 NOTE — Progress Notes (Signed)
Virtual Visit via Audio Note  I connected with Emily Landry on 01/05/22 at  9:00 AM EST by an audio enabled telemedicine application and verified that I am speaking with the correct person using two identifiers.  Location: Patient: home Provider: remote office [facility]([place], [place])   I discussed the limitations of evaluation and management by telemedicine and the availability of in person appointments. The patient expressed understanding and agreed to proceed.  I discussed the assessment and treatment plan with the patient. The patient was provided an opportunity to ask questions and all were answered. The patient agreed with the plan and demonstrated an understanding of the instructions.   The patient was advised to call back or seek an in-person evaluation if the symptoms worsen or if the condition fails to improve as anticipated.  I provided 40 minutes of non-face-to-face time during this encounter.   Emily Landry R Emily Pokorney, LCSW   THERAPIST PROGRESS NOTE  Session Time: [phone]  Participation Level: Active  Behavioral Response: NeatAlertAnxious and Depressed  Type of Therapy: Individual Therapy  Treatment Goals addressed:  Problem: Decrease depressive symptoms and improve levels of effective functioning  Goal: LTG: Reduce frequency, intensity, and duration of depression symptoms as evidenced by: pt self report Outcome: Progressing  Goal: STG: Andrell WILL PARTICIPATE IN AT LEAST 80% OF SCHEDULED INDIVIDUAL PSYCHOTHERAPY SESSIONS Outcome: Progressing  Problem: Reduce overall frequency, intensity, and duration of the anxiety so that daily functioning is not impaired.  Goal: LTG: Patient will score less than 5 on the Generalized Anxiety Disorder 7 Scale (GAD-7) Outcome: Progressing  Goal: STG: Patient will participate in at least 80% of scheduled individual psychotherapy sessions Outcome: Progressing  Interventions:   Intervention: REVIEW PLEASE SKILLS (TREAT PHYSICAL  ILLNESS, BALANCE EATING, AVOID MOOD-ALTERING SUBSTANCES, BALANCE SLEEP AND GET EXERCISE) WITH Tien  Intervention: Perform psychoeducation regarding anxiety disorders  Intervention: Perform motivational interviewing regarding use of tools  Summary: Emily Landry is a 71 y.o. female who presents with improving symptoms related to PTSD. Pt reports mood stable (some down days) and that she is managing stress and anxiety as best she can.   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 Pt reports that she wants to be able to do the recreational activities (gardening) that she used to enjoy "but I just can't do it anymore and I just give up" "I want to get back to the way I used to be before". Discussed physical limitations and how to work around them to still enjoy activities. Discussed breaking up activities into smaller pieces/chunks helping pt feel a sense of accomplishment. Pt reports that she is spending more time walking her dog and enjoys spending time and has even lost 10 lbs with the walking. Pt reports that financial stress is a huge stressor recently. Pt reports that she feels that she should get a part time job to help pay medical bills. Discussed recent health-related concerns--pt feels that doctors in [place] are not as liberal [place]. Pt states that her pain is not being managed as well as she would like for it to be. Discussed previous surgical interventions pt has had in the past. Pt reports stress over current health status of SI--not responding to pts texts anymore.    Reviewed managing depression and anxiety symptoms and importance of self care to  help balance caregiver stress.,        Continued recommendations are as follows: self care behaviors, positive social  engagements, focusing on overall work/home/life balance, and focusing on positive physical and emotional wellness.   Suicidal/Homicidal: No  Therapist Response: Pt is continuing to apply interventions learned in session into daily life situations. Pt is currently on track to meet goals utilizing interventions mentioned above. Personal growth and progress noted. Treatment to continue as indicated.   Plan: Return again in 4 weeks.  Diagnosis: Axis I: PTSD    Axis II: No diagnosis    Emily Haber Rj Pedrosa, LCSW 01/05/2022
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
## 2022-01-12 ENCOUNTER — Ambulatory Visit: Payer: Medicare PPO | Admitting: Psychiatry

## 2022-01-24 ENCOUNTER — Encounter: Payer: Self-pay | Admitting: Student in an Organized Health Care Education/Training Program

## 2022-01-29 ENCOUNTER — Encounter: Payer: Self-pay | Admitting: Student in an Organized Health Care Education/Training Program

## 2022-02-04 ENCOUNTER — Other Ambulatory Visit: Payer: Self-pay | Admitting: Psychiatry

## 2022-02-04 ENCOUNTER — Telehealth: Payer: Self-pay | Admitting: Psychiatry

## 2022-02-04 NOTE — Telephone Encounter (Signed)
I have not seen her since last Oct. Please contact the patient and make follow up appointment- I will not be able to order refills without evaluation.

## 2022-02-05 NOTE — Telephone Encounter (Signed)
Tc on 02-05-22 @ 1:50 pt states that she not seeing you again that Dr. Larwance Sachs is filling her medications.

## 2022-02-05 NOTE — Telephone Encounter (Signed)
Noted, thanks!

## 2022-02-19 DIAGNOSIS — G8929 Other chronic pain: Secondary | ICD-10-CM | POA: Diagnosis not present

## 2022-02-19 DIAGNOSIS — M19042 Primary osteoarthritis, left hand: Secondary | ICD-10-CM | POA: Diagnosis not present

## 2022-02-19 DIAGNOSIS — M5442 Lumbago with sciatica, left side: Secondary | ICD-10-CM | POA: Diagnosis not present

## 2022-02-19 DIAGNOSIS — M19041 Primary osteoarthritis, right hand: Secondary | ICD-10-CM | POA: Diagnosis not present

## 2022-02-19 DIAGNOSIS — M0609 Rheumatoid arthritis without rheumatoid factor, multiple sites: Secondary | ICD-10-CM | POA: Diagnosis not present

## 2022-02-19 DIAGNOSIS — M069 Rheumatoid arthritis, unspecified: Secondary | ICD-10-CM | POA: Diagnosis not present

## 2022-02-19 DIAGNOSIS — Z79899 Other long term (current) drug therapy: Secondary | ICD-10-CM | POA: Diagnosis not present

## 2022-02-20 DIAGNOSIS — G8929 Other chronic pain: Secondary | ICD-10-CM | POA: Diagnosis not present

## 2022-02-20 DIAGNOSIS — M5442 Lumbago with sciatica, left side: Secondary | ICD-10-CM | POA: Diagnosis not present

## 2022-02-20 DIAGNOSIS — M47816 Spondylosis without myelopathy or radiculopathy, lumbar region: Secondary | ICD-10-CM | POA: Diagnosis not present

## 2022-02-22 ENCOUNTER — Other Ambulatory Visit: Payer: Self-pay | Admitting: Gastroenterology

## 2022-03-03 ENCOUNTER — Ambulatory Visit (INDEPENDENT_AMBULATORY_CARE_PROVIDER_SITE_OTHER): Payer: Medicare PPO | Admitting: Licensed Clinical Social Worker

## 2022-03-03 ENCOUNTER — Other Ambulatory Visit: Payer: Self-pay

## 2022-03-03 DIAGNOSIS — F331 Major depressive disorder, recurrent, moderate: Secondary | ICD-10-CM | POA: Diagnosis not present

## 2022-03-03 DIAGNOSIS — F4310 Post-traumatic stress disorder, unspecified: Secondary | ICD-10-CM | POA: Diagnosis not present

## 2022-03-03 NOTE — Plan of Care (Signed)
?  Problem: Decrease depressive symptoms and improve levels of effective functioning ?Goal: LTG: Reduce frequency, intensity, and duration of depression symptoms as evidenced by: pt self report ?Outcome: Progressing ?Goal: STG: Dashia WILL PARTICIPATE IN AT LEAST 80% OF SCHEDULED INDIVIDUAL PSYCHOTHERAPY SESSIONS ?Outcome: Progressing ?Intervention: Encourage verbalization of feelings/concerns/expectations ?Intervention: Encourage new environment or opportunities for social interaction ?  ?Problem: Reduce overall frequency, intensity, and duration of the anxiety so that daily functioning is not impaired. ?Goal: LTG: Patient will score less than 5 on the Generalized Anxiety Disorder 7 Scale (GAD-7) ?Outcome: Progressing ?Goal: STG: Patient will participate in at least 80% of scheduled individual psychotherapy sessions ?Outcome: Progressing ?Intervention: Encourage patient to identify triggers ?Intervention: Assist with relaxation techniques, as appropriate (deep breathing exercises, meditation, guided imagery) ?  ?

## 2022-03-03 NOTE — Plan of Care (Signed)
?  Problem: Decrease depressive symptoms and improve levels of effective functioning ?Goal: LTG: Reduce frequency, intensity, and duration of depression symptoms as evidenced by: pt self report ?Outcome: Progressing ?Goal: STG: Vertie WILL PARTICIPATE IN AT LEAST 80% OF SCHEDULED INDIVIDUAL PSYCHOTHERAPY SESSIONS ?Outcome: Progressing ?Intervention: Encourage verbalization of feelings/concerns/expectations ?Intervention: Encourage new environment or opportunities for social interaction ?  ?Problem: Reduce overall frequency, intensity, and duration of the anxiety so that daily functioning is not impaired. ?Goal: LTG: Patient will score less than 5 on the Generalized Anxiety Disorder 7 Scale (GAD-7) ?Outcome: Progressing ?Goal: STG: Patient will participate in at least 80% of scheduled individual psychotherapy sessions ?Outcome: Progressing ?Intervention: Encourage patient to identify triggers ?Intervention: Assist with relaxation techniques, as appropriate (deep breathing exercises, meditation, guided imagery) ?  ?

## 2022-03-03 NOTE — Progress Notes (Signed)
Virtual Visit via Audio Note ? ?I connected with Emily Landry on 03/04/22 at  9:00 AM EDT by an audio enabled telemedicine application and verified that I am speaking with the correct person using two identifiers. ? ?Location: ?Patient: home ?Provider: remote office [place], [place]) ?  ?I discussed the limitations of evaluation and management by telemedicine and the availability of in person appointments. The patient expressed understanding and agreed to proceed. ? ?I discussed the assessment and treatment plan with the patient. The patient was provided an opportunity to ask questions and all were answered. The patient agreed with the plan and demonstrated an understanding of the instructions. ?  ?The patient was advised to call back or seek an in-person evaluation if the symptoms worsen or if the condition fails to improve as anticipated. ? ?I provided 42 minutes of non-face-to-face time during this encounter. ? ? ?Chiquetta Langner R Anita Laguna, LCSW ? ? ?THERAPIST PROGRESS NOTE ? ?Session Time: [phone] ? ?Participation Level: Active ? ?Behavioral Response: NAAlertDepressed ? ?Type of Therapy: Individual Therapy ? ?Treatment Goals addressed: Problem: Decrease depressive symptoms and improve levels of effective functioning ?Goal: LTG: Reduce frequency, intensity, and duration of depression symptoms as evidenced by: pt self report ?Outcome: Progressing ? ?Goal: STG: Sharaya WILL PARTICIPATE IN AT LEAST 80% OF SCHEDULED INDIVIDUAL PSYCHOTHERAPY SESSIONS ?Outcome: Progressing ? ?Problem: Reduce overall frequency, intensity, and duration of the anxiety so that daily functioning is not impaired. ?Goal: LTG: Patient will score less than 5 on the Generalized Anxiety Disorder 7 Scale (GAD-7) ?Outcome: Progressing ? ?Goal: STG: Patient will participate in at least 80% of scheduled individual psychotherapy sessions ?Outcome: Progressing ? ?ProgressTowards Goals: Progressing ? ?Interventions: CBT ?Intervention: Encourage verbalization  of feelings/concerns/expectations ?Intervention: Encourage new environment or opportunities for social interaction ?Intervention: Encourage patient to identify triggers ?Intervention: Assist with relaxation techniques, as appropriate (deep breathing exercises, meditation, guided imagery) ?  ? ?Summary: Emily Landry is a 71 y.o. female who presents with improving symptoms related to PTSD and depression. Pt reports overall mood has been stable and that she is managing situational stressors well. 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 .  ? ?Allowed pt to explore and express thoughts and feelings associated with recent life situations and external stressors.Discussed chronic pain and overall psychological impact. Pt finds it exhausting to attend multiple doctor appointments and also take her husband to multiple appointments. Allowed pt to explore and express her thoughts and feelings about caregiving and any underlying frustrations associated with it.  ? ?Encouraged pt to be intentional about self care each day to help maintain overall balance and wellness.  ? ?Pt is working hard to incorporate physical activity/walking into her routine multiple times per day.  ? ?Continued recommendations are as follows: self care behaviors, positive social engagements, focusing on overall work/home/life balance, and focusing on positive physical and emotional wellness.  ? ? ?Suicidal/Homicidal: No ? ?Therapist Response: Pt is continuing to apply interventions learned in session into daily life situations. Pt is currently on track to meet goals utilizing interventions mentioned above. Personal growth and progress noted. Treatment to continue as indicated.  ? ?Plan: Return again in 4 weeks. ? ?Diagnosis: PTSD (post-traumatic stress disorder) ? ?MDD (major depressive disorder), recurrent episode, moderate (HCC) ? ?Collaboration of Care: Other pt encouraged  to continue care with psychiatrist of record, Dr. Vanetta Shawl ? ?Patient/Guardian was advised Release of Information must be obtained prior to any record release in order to  collaborate their care with an outside provider. Patient/Guardian was advised if they have not already done so to contact the registration department to sign all necessary forms in order for [place] to release information regarding their care.  ? ?Consent: Patient/Guardian gives verbal consent for treatment and assignment of benefits for services provided during this visit. Patient/Guardian expressed understanding and agreed to proceed.  ? ?Talayah Picardi R Chrissa Meetze, LCSW ?03/04/2022 ? ?

## 2022-03-11 DIAGNOSIS — M5441 Lumbago with sciatica, right side: Secondary | ICD-10-CM | POA: Diagnosis not present

## 2022-03-11 DIAGNOSIS — M5442 Lumbago with sciatica, left side: Secondary | ICD-10-CM | POA: Diagnosis not present

## 2022-03-11 DIAGNOSIS — G8929 Other chronic pain: Secondary | ICD-10-CM | POA: Diagnosis not present

## 2022-03-16 DIAGNOSIS — M5442 Lumbago with sciatica, left side: Secondary | ICD-10-CM | POA: Diagnosis not present

## 2022-03-16 DIAGNOSIS — G8929 Other chronic pain: Secondary | ICD-10-CM | POA: Diagnosis not present

## 2022-03-16 DIAGNOSIS — M5441 Lumbago with sciatica, right side: Secondary | ICD-10-CM | POA: Diagnosis not present

## 2022-03-26 ENCOUNTER — Encounter: Payer: Self-pay | Admitting: Student in an Organized Health Care Education/Training Program

## 2022-03-26 ENCOUNTER — Ambulatory Visit
Payer: Medicare PPO | Attending: Student in an Organized Health Care Education/Training Program | Admitting: Student in an Organized Health Care Education/Training Program

## 2022-03-26 VITALS — BP 152/93 | HR 100 | Temp 97.0°F | Resp 16 | Ht 67.0 in | Wt 209.0 lb

## 2022-03-26 DIAGNOSIS — M5416 Radiculopathy, lumbar region: Secondary | ICD-10-CM | POA: Diagnosis not present

## 2022-03-26 DIAGNOSIS — M961 Postlaminectomy syndrome, not elsewhere classified: Secondary | ICD-10-CM | POA: Insufficient documentation

## 2022-03-26 DIAGNOSIS — M722 Plantar fascial fibromatosis: Secondary | ICD-10-CM | POA: Diagnosis not present

## 2022-03-26 DIAGNOSIS — G8929 Other chronic pain: Secondary | ICD-10-CM | POA: Insufficient documentation

## 2022-03-26 DIAGNOSIS — G894 Chronic pain syndrome: Secondary | ICD-10-CM | POA: Insufficient documentation

## 2022-03-26 DIAGNOSIS — F4310 Post-traumatic stress disorder, unspecified: Secondary | ICD-10-CM | POA: Insufficient documentation

## 2022-03-26 MED ORDER — MORPHINE SULFATE ER 15 MG PO TBCR: 15.0000 mg | EXTENDED_RELEASE_TABLET | Freq: Two times a day (BID) | ORAL | 0 refills | Status: AC

## 2022-03-26 MED ORDER — OXYCODONE-ACETAMINOPHEN 10-325 MG PO TABS: 1.0000 | ORAL_TABLET | Freq: Two times a day (BID) | ORAL | 0 refills | Status: DC | PRN

## 2022-03-26 MED ORDER — MORPHINE SULFATE ER 15 MG PO TBCR: 15.0000 mg | EXTENDED_RELEASE_TABLET | Freq: Two times a day (BID) | ORAL | 0 refills | Status: DC

## 2022-03-26 MED ORDER — OXYCODONE-ACETAMINOPHEN 10-325 MG PO TABS: 1.0000 | ORAL_TABLET | Freq: Two times a day (BID) | ORAL | 0 refills | Status: AC | PRN

## 2022-03-26 NOTE — Progress Notes (Signed)
PROVIDER NOTE: Information contained herein reflects review and annotations entered in association with encounter. Interpretation of such information and data should be left to medically-trained personnel. Information provided to patient can be located elsewhere in the medical record under "Patient Instructions". Document created using STT-dictation technology, any transcriptional errors that may result from process are unintentional.  ?  ?Patient: Emily Landry  Service Category: E/M  Provider: Edward Jolly, MD  ?DOB: 28-Nov-1951  DOS: 03/26/2022  Specialty: Interventional Pain Management  ?MRN: [id number]  Setting: Ambulatory outpatient  PCP: Kandyce Rud, MD  ?Type: Established Patient    Referring Provider: Kandyce Rud, MD  ?Location: Office  Delivery: Face-to-face    ? ?HPI  ?Emily Landry, a 71 y.o. year old female, is here today because of her Chronic radicular lumbar pain [M54.16, G89.29]. Emily Landry primary complain today is Back Pain (lower) ?Last encounter: My last encounter with her was on 12/23/21 ?Pertinent problems: [name] has Chronic pain syndrome; Failed back surgical syndrome; Lumbar radiculopathy; Chronic pain of both shoulders; PTSD (post-traumatic stress disorder); Plantar fasciitis of right foot; and Intercostal neuralgia on their pertinent problem list. ?Pain Assessment: Severity of Chronic pain is reported as a 8 /10. Location: Back Lower/down right leg to big toe; intensity of radiating pain is not as severe as it was before procedure. Onset: More than a month ago. Quality: Sharp, Constant. Timing: Constant. Modifying factor(s): meds. ?Vitals:  height is  (1.702 m) and weight is 209 lb (94.8 kg). Her temporal temperature is 97 ?F (36.1 ?C) (abnormal). Her blood pressure is 152/93 (abnormal) and her pulse is 100. Her respiration is 16 and oxygen saturation is 98%.  ? ?Reason for encounter: post-procedure evaluation and assessment.   ? ?[name] presents today for  medication management and worsening low back pain.  She recently saw her primary care provider, Dr. Dyke Brackett ?She has an upcoming appointment with neurosurgery ?She has been working with physical therapy and states that is has been increasing her low back pain. I encouraged her to continue with it as it'll help with paraspinal muscle strengthening and range of motion ?Medication management as below. ? ?Pharmacotherapy Assessment  ?Analgesic: Morphine 15 mg twice daily, MME equals 30; Percocet 10 mg twice daily as needed for breakthrough pain,; MME equals 30.  Total MME equals 60   ? ?Monitoring: ?[place] PMP: PDMP reviewed during this encounter.       ?Pharmacotherapy: No side-effects or adverse reactions reported. ?Compliance: No problems identified. ?Effectiveness: Clinically acceptable. ? ?Nonah Mattes, RN  03/26/2022 10:17 AM  Sign when Signing Visit ?Nursing Pain Medication Assessment:  ?Safety precautions to be maintained throughout the outpatient stay will include: orient to surroundings, keep bed in low position, maintain call bell within reach at all times, provide assistance with transfer out of bed and ambulation.  ?Medication Inspection Compliance: Pill count conducted under aseptic conditions, in front of the patient. Neither the pills nor the bottle was removed from the patient's sight at any time. Once count was completed pills were immediately returned to the patient in their original bottle. ? ?Medication #1:  morphine ER ?Pill/Patch Count:  35 of 60 pills remain ?Pill/Patch Appearance: Markings consistent with prescribed medication ?Bottle Appearance: Standard pharmacy container. Clearly labeled. ?Filled Date: 03 / 31 / 2023 ?Last Medication intake:  Today ? ?Medication #2: Oxycodone/APAP ?Pill/Patch Count:  34 of 60 pills remain ?Pill/Patch Appearance: Markings consistent with prescribed medication ?Bottle Appearance: Standard pharmacy container. Clearly labeled. ?Filled Date: 03 / 31 / 2023 ?  Last  Medication intake:  Today ?  ?  UDS:  ?Summary  ?Date Value Ref Range Status  ?03/06/2021 Note  Final  ?  Comment:  ?  ==================================================================== ?[facility] Select 13 (MW) ?==================================================================== ?Test                             Result       Flag       Units ? ?Drug Present and Declared for Prescription Verification ?  Oxycodone                      1031         EXPECTED   ng/mg creat ?  Oxymorphone                    760          EXPECTED   ng/mg creat ?  Noroxycodone                   4697         EXPECTED   ng/mg creat ?  Noroxymorphone                 277          EXPECTED   ng/mg creat ?   Sources of oxycodone are scheduled prescription medications. ?   Oxymorphone, noroxycodone, and noroxymorphone are expected ?   metabolites of oxycodone. Oxymorphone is also available as a ?   scheduled prescription medication. ? ?Drug Present not Declared for Prescription Verification ?  Morphine                       10231        UNEXPECTED ng/mg creat ?  Normorphine                    549          UNEXPECTED ng/mg creat ?   Potential sources of large amounts of morphine in the absence of ?   codeine include administration of morphine or use of heroin. ? ?   Normorphine is an expected metabolite of morphine. ? ?==================================================================== ?Test                      Result    Flag   Units      Ref Range ?  Creatinine              35               mg/dL      >=11 ?==================================================================== ?Declared Medications: ? The flagging and interpretation on this report are based on the ? following declared medications.  Unexpected results may arise from ? inaccuracies in the declared medications. ? ? **Note: The testing scope of this panel includes these medications: ? ? Oxycodone (Percocet) ? ? **Note: The testing scope of this panel does not include the ?  following reported medications: ? ? Acetaminophen (Percocet) ? Buspirone (Buspar) ? Duloxetine (Cymbalta) ? Folic Acid ? Gabapentin (Neurontin) ? Hydroxychloroquine ? Hydroxyzine (Atarax) ? Methotrexate ? Omeprazole (Prilosec) ? Prednisone (Deltasone) ? Triamcinolone (Kenalog) ? Zolpidem (Ambien) ?==================================================================== ?For clinical consultation, please call [phone]. ?==================================================================== ?  ?  ? ?ROS  ?Constitutional: Denies any fever or chills ?Gastrointestinal: No reported hemesis, hematochezia, vomiting, or acute GI distress ?Musculoskeletal:  low back Right  hip pain, right buttock pain. ?Neurological: No reported episodes of acute onset apraxia, aphasia, dysarthria, agnosia, amnesia, paralysis, loss of coordination, or loss of consciousness ? ?Medication Review  ?Cholecalciferol, DULoxetine, buPROPion, folic acid, gabapentin, methotrexate, morphine, omeprazole, oxyCODONE-acetaminophen, and triamcinolone cream ? ?History Review  ?Allergy: Ms. Clydene PughWoodard has No Known Allergies. ?Drug: Ms. Clydene PughWoodard  reports no history of drug use. ?Alcohol:  reports current alcohol use. ?Tobacco:  reports that she has never smoked. She has never used smokeless tobacco. ?Social: Ms. Clydene PughWoodard  reports that she has never smoked. She has never used smokeless tobacco. She reports current alcohol use. She reports that she does not use drugs. ?Medical:  has a past medical history of Anxiety, Arthritis, Depression, and GERD (gastroesophageal reflux disease). ?Surgical: Ms. Clydene PughWoodard  has a past surgical history that includes Laminectomy (1971); Lumbar disc surgery (1991); Tonsillectomy; Cholecystectomy; and Abdominal hysterectomy. ?Family: family history includes Alcohol abuse in her father; Arthritis in her father; Breast cancer (age of onset: 4950) in her mother; Diabetes in her mother; Gout in her father; Rheum arthritis in her  mother. ? ?Laboratory Chemistry Profile  ? ?Renal ?Lab Results  ?Component Value Date  ? BUN 11 05/21/2020  ? CREATININE 0.6 05/21/2020  ?  Hepatic ?Lab Results  ?Component Value Date  ? AST 31 05/21/2020  ? ALT 22 05/21/2020  ? ALBUMI

## 2022-03-26 NOTE — Progress Notes (Signed)
Nursing Pain Medication Assessment:  ?Safety precautions to be maintained throughout the outpatient stay will include: orient to surroundings, keep bed in low position, maintain call bell within reach at all times, provide assistance with transfer out of bed and ambulation.  ?Medication Inspection Compliance: Pill count conducted under aseptic conditions, in front of the patient. Neither the pills nor the bottle was removed from the patient's sight at any time. Once count was completed pills were immediately returned to the patient in their original bottle. ? ?Medication #1:  morphine ER ?Pill/Patch Count:  35 of 60 pills remain ?Pill/Patch Appearance: Markings consistent with prescribed medication ?Bottle Appearance: Standard pharmacy container. Clearly labeled. ?Filled Date: 03 / 31 / 2023 ?Last Medication intake:  Today ? ?Medication #2: Oxycodone/APAP ?Pill/Patch Count:  34 of 60 pills remain ?Pill/Patch Appearance: Markings consistent with prescribed medication ?Bottle Appearance: Standard pharmacy container. Clearly labeled. ?Filled Date: 03 / 31 / 2023 ?Last Medication intake:  Today ?

## 2022-03-28 ENCOUNTER — Other Ambulatory Visit: Payer: Self-pay | Admitting: Family Medicine

## 2022-03-28 ENCOUNTER — Other Ambulatory Visit: Payer: Self-pay | Admitting: Gastroenterology

## 2022-03-28 DIAGNOSIS — F411 Generalized anxiety disorder: Secondary | ICD-10-CM

## 2022-03-30 DIAGNOSIS — M5441 Lumbago with sciatica, right side: Secondary | ICD-10-CM | POA: Diagnosis not present

## 2022-03-30 DIAGNOSIS — G8929 Other chronic pain: Secondary | ICD-10-CM | POA: Diagnosis not present

## 2022-03-30 DIAGNOSIS — M5442 Lumbago with sciatica, left side: Secondary | ICD-10-CM | POA: Diagnosis not present

## 2022-03-31 DIAGNOSIS — G8929 Other chronic pain: Secondary | ICD-10-CM | POA: Diagnosis not present

## 2022-03-31 DIAGNOSIS — M5137 Other intervertebral disc degeneration, lumbosacral region: Secondary | ICD-10-CM | POA: Diagnosis not present

## 2022-03-31 DIAGNOSIS — M5441 Lumbago with sciatica, right side: Secondary | ICD-10-CM | POA: Diagnosis not present

## 2022-03-31 DIAGNOSIS — M8588 Other specified disorders of bone density and structure, other site: Secondary | ICD-10-CM | POA: Diagnosis not present

## 2022-04-02 ENCOUNTER — Other Ambulatory Visit: Payer: Self-pay | Admitting: Student in an Organized Health Care Education/Training Program

## 2022-04-02 ENCOUNTER — Other Ambulatory Visit: Payer: Self-pay | Admitting: Psychiatry

## 2022-04-02 LAB — TOXASSURE SELECT 13 (MW), URINE

## 2022-04-07 ENCOUNTER — Other Ambulatory Visit: Payer: Self-pay | Admitting: Neurosurgery

## 2022-04-07 ENCOUNTER — Other Ambulatory Visit (HOSPITAL_COMMUNITY): Payer: Self-pay | Admitting: Neurosurgery

## 2022-04-07 DIAGNOSIS — G8929 Other chronic pain: Secondary | ICD-10-CM

## 2022-04-10 DIAGNOSIS — M5441 Lumbago with sciatica, right side: Secondary | ICD-10-CM | POA: Diagnosis not present

## 2022-04-10 DIAGNOSIS — M5442 Lumbago with sciatica, left side: Secondary | ICD-10-CM | POA: Diagnosis not present

## 2022-04-10 DIAGNOSIS — G8929 Other chronic pain: Secondary | ICD-10-CM | POA: Diagnosis not present

## 2022-04-12 ENCOUNTER — Ambulatory Visit
Admission: RE | Admit: 2022-04-12 | Discharge: 2022-04-12 | Disposition: A | Discharge status: Home / Self Care | Payer: Medicare PPO | Source: Ambulatory Visit | Attending: Neurosurgery | Admitting: Neurosurgery

## 2022-04-12 DIAGNOSIS — G8929 Other chronic pain: Secondary | ICD-10-CM

## 2022-04-12 DIAGNOSIS — M4126 Other idiopathic scoliosis, lumbar region: Secondary | ICD-10-CM | POA: Diagnosis not present

## 2022-04-12 DIAGNOSIS — M5441 Lumbago with sciatica, right side: Secondary | ICD-10-CM | POA: Insufficient documentation

## 2022-04-12 DIAGNOSIS — M4807 Spinal stenosis, lumbosacral region: Secondary | ICD-10-CM | POA: Diagnosis not present

## 2022-04-12 DIAGNOSIS — M545 Low back pain, unspecified: Secondary | ICD-10-CM | POA: Diagnosis not present

## 2022-04-12 DIAGNOSIS — M48061 Spinal stenosis, lumbar region without neurogenic claudication: Secondary | ICD-10-CM | POA: Diagnosis not present

## 2022-04-21 DIAGNOSIS — H6090 Unspecified otitis externa, unspecified ear: Secondary | ICD-10-CM | POA: Diagnosis not present

## 2022-04-27 ENCOUNTER — Other Ambulatory Visit: Payer: Self-pay | Admitting: Family Medicine

## 2022-04-27 DIAGNOSIS — M810 Age-related osteoporosis without current pathological fracture: Secondary | ICD-10-CM | POA: Diagnosis not present

## 2022-04-27 DIAGNOSIS — F411 Generalized anxiety disorder: Secondary | ICD-10-CM

## 2022-04-28 NOTE — Telephone Encounter (Signed)
I can't refuse this medication the d/c  by another doctor  ?

## 2022-05-04 DIAGNOSIS — G8929 Other chronic pain: Secondary | ICD-10-CM | POA: Diagnosis not present

## 2022-05-04 DIAGNOSIS — M5441 Lumbago with sciatica, right side: Secondary | ICD-10-CM | POA: Diagnosis not present

## 2022-05-04 DIAGNOSIS — M5442 Lumbago with sciatica, left side: Secondary | ICD-10-CM | POA: Diagnosis not present

## 2022-05-05 ENCOUNTER — Ambulatory Visit (INDEPENDENT_AMBULATORY_CARE_PROVIDER_SITE_OTHER): Payer: Medicare PPO | Admitting: Licensed Clinical Social Worker

## 2022-05-05 DIAGNOSIS — F4310 Post-traumatic stress disorder, unspecified: Secondary | ICD-10-CM | POA: Diagnosis not present

## 2022-05-05 NOTE — Plan of Care (Signed)
  Problem: Decrease depressive symptoms and improve levels of effective functioning Goal: LTG: Reduce frequency, intensity, and duration of depression symptoms as evidenced by: pt self report Outcome: Progressing Goal: STG: Emily Landry WILL PARTICIPATE IN AT LEAST 80% OF SCHEDULED INDIVIDUAL PSYCHOTHERAPY SESSIONS Outcome: Progressing Intervention: REVIEW PLEASE SKILLS (TREAT PHYSICAL ILLNESS, BALANCE EATING, AVOID MOOD-ALTERING SUBSTANCES, BALANCE SLEEP AND GET EXERCISE) WITH Emily Landry Note: reviewed   Problem: Reduce overall frequency, intensity, and duration of the anxiety so that daily functioning is not impaired. Goal: LTG: Patient will score less than 5 on the Generalized Anxiety Disorder 7 Scale (GAD-7) Outcome: Progressing Goal: STG: Patient will participate in at least 80% of scheduled individual psychotherapy sessions Outcome: Progressing Intervention: Assist with relaxation techniques, as appropriate (deep breathing exercises, meditation, guided imagery) Description: reviewed Intervention: Monitor coping skills and behavior Description: monitored Intervention: Encourage patient to identify triggers Description: Assisted with identification

## 2022-05-05 NOTE — Progress Notes (Signed)
Virtual Visit via Audio Note  I connected with Emily Landry on 05/05/22 at 10:00 AM EDT by an audio enabled telemedicine application and verified that I am speaking with the correct person using two identifiers.  Location: Patient: home Provider: remote office [place], [place])   I discussed the limitations of evaluation and management by telemedicine and the availability of in person appointments. The patient expressed understanding and agreed to proceed.  I discussed the assessment and treatment plan with the patient. The patient was provided an opportunity to ask questions and all were answered. The patient agreed with the plan and demonstrated an understanding of the instructions.   The patient was advised to call back or seek an in-person evaluation if the symptoms worsen or if the condition fails to improve as anticipated.  I provided 42 minutes of non-face-to-face time during this encounter.   [name] R [name], LCSW   THERAPIST PROGRESS NOTE  Session Time: [id number]  Participation Level: Active  Behavioral Response: NAAlertDepressed  Type of Therapy: Individual Therapy  Treatment Goals addressed: Problem: Decrease depressive symptoms and improve levels of effective functioning Goal: LTG: Reduce frequency, intensity, and duration of depression symptoms as evidenced by: pt self report Outcome: Progressing Goal: STG: [name] WILL PARTICIPATE IN AT LEAST 80% OF SCHEDULED INDIVIDUAL PSYCHOTHERAPY SESSIONS Outcome: Progressing Intervention: REVIEW PLEASE SKILLS (TREAT PHYSICAL ILLNESS, BALANCE EATING, AVOID MOOD-ALTERING SUBSTANCES, BALANCE SLEEP AND GET EXERCISE) WITH Emily Landry Note: reviewed   Problem: Reduce overall frequency, intensity, and duration of the anxiety so that daily functioning is not impaired. Goal: LTG: Patient will score less than 5 on the Generalized Anxiety Disorder 7 Scale (GAD-7) Outcome: Progressing Goal: STG: Patient will participate in at least 80%  of scheduled individual psychotherapy sessions Outcome: Progressing Intervention: Assist with relaxation techniques, as appropriate (deep breathing exercises, meditation, guided imagery) Description: reviewed Intervention: Monitor coping skills and behavior Description: monitored Intervention: Encourage patient to identify triggers Description: Assisted with identification    ProgressTowards Goals: Progressing  Interventions: CBT   Summary: Emily Landry is a 71 y.o. female who presents with improving symptoms related to PTSD and depression. Pt reports overall mood has been stable and that she is managing situational stressors well. 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 .   Allowed pt to explore and express thoughts and feelings associated with recent life situations and external stressors. Pt feels that her back pain has gotten to the point where she needs to do something permanent about it. Pt has consulted with a neurosurgeon and plans to have a surgery date some time in the summer. Pt is hopeful that the surgery will allow her more freedom to engage in recreational activities that she enjoys doing. Discussed pts love for kayaking, hiking, anything outdoors. Encouraged pt to engage in activities that she can physically manage. Pt reports feeling isolated socially at times--encouraged pt to reach out to friends that she has not engaged with in a while. Pt agrees and is hopeful that she will reach out.   Reviewed coping skills to manage depression symptoms and anxiety/stress symptoms.   Continued recommendations are as follows: self care behaviors, positive social engagements, focusing on overall work/home/life balance, and focusing on positive physical and emotional wellness.    Suicidal/Homicidal: No  Therapist Response: Pt is continuing to apply interventions learned in session into daily life situations. Pt  is currently on track to meet goals utilizing interventions mentioned above. Personal growth and progress  noted. Treatment to continue as indicated.   Plan: Return again in 4 weeks.  Diagnosis: PTSD (post-traumatic stress disorder)  Collaboration of Care: Other pt encouraged to continue care with psychiatrist of record, Dr. Vanetta Shawl  Patient/Guardian was advised Release of Information must be obtained prior to any record release in order to collaborate their care with an outside provider. Patient/Guardian was advised if they have not already done so to contact the registration department to sign all necessary forms in order for [place] to release information regarding their care.   Consent: Patient/Guardian gives verbal consent for treatment and assignment of benefits for services provided during this visit. Patient/Guardian expressed understanding and agreed to proceed.   [name] [name], LCSW 05/05/2022

## 2022-05-08 ENCOUNTER — Other Ambulatory Visit: Payer: Self-pay | Admitting: Neurosurgery

## 2022-05-08 DIAGNOSIS — Z01818 Encounter for other preprocedural examination: Secondary | ICD-10-CM

## 2022-05-11 ENCOUNTER — Other Ambulatory Visit: Payer: Self-pay | Admitting: Gastroenterology

## 2022-05-14 ENCOUNTER — Encounter
Admission: RE | Admit: 2022-05-14 | Discharge: 2022-05-14 | Disposition: A | Discharge status: Home / Self Care | Payer: Medicare PPO | Source: Ambulatory Visit | Attending: Neurosurgery | Admitting: Neurosurgery

## 2022-05-14 VITALS — BP 153/80 | HR 80 | Resp 16 | Ht 67.0 in | Wt 212.1 lb

## 2022-05-14 DIAGNOSIS — Z01810 Encounter for preprocedural cardiovascular examination: Secondary | ICD-10-CM | POA: Diagnosis not present

## 2022-05-14 DIAGNOSIS — I10 Essential (primary) hypertension: Secondary | ICD-10-CM | POA: Insufficient documentation

## 2022-05-14 DIAGNOSIS — Z01818 Encounter for other preprocedural examination: Secondary | ICD-10-CM | POA: Diagnosis not present

## 2022-05-14 HISTORY — DX: Reserved for inherently not codable concepts without codable children: IMO0001

## 2022-05-14 HISTORY — DX: Elevated blood-pressure reading, without diagnosis of hypertension: R03.0

## 2022-05-14 HISTORY — DX: Post-traumatic stress disorder, unspecified: F43.10

## 2022-05-14 HISTORY — DX: Procedure and treatment not carried out because of patient's decision for reasons of belief and group pressure: Z53.1

## 2022-05-14 HISTORY — DX: Other specified postprocedural states: Z98.890

## 2022-05-14 HISTORY — DX: Personal history of other diseases of the digestive system: Z87.19

## 2022-05-14 HISTORY — DX: Other chronic pain: G89.29

## 2022-05-14 LAB — BASIC METABOLIC PANEL
Anion gap: 4 — ABNORMAL LOW (ref 5–15)
BUN: 13 mg/dL (ref 8–23)
CO2: 29 mmol/L (ref 22–32)
Calcium: 9 mg/dL (ref 8.9–10.3)
Chloride: 107 mmol/L (ref 98–111)
Creatinine, Ser: 0.5 mg/dL (ref 0.44–1.00)
GFR, Estimated: >60 mL/min (ref >60)
Glucose, Bld: 116 mg/dL — ABNORMAL HIGH (ref 70–99)
Potassium: 3.6 mmol/L (ref 3.5–5.1)
Sodium: 140 mmol/L (ref 135–145)

## 2022-05-14 LAB — SURGICAL PCR SCREEN
MRSA, PCR: NEGATIVE
Staphylococcus aureus: POSITIVE — AB

## 2022-05-14 LAB — URINALYSIS, ROUTINE W REFLEX MICROSCOPIC
Bilirubin Urine: NEGATIVE
Glucose, UA: NEGATIVE mg/dL
Hgb urine dipstick: NEGATIVE
Ketones, ur: NEGATIVE mg/dL
Leukocytes,Ua: NEGATIVE
Nitrite: NEGATIVE
Protein, ur: NEGATIVE mg/dL
Specific Gravity, Urine: 1.012 (ref 1.005–1.030)
pH: 6 (ref 5.0–8.0)

## 2022-05-14 NOTE — Patient Instructions (Addendum)
Your procedure is scheduled on:05-20-22 Wednesday Report to the Registration Desk on the 1st floor of the [facility].Then proceed to the 2nd floor Surgery Desk To find out your arrival time, please call [phone] between 1PM - 3PM on:05-19-22 Tuesday If your arrival time is 6:00 am, do not arrive prior to that time as the [facility] entrance doors do not open until 6:00 am.  REMEMBER: Instructions that are not followed completely may result in serious medical risk, up to and including death; or upon the discretion of your surgeon and anesthesiologist your surgery may need to be rescheduled.  Do not eat food after midnight the night before surgery.  No gum chewing, lozengers or hard candies.  You may however, drink CLEAR liquids up to 2 hours before you are scheduled to arrive for your surgery. Do not drink anything within 2 hours of your scheduled arrival time.  Clear liquids include: - water  - apple juice without pulp - gatorade (not RED colors) - black coffee or tea (Do NOT add milk or creamers to the coffee or tea) Do NOT drink anything that is not on this list  TAKE THESE MEDICATIONS THE MORNING OF SURGERY WITH A SIP OF WATER: -buPROPion (WELLBUTRIN XL) -DULoxetine (CYMBALTA)  -gabapentin (NEURONTIN)  -morphine (MS CONTIN) -omeprazole (PRILOSEC) -take one the night before and one on the morning of surgery - helps to prevent nausea after surgery.)  One week prior to surgery: Stop Anti-inflammatories (NSAIDS) such as Advil, Aleve, Ibuprofen, Motrin, Naproxen, Naprosyn and Aspirin based products such as Excedrin, Goodys Powder, BC Powder.You may however, take Tylenol/Percocet if needed for pain up until the day of surgery.  Stop ANY OVER THE COUNTER supplements/vitamins NOW (05-14-22) until after surgery (Vitamin D3)  No Alcohol for 24 hours before or after surgery.  No Smoking including e-cigarettes for 24 hours prior to surgery.  No chewable tobacco products for at least 6  hours prior to surgery.  No nicotine patches on the day of surgery.  Do not use any "recreational" drugs for at least a week prior to your surgery.  Please be advised that the combination of cocaine and anesthesia may have negative outcomes, up to and including death. If you test positive for cocaine, your surgery will be cancelled.  On the morning of surgery brush your teeth with toothpaste and water, you may rinse your mouth with mouthwash if you wish. Do not swallow any toothpaste or mouthwash.  Use CHG Soap or wipes as directed on instruction sheet.  Do not wear jewelry, make-up, hairpins, clips or nail polish.  Do not wear lotions, powders, or perfumes.   Do not shave body from the neck down 48 hours prior to surgery just in case you cut yourself which could leave a site for infection.  Also, freshly shaved skin may become irritated if using the CHG soap.  Contact lenses, hearing aids and dentures may not be worn into surgery.  Do not bring valuables to the hospital. [facility] is not responsible for any missing/lost belongings or valuables. .   Notify your doctor if there is any change in your medical condition (cold, fever, infection).  Wear comfortable clothing (specific to your surgery type) to the hospital.  After surgery, you can help prevent lung complications by doing breathing exercises.  Take deep breaths and cough every 1-2 hours. Your doctor may order a device called an Incentive Spirometer to help you take deep breaths. When coughing or sneezing, hold a pillow firmly against  your incision with both hands. This is called "splinting." Doing this helps protect your incision. It also decreases belly discomfort.  If you are being admitted to the hospital overnight, leave your suitcase in the car. After surgery it may be brought to your room.  If you are being discharged the day of surgery, you will not be allowed to drive home. You will need a responsible adult (18  years or older) to drive you home and stay with you that night.   If you are taking public transportation, you will need to have a responsible adult (18 years or older) with you. Please confirm with your physician that it is acceptable to use public transportation.   Please call the Pre-admissions Testing Dept. at [phone] if you have any questions about these instructions.  Surgery Visitation Policy:  Patients undergoing a surgery or procedure may have two family members or support persons with them as long as the person is not COVID-19 positive or experiencing its symptoms.   Inpatient Visitation:    Visiting hours are 7 a.m. to 8 p.m. Up to four visitors are allowed at one time in a patient room, including children. The visitors may rotate out with other people during the day. One designated support person (adult) may remain overnight.

## 2022-05-19 MED ORDER — LACTATED RINGERS IV SOLN: INTRAVENOUS | Status: DC

## 2022-05-19 MED ORDER — CHLORHEXIDINE GLUCONATE 0.12 % MT SOLN: 15.0000 mL | Freq: Once | OROMUCOSAL | Status: AC

## 2022-05-19 MED ORDER — CEFAZOLIN SODIUM-DEXTROSE 2-4 GM/100ML-% IV SOLN
2.0000 g | INTRAVENOUS | Status: AC
  Administered 2022-05-20: 2 g via INTRAVENOUS

## 2022-05-19 MED ORDER — ORAL CARE MOUTH RINSE: 15.0000 mL | Freq: Once | OROMUCOSAL | Status: AC

## 2022-05-19 NOTE — Progress Notes (Signed)
Pharmacy Antibiotic Note  Emily Landry is a 70 y.o. female admitted on (Not on file) with surgical prophylaxis.  Pharmacy has been consulted for Cefazolin dosing.  Plan: TBW = 96.2 kg   Cefazolin 2 gm IV X 1 60 min pre-op ordered for 6/7 @ 0500.      No data recorded.  Recent Labs  Lab 05/14/22 1512  CREATININE 0.50    Estimated Creatinine Clearance: 77.9 mL/min (by C-G formula based on SCr of 0.5 mg/dL).    No Known Allergies  Antimicrobials this admission:   >>    >>   Dose adjustments this admission:   Microbiology results:  BCx:   UCx:    Sputum:    MRSA PCR:   Thank you for allowing pharmacy to be a part of this patient's care.  Emmah Bratcher D 05/19/2022 10:12 PM

## 2022-05-20 ENCOUNTER — Other Ambulatory Visit: Payer: Self-pay

## 2022-05-20 ENCOUNTER — Encounter
Admission: RE | Disposition: A | Discharge status: Home / Self Care | Payer: Self-pay | Source: Home / Self Care | Attending: Neurosurgery

## 2022-05-20 ENCOUNTER — Encounter: Payer: Self-pay | Admitting: Neurosurgery

## 2022-05-20 ENCOUNTER — Inpatient Hospital Stay
Admission: RE | Admit: 2022-05-20 | Discharge: 2022-05-21 | DRG: 520 | Disposition: A | Discharge status: Home / Self Care | Payer: Medicare PPO | Attending: Neurosurgery | Admitting: Neurosurgery

## 2022-05-20 ENCOUNTER — Inpatient Hospital Stay: Payer: Medicare PPO

## 2022-05-20 ENCOUNTER — Inpatient Hospital Stay: Payer: Medicare PPO | Admitting: Anesthesiology

## 2022-05-20 ENCOUNTER — Inpatient Hospital Stay: Payer: Medicare PPO | Admitting: Urgent Care

## 2022-05-20 DIAGNOSIS — M4807 Spinal stenosis, lumbosacral region: Secondary | ICD-10-CM | POA: Diagnosis present

## 2022-05-20 DIAGNOSIS — M5416 Radiculopathy, lumbar region: Secondary | ICD-10-CM | POA: Diagnosis not present

## 2022-05-20 DIAGNOSIS — M48062 Spinal stenosis, lumbar region with neurogenic claudication: Principal | ICD-10-CM | POA: Diagnosis present

## 2022-05-20 DIAGNOSIS — Z87891 Personal history of nicotine dependence: Secondary | ICD-10-CM

## 2022-05-20 DIAGNOSIS — Z981 Arthrodesis status: Secondary | ICD-10-CM | POA: Diagnosis not present

## 2022-05-20 DIAGNOSIS — G9741 Accidental puncture or laceration of dura during a procedure: Secondary | ICD-10-CM | POA: Diagnosis not present

## 2022-05-20 DIAGNOSIS — M2428 Disorder of ligament, vertebrae: Secondary | ICD-10-CM | POA: Diagnosis present

## 2022-05-20 DIAGNOSIS — M48061 Spinal stenosis, lumbar region without neurogenic claudication: Principal | ICD-10-CM | POA: Diagnosis present

## 2022-05-20 HISTORY — PX: LUMBAR LAMINECTOMY/DECOMPRESSION MICRODISCECTOMY: SHX5026

## 2022-05-20 LAB — CBC
HCT: 40.3 % (ref 36.0–46.0)
Hemoglobin: 13.4 g/dL (ref 12.0–15.0)
MCH: 30.4 pg (ref 26.0–34.0)
MCHC: 33.3 g/dL (ref 30.0–36.0)
MCV: 91.4 fL (ref 80.0–100.0)
Platelets: 229 10*3/uL (ref 150–400)
RBC: 4.41 MIL/uL (ref 3.87–5.11)
RDW: 13 % (ref 11.5–15.5)
WBC: 5.1 10*3/uL (ref 4.0–10.5)
nRBC: 0 % (ref 0.0–0.2)

## 2022-05-20 LAB — CREATININE, SERUM
Creatinine, Ser: 0.44 mg/dL (ref 0.44–1.00)
GFR, Estimated: >60 mL/min (ref >60)

## 2022-05-20 SURGERY — LUMBAR LAMINECTOMY/DECOMPRESSION MICRODISCECTOMY 4 LEVEL
Anesthesia: General | Site: Spine Lumbar

## 2022-05-20 MED ORDER — ONDANSETRON HCL 4 MG/2ML IJ SOLN: 4.0000 mg | Freq: Four times a day (QID) | INTRAMUSCULAR | Status: DC | PRN

## 2022-05-20 MED ORDER — METHOCARBAMOL 1000 MG/10ML IJ SOLN
500.0000 mg | Freq: Four times a day (QID) | INTRAVENOUS | Status: DC | PRN
  Administered 2022-05-20: 500 mg via INTRAVENOUS
  Filled 2022-05-20: qty 500

## 2022-05-20 MED ORDER — SENNA 8.6 MG PO TABS
1.0000 | ORAL_TABLET | Freq: Two times a day (BID) | ORAL | Status: DC
  Administered 2022-05-20 – 2022-05-21 (×2): 8.6 mg via ORAL
  Filled 2022-05-20 (×2): qty 1

## 2022-05-20 MED ORDER — PHENYLEPHRINE HCL-NACL 20-0.9 MG/250ML-% IV SOLN
INTRAVENOUS | Status: AC
  Filled 2022-05-20: qty 250

## 2022-05-20 MED ORDER — BUPIVACAINE HCL (PF) 0.5 % IJ SOLN
INTRAMUSCULAR | Status: AC
  Filled 2022-05-20: qty 30

## 2022-05-20 MED ORDER — PROPOFOL 1000 MG/100ML IV EMUL
INTRAVENOUS | Status: AC
  Filled 2022-05-20: qty 100

## 2022-05-20 MED ORDER — SODIUM CHLORIDE 0.9% FLUSH
3.0000 mL | Freq: Two times a day (BID) | INTRAVENOUS | Status: DC
  Administered 2022-05-20: 3 mL via INTRAVENOUS

## 2022-05-20 MED ORDER — KETAMINE HCL 10 MG/ML IJ SOLN
INTRAMUSCULAR | Status: DC | PRN
  Administered 2022-05-20: 50 mg via INTRAVENOUS

## 2022-05-20 MED ORDER — ONDANSETRON HCL 4 MG/2ML IJ SOLN
INTRAMUSCULAR | Status: DC | PRN
  Administered 2022-05-20: 4 mg via INTRAVENOUS

## 2022-05-20 MED ORDER — REMIFENTANIL HCL 1 MG IV SOLR
INTRAVENOUS | Status: AC
  Filled 2022-05-20: qty 1000

## 2022-05-20 MED ORDER — POLYETHYLENE GLYCOL 3350 17 G PO PACK: 17.0000 g | PACK | Freq: Every day | ORAL | Status: DC | PRN

## 2022-05-20 MED ORDER — DEXAMETHASONE SODIUM PHOSPHATE 10 MG/ML IJ SOLN
INTRAMUSCULAR | Status: DC | PRN
  Administered 2022-05-20: 10 mg via INTRAVENOUS

## 2022-05-20 MED ORDER — DULOXETINE HCL 30 MG PO CPEP
60.0000 mg | ORAL_CAPSULE | ORAL | Status: DC
  Administered 2022-05-21: 60 mg via ORAL
  Filled 2022-05-20: qty 2

## 2022-05-20 MED ORDER — KETOROLAC TROMETHAMINE 15 MG/ML IJ SOLN
INTRAMUSCULAR | Status: AC
  Administered 2022-05-20: 7.5 mg via INTRAVENOUS
  Filled 2022-05-20: qty 1

## 2022-05-20 MED ORDER — FIBRIN SEALANT 2 ML SINGLE DOSE KIT
PACK | CUTANEOUS | Status: DC | PRN
  Administered 2022-05-20: 2 mL via TOPICAL

## 2022-05-20 MED ORDER — MORPHINE SULFATE ER 15 MG PO TBCR
15.0000 mg | EXTENDED_RELEASE_TABLET | Freq: Two times a day (BID) | ORAL | Status: DC
  Administered 2022-05-20 – 2022-05-21 (×2): 15 mg via ORAL
  Filled 2022-05-20 (×2): qty 1

## 2022-05-20 MED ORDER — SODIUM CHLORIDE (PF) 0.9 % IJ SOLN
INTRAMUSCULAR | Status: DC | PRN
  Administered 2022-05-20: 60 mL via INTRAMUSCULAR

## 2022-05-20 MED ORDER — ACETAMINOPHEN 10 MG/ML IV SOLN: 1000.0000 mg | Freq: Once | INTRAVENOUS | Status: DC | PRN

## 2022-05-20 MED ORDER — OXYCODONE HCL 5 MG/5ML PO SOLN: 5.0000 mg | Freq: Once | ORAL | Status: DC | PRN

## 2022-05-20 MED ORDER — CEFAZOLIN SODIUM-DEXTROSE 2-4 GM/100ML-% IV SOLN
INTRAVENOUS | Status: AC
  Filled 2022-05-20: qty 100

## 2022-05-20 MED ORDER — OXYCODONE HCL 5 MG PO TABS
5.0000 mg | ORAL_TABLET | ORAL | Status: DC | PRN
  Administered 2022-05-21: 5 mg via ORAL
  Filled 2022-05-20: qty 1

## 2022-05-20 MED ORDER — BUPIVACAINE LIPOSOME 1.3 % IJ SUSP
INTRAMUSCULAR | Status: AC
Start: 2022-05-20 — End: ?
  Filled 2022-05-20: qty 20

## 2022-05-20 MED ORDER — FOLIC ACID 1 MG PO TABS
1.0000 mg | ORAL_TABLET | Freq: Every day | ORAL | Status: DC
  Administered 2022-05-21: 1 mg via ORAL
  Filled 2022-05-20: qty 1

## 2022-05-20 MED ORDER — FENTANYL CITRATE (PF) 100 MCG/2ML IJ SOLN
INTRAMUSCULAR | Status: DC | PRN
  Administered 2022-05-20: 100 ug via INTRAVENOUS

## 2022-05-20 MED ORDER — FENTANYL CITRATE (PF) 100 MCG/2ML IJ SOLN: 25.0000 ug | INTRAMUSCULAR | Status: DC | PRN

## 2022-05-20 MED ORDER — SODIUM CHLORIDE 0.9% FLUSH: 3.0000 mL | INTRAVENOUS | Status: DC | PRN

## 2022-05-20 MED ORDER — BUPROPION HCL ER (XL) 150 MG PO TB24
450.0000 mg | ORAL_TABLET | ORAL | Status: DC
  Administered 2022-05-21: 450 mg via ORAL
  Filled 2022-05-20: qty 3

## 2022-05-20 MED ORDER — PHENYLEPHRINE 80 MCG/ML (10ML) SYRINGE FOR IV PUSH (FOR BLOOD PRESSURE SUPPORT)
PREFILLED_SYRINGE | INTRAVENOUS | Status: DC | PRN
  Administered 2022-05-20: 160 ug via INTRAVENOUS

## 2022-05-20 MED ORDER — SURGIFLO WITH THROMBIN (HEMOSTATIC MATRIX KIT) OPTIME
TOPICAL | Status: DC | PRN
  Administered 2022-05-20: 1 via TOPICAL

## 2022-05-20 MED ORDER — PROPOFOL 10 MG/ML IV BOLUS
INTRAVENOUS | Status: DC | PRN
  Administered 2022-05-20: 50 mg via INTRAVENOUS
  Administered 2022-05-20: 150 mg via INTRAVENOUS

## 2022-05-20 MED ORDER — ACETAMINOPHEN 10 MG/ML IV SOLN
INTRAVENOUS | Status: DC | PRN
  Administered 2022-05-20: 1000 mg via INTRAVENOUS

## 2022-05-20 MED ORDER — MIDAZOLAM HCL 2 MG/2ML IJ SOLN
INTRAMUSCULAR | Status: DC | PRN
  Administered 2022-05-20: 2 mg via INTRAVENOUS

## 2022-05-20 MED ORDER — SUCCINYLCHOLINE CHLORIDE 200 MG/10ML IV SOSY
PREFILLED_SYRINGE | INTRAVENOUS | Status: DC | PRN
  Administered 2022-05-20: 100 mg via INTRAVENOUS

## 2022-05-20 MED ORDER — CHLORHEXIDINE GLUCONATE 0.12 % MT SOLN
OROMUCOSAL | Status: AC
  Administered 2022-05-20: 15 mL via OROMUCOSAL
  Filled 2022-05-20: qty 15

## 2022-05-20 MED ORDER — OXYCODONE HCL 5 MG PO TABS
10.0000 mg | ORAL_TABLET | ORAL | Status: DC | PRN
  Administered 2022-05-20: 10 mg via ORAL
  Filled 2022-05-20: qty 2

## 2022-05-20 MED ORDER — FENTANYL CITRATE (PF) 100 MCG/2ML IJ SOLN
INTRAMUSCULAR | Status: AC
  Filled 2022-05-20: qty 2

## 2022-05-20 MED ORDER — GABAPENTIN 400 MG PO CAPS
400.0000 mg | ORAL_CAPSULE | ORAL | Status: DC
  Administered 2022-05-21: 400 mg via ORAL
  Filled 2022-05-20: qty 1

## 2022-05-20 MED ORDER — OXYCODONE HCL 5 MG PO TABS: 5.0000 mg | ORAL_TABLET | Freq: Once | ORAL | Status: DC | PRN

## 2022-05-20 MED ORDER — BUPIVACAINE-EPINEPHRINE (PF) 0.5% -1:200000 IJ SOLN
INTRAMUSCULAR | Status: AC
  Filled 2022-05-20: qty 30

## 2022-05-20 MED ORDER — MENTHOL 3 MG MT LOZG: 1.0000 | LOZENGE | OROMUCOSAL | Status: DC | PRN

## 2022-05-20 MED ORDER — PROPOFOL 500 MG/50ML IV EMUL
INTRAVENOUS | Status: DC | PRN
  Administered 2022-05-20: 150 ug/kg/min via INTRAVENOUS

## 2022-05-20 MED ORDER — PHENYLEPHRINE HCL-NACL 20-0.9 MG/250ML-% IV SOLN
INTRAVENOUS | Status: DC | PRN
  Administered 2022-05-20: 50 ug/min via INTRAVENOUS

## 2022-05-20 MED ORDER — METHOCARBAMOL 500 MG PO TABS
500.0000 mg | ORAL_TABLET | Freq: Four times a day (QID) | ORAL | Status: DC | PRN
  Administered 2022-05-21: 500 mg via ORAL
  Filled 2022-05-20: qty 1

## 2022-05-20 MED ORDER — PHENOL 1.4 % MT LIQD: 1.0000 | OROMUCOSAL | Status: DC | PRN

## 2022-05-20 MED ORDER — ACETAMINOPHEN 10 MG/ML IV SOLN
INTRAVENOUS | Status: AC
  Filled 2022-05-20: qty 100

## 2022-05-20 MED ORDER — KETAMINE HCL 50 MG/5ML IJ SOSY
PREFILLED_SYRINGE | INTRAMUSCULAR | Status: AC
  Filled 2022-05-20: qty 5

## 2022-05-20 MED ORDER — KETOROLAC TROMETHAMINE 15 MG/ML IJ SOLN
7.5000 mg | Freq: Four times a day (QID) | INTRAMUSCULAR | Status: AC
  Administered 2022-05-20 – 2022-05-21 (×3): 7.5 mg via INTRAVENOUS
  Filled 2022-05-20 (×3): qty 1

## 2022-05-20 MED ORDER — METHYLPREDNISOLONE ACETATE 40 MG/ML IJ SUSP
INTRAMUSCULAR | Status: AC
  Filled 2022-05-20: qty 1

## 2022-05-20 MED ORDER — PANTOPRAZOLE SODIUM 40 MG PO TBEC
80.0000 mg | DELAYED_RELEASE_TABLET | Freq: Every day | ORAL | Status: DC
  Administered 2022-05-21: 80 mg via ORAL
  Filled 2022-05-20: qty 2

## 2022-05-20 MED ORDER — BISACODYL 10 MG RE SUPP: 10.0000 mg | Freq: Every day | RECTAL | Status: DC | PRN

## 2022-05-20 MED ORDER — 0.9 % SODIUM CHLORIDE (POUR BTL) OPTIME
TOPICAL | Status: DC | PRN
  Administered 2022-05-20: 1000 mL

## 2022-05-20 MED ORDER — REMIFENTANIL HCL 1 MG IV SOLR
INTRAVENOUS | Status: DC | PRN
  Administered 2022-05-20: .1 ug/kg/min via INTRAVENOUS

## 2022-05-20 MED ORDER — MORPHINE SULFATE (PF) 2 MG/ML IV SOLN
1.0000 mg | INTRAVENOUS | Status: DC | PRN
  Administered 2022-05-20 (×2): 1 mg via INTRAVENOUS
  Filled 2022-05-20 (×2): qty 1

## 2022-05-20 MED ORDER — ENOXAPARIN SODIUM 40 MG/0.4ML IJ SOSY
40.0000 mg | PREFILLED_SYRINGE | INTRAMUSCULAR | Status: DC
  Administered 2022-05-21: 40 mg via SUBCUTANEOUS
  Filled 2022-05-20: qty 0.4

## 2022-05-20 MED ORDER — METHYLPREDNISOLONE ACETATE 40 MG/ML IJ SUSP
INTRAMUSCULAR | Status: DC | PRN
  Administered 2022-05-20: 40 mg

## 2022-05-20 MED ORDER — MIDAZOLAM HCL 2 MG/2ML IJ SOLN
INTRAMUSCULAR | Status: AC
  Filled 2022-05-20: qty 2

## 2022-05-20 MED ORDER — LIDOCAINE HCL (CARDIAC) PF 100 MG/5ML IV SOSY
PREFILLED_SYRINGE | INTRAVENOUS | Status: DC | PRN
  Administered 2022-05-20: 80 mg via INTRAVENOUS

## 2022-05-20 MED ORDER — ACETAMINOPHEN 500 MG PO TABS
1000.0000 mg | ORAL_TABLET | Freq: Four times a day (QID) | ORAL | Status: AC
  Administered 2022-05-21 (×3): 1000 mg via ORAL
  Filled 2022-05-20 (×5): qty 2

## 2022-05-20 MED ORDER — DOCUSATE SODIUM 100 MG PO CAPS
100.0000 mg | ORAL_CAPSULE | Freq: Two times a day (BID) | ORAL | Status: DC
  Administered 2022-05-20 – 2022-05-21 (×2): 100 mg via ORAL
  Filled 2022-05-20 (×2): qty 1

## 2022-05-20 MED ORDER — ONDANSETRON HCL 4 MG PO TABS
4.0000 mg | ORAL_TABLET | Freq: Four times a day (QID) | ORAL | Status: DC | PRN
Start: 2022-05-20 — End: 2022-05-21

## 2022-05-20 MED ORDER — SODIUM CHLORIDE 0.9 % IV SOLN: 250.0000 mL | INTRAVENOUS | Status: DC

## 2022-05-20 MED ORDER — HYDROMORPHONE HCL 1 MG/ML IJ SOLN: 0.5000 mg | INTRAMUSCULAR | Status: DC | PRN

## 2022-05-20 MED ORDER — SODIUM CHLORIDE FLUSH 0.9 % IV SOLN
INTRAVENOUS | Status: AC
  Filled 2022-05-20: qty 20

## 2022-05-20 MED ORDER — BUPIVACAINE-EPINEPHRINE (PF) 0.5% -1:200000 IJ SOLN
INTRAMUSCULAR | Status: DC | PRN
  Administered 2022-05-20: 10 mL

## 2022-05-20 MED ORDER — SODIUM CHLORIDE 0.9 % IV SOLN: INTRAVENOUS | Status: DC

## 2022-05-20 MED ORDER — ONDANSETRON HCL 4 MG/2ML IJ SOLN: 4.0000 mg | Freq: Once | INTRAMUSCULAR | Status: DC | PRN

## 2022-05-20 SURGICAL SUPPLY — 63 items
BASIN KIT SINGLE STR (MISCELLANEOUS) ×2 IMPLANT
BUR NEURO DRILL SOFT 3.0X3.8M (BURR) ×2 IMPLANT
CHLORAPREP W/TINT 26 (MISCELLANEOUS) ×4 IMPLANT
CNTNR SPEC 2.5X3XGRAD LEK (MISCELLANEOUS) ×1
CONT SPEC 4OZ STER OR WHT (MISCELLANEOUS) ×1
CONT SPEC 4OZ STRL OR WHT (MISCELLANEOUS) ×1
CONTAINER SPEC 2.5X3XGRAD LEK (MISCELLANEOUS) ×1 IMPLANT
COUNTER NEEDLE 20/40 LG (NEEDLE) ×2 IMPLANT
CUP MEDICINE 2OZ PLAST GRAD ST (MISCELLANEOUS) ×4 IMPLANT
DERMABOND ADVANCED (GAUZE/BANDAGES/DRESSINGS) ×1
DERMABOND ADVANCED .7 DNX12 (GAUZE/BANDAGES/DRESSINGS) ×1 IMPLANT
DRAPE C ARM PK CFD 31 SPINE (DRAPES) ×2 IMPLANT
DRAPE LAPAROTOMY 100X77 ABD (DRAPES) ×2 IMPLANT
DRAPE MICROSCOPE SPINE 48X150 (DRAPES) ×2 IMPLANT
DRAPE SURG 17X11 SM STRL (DRAPES) ×2 IMPLANT
DRSG OPSITE POSTOP 4X6 (GAUZE/BANDAGES/DRESSINGS) ×1 IMPLANT
DRSG TEGADERM 4X4.75 (GAUZE/BANDAGES/DRESSINGS) ×1 IMPLANT
ELECT CAUTERY BLADE TIP 2.5 (TIP) ×2
ELECT EZSTD 165MM 6.5IN (MISCELLANEOUS) ×2
ELECTRODE CAUTERY BLDE TIP 2.5 (TIP) ×1 IMPLANT
ELECTRODE EZSTD 165MM 6.5IN (MISCELLANEOUS) ×1 IMPLANT
GLOVE BIOGEL PI IND STRL 6.5 (GLOVE) ×1 IMPLANT
GLOVE BIOGEL PI IND STRL 8.5 (GLOVE) ×1 IMPLANT
GLOVE BIOGEL PI INDICATOR 6.5 (GLOVE) ×1
GLOVE BIOGEL PI INDICATOR 8.5 (GLOVE) ×1
GLOVE SURG SYN 6.5 ES PF (GLOVE) ×2 IMPLANT
GLOVE SURG SYN 6.5 PF PI (GLOVE) ×1 IMPLANT
GLOVE SURG SYN 8.5  E (GLOVE) ×6
GLOVE SURG SYN 8.5 E (GLOVE) ×3 IMPLANT
GLOVE SURG SYN 8.5 PF PI (GLOVE) ×3 IMPLANT
GOWN SRG LRG LVL 4 IMPRV REINF (GOWNS) ×1 IMPLANT
GOWN SRG XL LVL 3 NONREINFORCE (GOWNS) ×1 IMPLANT
GOWN STRL NON-REIN TWL XL LVL3 (GOWNS) ×2
GOWN STRL REIN LRG LVL4 (GOWNS) ×2
GOWN STRL REUS W/ TWL XL LVL3 (GOWN DISPOSABLE) ×1 IMPLANT
GOWN STRL REUS W/TWL XL LVL3 (GOWN DISPOSABLE) ×2
GRADUATE 1200CC STRL 31836 (MISCELLANEOUS) ×2 IMPLANT
GRAFT DURAGEN MATRIX 1WX1L (Tissue) ×1 IMPLANT
HOLDER FOLEY CATH W/STRAP (MISCELLANEOUS) ×1 IMPLANT
KIT SPINAL PRONEVIEW (KITS) ×2 IMPLANT
MANIFOLD NEPTUNE II (INSTRUMENTS) ×2 IMPLANT
MARKER SKIN DUAL TIP RULER LAB (MISCELLANEOUS) ×2 IMPLANT
NDL SAFETY ECLIPSE 18X1.5 (NEEDLE) IMPLANT
NEEDLE HYPO 18GX1.5 SHARP (NEEDLE)
NEEDLE HYPO 22GX1.5 SAFETY (NEEDLE) ×2 IMPLANT
NS IRRIG 1000ML POUR BTL (IV SOLUTION) ×2 IMPLANT
PACK LAMINECTOMY NEURO (CUSTOM PROCEDURE TRAY) ×2 IMPLANT
PAD ARMBOARD 7.5X6 YLW CONV (MISCELLANEOUS) ×2 IMPLANT
SURGIFLO W/THROMBIN 8M KIT (HEMOSTASIS) ×2 IMPLANT
SUT DVC VLOC 3-0 CL 6 P-12 (SUTURE) ×2 IMPLANT
SUT ETHILON 3-0 FS-10 30 BLK (SUTURE) ×2
SUT VIC AB 0 CT1 27 (SUTURE) ×4
SUT VIC AB 0 CT1 27XCR 8 STRN (SUTURE) ×1 IMPLANT
SUT VIC AB 2-0 CT1 18 (SUTURE) ×3 IMPLANT
SUTURE EHLN 3-0 FS-10 30 BLK (SUTURE) IMPLANT
SYR 20ML LL LF (SYRINGE) ×2 IMPLANT
SYR 30ML LL (SYRINGE) ×4 IMPLANT
SYR 3ML LL SCALE MARK (SYRINGE) ×2 IMPLANT
TOWEL OR 17X26 4PK STRL BLUE (TOWEL DISPOSABLE) ×6 IMPLANT
TRAY FOL W/BAG SLVR 16FR STRL (SET/KITS/TRAYS/PACK) IMPLANT
TRAY FOLEY W/BAG SLVR 16FR LF (SET/KITS/TRAYS/PACK) ×2
TUBING CONNECTING 10 (TUBING) ×2 IMPLANT
WATER STERILE IRR 1000ML POUR (IV SOLUTION) ×1 IMPLANT

## 2022-05-20 NOTE — Anesthesia Procedure Notes (Signed)
Procedure Name: Intubation Date/Time: 05/20/2022 10:34 AM Performed by: Aline Brochure, CRNA Pre-anesthesia Checklist: Patient identified, Patient being monitored, Timeout performed, Emergency Drugs available and Suction available Patient Re-evaluated:Patient Re-evaluated prior to induction Oxygen Delivery Method: Circle system utilized Preoxygenation: Pre-oxygenation with 100% oxygen Induction Type: IV induction Ventilation: Mask ventilation without difficulty Laryngoscope Size: 3 and McGraph Grade View: Grade I Tube type: Oral Tube size: 6.5 mm Number of attempts: 1 Airway Equipment and Method: Stylet and Video-laryngoscopy Placement Confirmation: ETT inserted through vocal cords under direct vision, positive ETCO2 and breath sounds checked- equal and bilateral Secured at: 21 cm Tube secured with: Tape Dental Injury: Teeth and Oropharynx as per pre-operative assessment

## 2022-05-20 NOTE — Transfer of Care (Signed)
Immediate Anesthesia Transfer of Care Note  Patient: Emily Landry  Procedure(s) Performed: L2-3 AND L4-5 DECOMPRESSION, RIGHT L3-4 AND L5-S1 MICRODISCECTOMIES (Spine Lumbar)  Patient Location: PACU  Anesthesia Type:General  Level of Consciousness: drowsy  Airway & Oxygen Therapy: Patient Spontanous Breathing and Patient connected to face mask oxygen  Post-op Assessment: Report given to RN and Post -op Vital signs reviewed and stable  Post vital signs: Reviewed and stable  Last Vitals:  Vitals Value Taken Time  BP 137/68   Temp    Pulse 80 05/20/22 1415  Resp 14 05/20/22 1415  SpO2 99 % 05/20/22 1415  Vitals shown include unvalidated device data.  Last Pain:  Vitals:   05/20/22 0946  TempSrc: Temporal  PainSc: 0-No pain         Complications: No notable events documented.

## 2022-05-20 NOTE — Op Note (Addendum)
Indications: Emily Landry is a 71 yo female who presented with: Neurogenic claudication due to lumbar spinal stenosis M48.062, Lumbar radiculopathy M54.16  She failed conservative management prompting surgical intervention.  Findings: extensive epidural scarring at L4-S1, stenosis  Preoperative Diagnosis: Neurogenic claudication due to lumbar spinal stenosis M48.062, Lumbar radiculopathy M54.16 Postoperative Diagnosis: same   EBL: 200 ml IVF: see AR ml Drains: none Disposition: Extubated and Stable to PACU Complications: none  A foley catheter was placed.   Preoperative Note:   Risks of surgery discussed include: infection, bleeding, stroke, coma, death, paralysis, CSF leak, nerve/spinal cord injury, numbness, tingling, weakness, complex regional pain syndrome, recurrent stenosis and/or disc herniation, vascular injury, development of instability, neck/back pain, need for further surgery, persistent symptoms, development of deformity, and the risks of anesthesia. The patient understood these risks and agreed to proceed.  Operative Note:   1) Right L5/S1 and L3/4 microdiscectomy 2) L2-3 and L4-5 lumbar decompression  The patient was then brought from the preoperative center with intravenous access established.  The patient underwent general anesthesia and endotracheal tube intubation, and was then rotated on the [place] rail top where all pressure points were appropriately padded.  The skin was then thoroughly cleansed.  Perioperative antibiotic prophylaxis was administered.  Sterile prep and drapes were then applied and a timeout was then observed.  C-arm was brought into the field under sterile conditions, and the L5-S1 disc space identified and marked with an incision in the midline midline.  Once this was complete a 2 cm incision was opened with the use of a #10 blade knife.  The Metrx tubes were sequentially advanced under lateral fluoroscopy until a 18 x 60 mm Metrx tube was placed  over the right L5/S1 facet and lamina and secured to the bed.    The microscope was then sterilely brought into the field and muscle creep was hemostased with a bipolar and resected with a pituitary rongeur.  A Bovie extender was then used to expose the spinous process and lamina.  Careful attention was placed to not violate the facet capsule. A 3 mm matchstick drill bit was then used to make a hemi-laminotomy trough until the underlying dura was exposed.  There was no appreciable ligamentumThis was extended to the base of the spinous process.  Once this was complete and the underlying ligamentum flavum was visualized this was dissected with an up angle curette and resected with a #2 and #3 mm biting Kerrison.  The laminotomy opening was also expanded in similar fashion and hemostasis was obtained with Surgifoam and a patty as well as bone wax.  The rostral aspect of the caudal level of the lamina was also resected with a #2 biting Kerrison effort to further enhance exposure.  Once the underlying dura was visualized a Penfield 4 was then used to dissect and expose the traversing nerve root.  Once this was identified a nerve root retractor suction was used to mobilize this medially.  The venous plexus was hemostased with Surgifoam and light bipolar use.  A small penfied was then used to make a small annulotomy within the disc space and disc space contents were noted to come through the annulus.    The disc herniation was identified and dissected free using a balltip probe. The pituitary rongeur was used to remove the extruded disc fragments. Once the thecal sac and nerve root were noted to be relaxed and under less tension the ball-tipped feeler was passed along the foramen distally to to ensure no  residual compression was noted.    Depo-Medrol was placed along the nerve root.  The area was irrigated. The tube system was then removed under microscopic visualization and hemostasis was obtained with a bipolar.     After performing the decompression at L5-S1, the metrx tubes were sequentially advanced and confirmed in position at L4-5. An 18mm by 60mm tube was locked in place to the bed side attachment.  Fluoroscopy was then removed from the field.  The microscope was then sterilely brought into the field and muscle creep was hemostased with a bipolar and resected with a pituitary rongeur.  A Bovie extender was then used to expose the spinous process and lamina.  Careful attention was placed to not violate the facet capsule. A 3 mm matchstick drill bit was then used to make a hemi-laminotomy trough until the ligamentum flavum was exposed.  This was extended to the base of the spinous process and to the contralateral side to remove all the central bone from each side.  Once this was complete and the underlying ligamentum flavum was visualized, it was dissected with a curette and resected with Kerrison rongeurs.  The ligamentum was scarred to the dura in the midline, causing a small durotomy.  This was protected throughout decompression. Extensive ligamentum hypertrophy was noted, requiring a substantial amount of time and care for removal.  The dura was identified and palpated. The kerrison rongeur was then used to remove the medial facet bilaterally until no compression was noted.  A balltip probe was used to confirm decompression of the ipsilateral L5 nerve root.  L4-5 central decompression including medial facetectomy and foraminotomy was confirmed and decompression on both sides was confirmed. Duragen and vistaseel were used to augment closure.  The wound was copiously irrigated. The tube system was then removed under microscopic visualization and hemostasis was obtained with a bipolar.    After performing the decompression at L4-5, the metrx tubes were sequentially advanced and confirmed in position at L3-4. An 18mm by 60mm tube was locked in place to the bed side attachment.  Fluoroscopy was then removed from  the field.  The microscope was then sterilely brought into the field and muscle creep was hemostased with a bipolar and resected with a pituitary rongeur.  A Bovie extender was then used to expose the spinous process and lamina.  Careful attention was placed to not violate the facet capsule. A 3 mm matchstick drill bit was then used to make a hemi-laminotomy trough until the ligamentum flavum was exposed.  This was extended to the base of the spinous process and to the contralateral side to remove all the central bone from each side.  Once this was complete and the underlying ligamentum flavum was visualized, it was dissected with a curette and resected with Kerrison rongeurs.  Extensive ligamentum hypertrophy was noted, requiring a substantial amount of time and care for removal.  The dura was identified and palpated. The kerrison rongeur was then used to remove the medial facet bilaterally until no compression was noted.  A balltip probe was used to confirm decompression of the ipsilateral L4 nerve root.  Additional attention was paid to completion of the contralateral foraminotomy until the contralateral L4 nerve root was completely free.  Once this was complete, L3-4 central decompression including medial facetectomy and foraminotomy was confirmed and decompression on both sides was confirmed. No CSF leak was noted.  A Depo-Medrol soaked Gelfoam pledget was placed in the defect.  The wound was copiously irrigated. The  tube system was then removed under microscopic visualization and hemostasis was obtained with a bipolar.   After performing the decompression at L3-4, the metrx tubes were sequentially advanced and confirmed in position at L2-3. An 18mm by 60mm tube was locked in place to the bed side attachment.  Fluoroscopy was then removed from the field.  The microscope was then sterilely brought into the field and muscle creep was hemostased with a bipolar and resected with a pituitary rongeur.  A Bovie  extender was then used to expose the spinous process and lamina.  Careful attention was placed to not violate the facet capsule. A 3 mm matchstick drill bit was then used to make a hemi-laminotomy trough until the ligamentum flavum was exposed.  This was extended to the base of the spinous process and to the contralateral side to remove all the central bone from each side.  Once this was complete and the underlying ligamentum flavum was visualized, it was dissected with a curette and resected with Kerrison rongeurs.  Extensive ligamentum hypertrophy was noted, requiring a substantial amount of time and care for removal.  The dura was identified and palpated. The kerrison rongeur was then used to remove the medial facet bilaterally until no compression was noted.  A balltip probe was used to confirm decompression of the ipsilateral L3 nerve root.  Additional attention was paid to completion of the contralateral foraminotomy until the contralateral L3 nerve root was completely free.  Once this was complete, L2-3 central decompression including medial facetectomy and foraminotomy was confirmed and decompression on both sides was confirmed. No CSF leak was noted.  A Depo-Medrol soaked Gelfoam pledget was placed in the defect.  The wound was copiously irrigated. The tube system was then removed under microscopic visualization and hemostasis was obtained with a bipolar.     The fascial layer was reapproximated with the use of 0- Vicryl suture.  Subcutaneous tissue layer was reapproximated using 2-0 Vicryl suture.  3-0 nylon was used on the skin. The skin was then cleansed and a dressing placed.  Patient was then rotated back to the preoperative bed awakened from anesthesia and taken to recovery all counts are correct in this case.   I performed the entire procedure with the assistance of Manning Charityanielle Koch PA as an Designer, television/film setassistant surgeon.  Venetia Nighthester Kamir Selover MD

## 2022-05-20 NOTE — H&P (Signed)
I have reviewed and confirmed my history and physical from 04/24/22 with no additions or changes. Plan for L2-3 and L4-5 decompression, L3-4 and L5-S1 discectomy on the right.  Risks and benefits reviewed.  Heart sounds normal no MRG. Chest Clear to Auscultation Bilaterally.

## 2022-05-20 NOTE — Anesthesia Preprocedure Evaluation (Addendum)
Anesthesia Evaluation  Patient identified by MRN, date of birth, ID band Patient awake    Reviewed: Allergy & Precautions, NPO status , Patient's Chart, lab work & pertinent test results  History of Anesthesia Complications (+) PONVNegative for: history of anesthetic complications  Airway Mallampati: II  TM Distance: >3 FB Neck ROM: Full    Dental no notable dental hx. (+) Teeth Intact   Pulmonary neg pulmonary ROS, neg sleep apnea, neg COPD, Patient abstained from smoking.Not current smoker, former smoker,    Pulmonary exam normal breath sounds clear to auscultation       Cardiovascular Exercise Tolerance: Good METShypertension, Pt. on medications (-) CAD and (-) Past MI (-) dysrhythmias  Rhythm:Regular Rate:Normal - Systolic murmurs    Neuro/Psych PSYCHIATRIC DISORDERS Anxiety Depression negative neurological ROS     GI/Hepatic hiatal hernia, GERD  Medicated and Controlled,(+)     (-) substance abuse  ,   Endo/Other  neg diabetes  Renal/GU negative Renal ROS     Musculoskeletal  (+) Arthritis , On 30mg  MS Contin and 20mg  oxycodone daily   Abdominal   Peds  Hematology  (+) REFUSES BLOOD PRODUCTS, JEHOVAH'S WITNESSHgb 13.9 from 02/2022 Patient is Jehova's Witness, refuses blood products (PRBC, platelet, plasma) even if it meant withholding those products would lead to her death.  She is willing to accept Cell Saver   Anesthesia Other Findings Past Medical History: No date: Anxiety No date: Arthritis No date: Chronic back pain No date: Depression No date: Elevated blood pressure reading No date: GERD (gastroesophageal reflux disease) No date: History of hiatal hernia No date: PONV (postoperative nausea and vomiting)     Comment:  x 1 with Hysterectomy No date: PTSD (post-traumatic stress disorder) No date: Refusal of blood transfusions as patient is Jehovah's Witness  Reproductive/Obstetrics                             Anesthesia Physical Anesthesia Plan  ASA: 2  Anesthesia Plan: General   Post-op Pain Management: Ofirmev IV (intra-op)*, Ketamine IV* and Dilaudid IV   Induction: Intravenous  PONV Risk Score and Plan: 4 or greater and Ondansetron, Dexamethasone, Midazolam, Treatment may vary due to age or medical condition, TIVA and Propofol infusion  Airway Management Planned: Oral ETT  Additional Equipment: None  Intra-op Plan:   Post-operative Plan: Extubation in OR  Informed Consent: I have reviewed the patients History and Physical, chart, labs and discussed the procedure including the risks, benefits and alternatives for the proposed anesthesia with the patient or authorized representative who has indicated his/her understanding and acceptance.     Dental advisory given  Plan Discussed with: CRNA and Surgeon  Anesthesia Plan Comments: (Discussed risks of anesthesia with patient, including PONV, sore throat, lip/dental/eye damage. Rare risks discussed as well, such as cardiorespiratory and neurological sequelae, and allergic reactions. Discussed the role of CRNA in patient's perioperative care. Patient understands.)        Anesthesia Quick Evaluation

## 2022-05-21 ENCOUNTER — Encounter: Payer: Self-pay | Admitting: Neurosurgery

## 2022-05-21 MED ORDER — OXYCODONE HCL 5 MG PO TABS
5.0000 mg | ORAL_TABLET | ORAL | 0 refills | Status: AC | PRN
Start: 2022-05-21 — End: 2022-05-26

## 2022-05-21 MED ORDER — METHOCARBAMOL 500 MG PO TABS: 500.0000 mg | ORAL_TABLET | Freq: Four times a day (QID) | ORAL | 0 refills | Status: DC | PRN

## 2022-05-21 MED ORDER — SENNA 8.6 MG PO TABS: 1.0000 | ORAL_TABLET | Freq: Every day | ORAL | 0 refills | Status: DC | PRN

## 2022-05-21 NOTE — Progress Notes (Signed)
16 Foley cath dc per order at this time.patient tolerated procedure w/o any c/o.10cc of NS was removed  from the balloon.catheter tip intact. patient encouraged to consume fluids and educated that she needs to void within 6 hours of foley cath removal per protocol..Pt did ambulated in the room with assistance for about 12 feet using the front wheel walker.no verbal c/o at this time.CL is within reach.

## 2022-05-21 NOTE — Anesthesia Postprocedure Evaluation (Signed)
Anesthesia Post Note  Patient: Emily Landry  Procedure(s) Performed: L2-3 AND L4-5 DECOMPRESSION, RIGHT L3-4 AND L5-S1 MICRODISCECTOMIES (Spine Lumbar)  Patient location during evaluation: PACU Anesthesia Type: General Level of consciousness: awake and alert Pain management: pain level controlled Vital Signs Assessment: post-procedure vital signs reviewed and stable Respiratory status: spontaneous breathing, nonlabored ventilation, respiratory function stable and patient connected to nasal cannula oxygen Cardiovascular status: blood pressure returned to baseline and stable Postop Assessment: no apparent nausea or vomiting Anesthetic complications: no   No notable events documented.   Last Vitals:  Vitals:   05/20/22 1537 05/21/22 0329  BP: (!) 154/92 138/68  Pulse: 90 75  Resp: 18 15  Temp: 36.6 C 36.7 C  SpO2: 97% 95%    Last Pain:  Vitals:   05/21/22 0600  TempSrc:   PainSc: 6                  Arita Miss

## 2022-05-21 NOTE — Evaluation (Signed)
Occupational Therapy Evaluation Patient Details Name: Emily Landry MRN: [id number] DOB: 1951/10/08 Today's Date: 05/21/2022   History of Present Illness Pt is a 71 y/o F s/p lumbar surgery (L2-3 and L4-5 decompression, R L3-4 and L5-S1 microdiscectomies).   Clinical Impression   Pt was seen for OT evaluation this date. Prior to hospital admission, pt was independent with mobility and all ADLs. Pt lives with husband in two level home with 3 steps to enter, but reports able to live on main level with bedroom/bathroom. Pt reports providing care for husband at home, but another family member could assist if needed. Pt reported 4/10 pain at start of session. Pt presents to acute OT demonstrating impaired ADL performance and functional mobility 2/2 lumbar surgery - functional strength/ROM limitations. Pt currently requires SUPERVISION with min vcs to maintain pcns for donning/doffing underpants/pants and socks and during grooming tasks (brushing teeth and washing hands). CGA w/ no AD and min vcs to maintain pcns for toilet t/f. SUPERVISION for bed mobility w/ min vcs to perform log roll and maintain spinal pcns. Pt educated on log roll and strategies to maintaining precautions during functional ADLs. All education complete, will sign off. Upon hospital discharge, recommend no OT follow up.      Recommendations for follow up therapy are one component of a multi-disciplinary discharge planning process, led by the attending physician.  Recommendations may be updated based on patient status, additional functional criteria and insurance authorization.   Follow Up Recommendations  No OT follow up    Assistance Recommended at Discharge Set up Supervision/Assistance  Patient can return home with the following Assistance with cooking/housework;Assist for transportation;A little help with bathing/dressing/bathroom    Functional Status Assessment  Patient has had a recent decline in their functional status and  demonstrates the ability to make significant improvements in function in a reasonable and predictable amount of time.  Equipment Recommendations  None recommended by OT    Recommendations for Other Services       Precautions / Restrictions Precautions Precautions: Back;Fall Precaution Comments: Spinal pcns, No brace requried Restrictions Weight Bearing Restrictions: No      Mobility Bed Mobility Overal bed mobility: Needs Assistance Bed Mobility: Supine to Sit     Supine to sit: Supervision     General bed mobility comments: Pt required min vcs to perform log roll and maintain spinal pcns    Transfers Overall transfer level: Needs assistance Equipment used: None Transfers: Sit to/from Stand Sit to Stand: Supervision                  Balance Overall balance assessment: Needs assistance Sitting-balance support: Feet supported, No upper extremity supported Sitting balance-Leahy Scale: Good     Standing balance support: No upper extremity supported, During functional activity Standing balance-Leahy Scale: Good Standing balance comment: Occasionally would reach out for counter surface, but observed LOB                           ADL either performed or assessed with clinical judgement   ADL Overall ADL's : Needs assistance/impaired                                       General ADL Comments: SUPERVISION with min vcs to maintain pcns for donning/doffing underpants/pants and socks. CGA w/ no AD and min vcs to maintain pcns for toilet  t/f. SUPERVISION with min vcs to maintain pcns during grooming tasks (brushing teeth and washing hands).                  Pertinent Vitals/Pain Pain Assessment Pain Assessment: 0-10 Pain Score: 4  Pain Location: Lower back Pain Descriptors / Indicators: Shooting, Discomfort Pain Intervention(s): Limited activity within patient's tolerance, Monitored during session, Repositioned     Hand  Dominance     Extremity/Trunk Assessment Upper Extremity Assessment Upper Extremity Assessment: Overall WFL for tasks assessed   Lower Extremity Assessment Lower Extremity Assessment: Overall WFL for tasks assessed       Communication Communication Communication: HOH;No difficulties   Cognition Arousal/Alertness: Awake/alert Behavior During Therapy: WFL for tasks assessed/performed Overall Cognitive Status: Within Functional Limits for tasks assessed                                                  Home Living Family/patient expects to be discharged to:: Private residence Living Arrangements: Spouse/significant other (Pt reports providing care for spouse)   Type of Home: House Home Access: Stairs to enter Entergy CorporationEntrance Stairs-Number of Steps: 3 Entrance Stairs-Rails: None Home Layout: Two level;Able to live on main level with bedroom/bathroom     Bathroom Shower/Tub: Producer, television/film/videoWalk-in shower   Bathroom Toilet: Handicapped height     Home Equipment: None          Prior Functioning/Environment Prior Level of Function : Independent/Modified Independent;Driving             Mobility Comments: Independent ADLs Comments: Independent        OT Problem List: Decreased strength;Decreased range of motion;Decreased activity tolerance      OT Treatment/Interventions:      OT Goals(Current goals can be found in the care plan section) Acute Rehab OT Goals Patient Stated Goal: to be able to care for her husband OT Goal Formulation: With patient Time For Goal Achievement: 06/04/22 Potential to Achieve Goals: Good  OT Frequency:         AM-PAC OT "6 Clicks" Daily Activity     Outcome Measure Help from another person eating meals?: None Help from another person taking care of personal grooming?: A Little Help from another person toileting, which includes using toliet, bedpan, or urinal?: None Help from another person bathing (including washing, rinsing,  drying)?: A Little Help from another person to put on and taking off regular upper body clothing?: None Help from another person to put on and taking off regular lower body clothing?: A Little 6 Click Score: 21   End of Session Equipment Utilized During Treatment: Gait belt  Activity Tolerance: Patient tolerated treatment well Patient left: in chair;with chair alarm set;with call bell/phone within reach  OT Visit Diagnosis: Muscle weakness (generalized) (M62.81)                Time: [id number] OT Time Calculation (min): 39 min Charges:  OT General Charges $OT Visit: 1 Visit OT Evaluation $OT Eval Low Complexity: 1 Low OT Treatments $Self Care/Home Management : 23-37 mins  [employer] Sherine Cortese, OTDS  [employer] Samil Mecham 05/21/2022, 11:20 AM

## 2022-05-21 NOTE — Plan of Care (Signed)

## 2022-05-21 NOTE — Discharge Instructions (Signed)
Your surgeon has performed an operation on your lumbar spine (low back) to relieve pressure on one or more nerves. Many times, patients feel better immediately after surgery and can "overdo it." Even if you feel well, it is important that you follow these activity guidelines. If you do not let your back heal properly from the surgery, you can increase the chance of a disc herniation and/or return of your symptoms. The following are instructions to help in your recovery once you have been discharged from the hospital.   Activity    No bending, lifting, or twisting ("BLT"). Avoid lifting objects heavier than 10 pounds (gallon milk jug).  Where possible, avoid household activities that involve lifting, bending, pushing, or pulling such as laundry, vacuuming, grocery shopping, and childcare. Try to arrange for help from friends and family for these activities while your back heals.  Increase physical activity slowly as tolerated.  Taking short walks is encouraged, but avoid strenuous exercise. Do not jog, run, bicycle, lift weights, or participate in any other exercises unless specifically allowed by your doctor. Avoid prolonged sitting, including car rides.  Talk to your doctor before resuming sexual activity.  You should not drive until cleared by your doctor.  Until released by your doctor, you should not return to work or school.  You should rest at home and let your body heal.   You may shower two days after your surgery.  After showering, lightly dab your incision dry. Do not take a tub bath or go swimming for 3 weeks, or until approved by your doctor at your follow-up appointment.  If you smoke, we strongly recommend that you quit.  Smoking has been proven to interfere with normal healing in your back and will dramatically reduce the success rate of your surgery. Please contact [facility] (800-QUIT-NOW) and use the resources at www.[facility].com for assistance in stopping smoking.  Surgical  Incision   If you have a dressing on your incision, you may remove it three days after your surgery. Keep your incision area clean and dry.  If you have staples or stitches on your incision, you should have a follow up scheduled for removal. If you do not have staples or stitches, you will have steri-strips (small pieces of surgical tape) or Dermabond glue. The steri-strips/glue should begin to peel away within about a week (it is fine if the steri-strips fall off before then). If the strips are still in place one week after your surgery, you may gently remove them.  Diet            You may return to your usual diet. Be sure to stay hydrated.  When to Contact Us  Although your surgery and recovery will likely be uneventful, you may have some residual numbness, aches, and pains in your back and/or legs. This is normal and should improve in the next few weeks.  However, should you experience any of the following, contact us immediately: New numbness or weakness Pain that is progressively getting worse, and is not relieved by your pain medications or rest Bleeding, redness, swelling, pain, or drainage from surgical incision Chills or flu-like symptoms Fever greater than 101.0 F (38.3 C) Problems with bowel or bladder functions Difficulty breathing or shortness of breath Warmth, tenderness, or swelling in your calf  Contact Information During office hours (Monday-Friday 9 am to 5 pm), please call your physician at [phone] After hours and weekends, please call [phone] and speak with the answering service, who will contact the   doctor on call.  If that fails, call the [facility] Operator at [phone] and ask for the Neurosurgery Resident On Call  For a life-threatening emergency, call 911  

## 2022-05-21 NOTE — Evaluation (Signed)
Physical Therapy Evaluation Patient Details Name: Emily Landry MRN: [id number] DOB: 1951-05-16 Today's Date: 05/21/2022  History of Present Illness  Pt is a 71 y/o F s/p lumbar surgery (L2-3 and L4-5 decompression, R L3-4 and L5-S1 microdiscectomies).  Clinical Impression  Pt received seated in recliner upon arrival to room and pt agreeable to therapy.  Pt performed well with transfer to standing and able to ambulate in room without AD, but was reaching for counter and other items for stability within the room.  Upon leaving the room, she was able to ambulate well without AD.  Pt then performed stair training in PT gym and was less stable with alternating steps, along with pain in the R LE.  Pt educated on taking step-to pattern instead of reciprocal up/down the stairs, and also to use her L LE going up, and the R LE going down.  Pt performed much better and with more stability.  Pt then transferred back to room and left in recliner with all needs met.  Pt does ambulate better with RW and suggested to have that at home for stability.  Current discharge plans to home with OPPT are appropriate at this time.  Pt will continue to benefit from skilled therapy in order to address deficits listed below.      Recommendations for follow up therapy are one component of a multi-disciplinary discharge planning process, led by the attending physician.  Recommendations may be updated based on patient status, additional functional criteria and insurance authorization.  Follow Up Recommendations Outpatient PT    Assistance Recommended at Discharge PRN  Patient can return home with the following  A little help with walking and/or transfers;A little help with bathing/dressing/bathroom    Equipment Recommendations Rolling walker (2 wheels)  Recommendations for Other Services       Functional Status Assessment Patient has had a recent decline in their functional status and demonstrates the ability to make  significant improvements in function in a reasonable and predictable amount of time.     Precautions / Restrictions Precautions Precautions: Back;Fall Precaution Comments: Spinal pcns, No brace requried Restrictions Weight Bearing Restrictions: No      Mobility  Bed Mobility Overal bed mobility: Needs Assistance             General bed mobility comments: pt upright in recliner upon arrival to room.    Transfers Overall transfer level: Needs assistance Equipment used: None Transfers: Sit to/from Stand Sit to Stand: Supervision                Ambulation/Gait Ambulation/Gait assistance: Supervision Gait Distance (Feet): 180 Feet Assistive device: None, Rolling walker (2 wheels) Gait Pattern/deviations: Step-through pattern, Decreased stride length Gait velocity: decreased     General Gait Details: pt ambulates fairly well with some mild instability without an AD.  Pt able to ambulate with RW and much more stabl when performing with AD.  Stairs            Wheelchair Mobility    Modified Rankin (Stroke Patients Only)       Balance Overall balance assessment: Needs assistance Sitting-balance support: Feet supported, No upper extremity supported Sitting balance-Leahy Scale: Good     Standing balance support: No upper extremity supported, During functional activity Standing balance-Leahy Scale: Good Standing balance comment: Occasionally would reach out for HHA along the railing in the hallway and in counter in the room.  Pertinent Vitals/Pain Pain Assessment Pain Assessment: 0-10 Pain Score: 4  Pain Location: Lower back Pain Descriptors / Indicators: Shooting, Discomfort Pain Intervention(s): Limited activity within patient's tolerance, Monitored during session, Repositioned    Home Living Family/patient expects to be discharged to:: Private residence Living Arrangements: Spouse/significant other (Pt  reports providing care for spouse)   Type of Home: House Home Access: Stairs to enter Entrance Stairs-Rails: None Entrance Stairs-Number of Steps: 3   Home Layout: Two level;Able to live on main level with bedroom/bathroom Home Equipment: None      Prior Function Prior Level of Function : Independent/Modified Independent;Driving             Mobility Comments: Independent ADLs Comments: Independent     Hand Dominance        Extremity/Trunk Assessment   Upper Extremity Assessment Upper Extremity Assessment: Overall WFL for tasks assessed    Lower Extremity Assessment Lower Extremity Assessment: Overall WFL for tasks assessed       Communication   Communication: HOH;No difficulties (Has hearing aid in the L ear and can read lips well.)  Cognition Arousal/Alertness: Awake/alert Behavior During Therapy: WFL for tasks assessed/performed Overall Cognitive Status: Within Functional Limits for tasks assessed                                          General Comments      Exercises Other Exercises Other Exercises: pt educated on role of therapy and services provided during hospital stay.   Assessment/Plan    PT Assessment Patient needs continued PT services  PT Problem List Decreased strength;Decreased balance;Decreased mobility       PT Treatment Interventions DME instruction;Gait training;Stair training;Therapeutic activities;Therapeutic exercise;Functional mobility training;Balance training    PT Goals (Current goals can be found in the Care Plan section)  Acute Rehab PT Goals Patient Stated Goal: to go home. PT Goal Formulation: With patient Time For Goal Achievement: 06/04/22 Potential to Achieve Goals: Good    Frequency Min 2X/week     Co-evaluation               AM-PAC PT "6 Clicks" Mobility  Outcome Measure Help needed turning from your back to your side while in a flat bed without using bedrails?: A Little Help needed  moving from lying on your back to sitting on the side of a flat bed without using bedrails?: A Little Help needed moving to and from a bed to a chair (including a wheelchair)?: A Little Help needed standing up from a chair using your arms (e.g., wheelchair or bedside chair)?: A Little Help needed to walk in hospital room?: A Little Help needed climbing 3-5 steps with a railing? : A Little 6 Click Score: 18    End of Session Equipment Utilized During Treatment: Gait belt Activity Tolerance: Patient tolerated treatment well Patient left: in chair;with call bell/phone within reach;with chair alarm set;with family/visitor present Nurse Communication: Mobility status PT Visit Diagnosis: Unsteadiness on feet (R26.81);Other abnormalities of gait and mobility (R26.89);Muscle weakness (generalized) (M62.81)    Time: 1610-9604 PT Time Calculation (min) (ACUTE ONLY): 25 min   Charges:   PT Evaluation $PT Eval Low Complexity: 1 Low PT Treatments $Therapeutic Activity: 8-22 mins        Gwenlyn Saran, PT, DPT 05/21/22, 12:22 PM   Christie Nottingham 05/21/2022, 12:11 PM

## 2022-05-21 NOTE — Progress Notes (Signed)
    Attending Progress Note  History: Emily Landry is s/p right L5-S1 and L3-4 microdiscectomy. L2-3 and L4-5 decompression  POD1: Some intermittent right leg pain overnight. Pt denies headaches.   Physical Exam: Vitals:   05/21/22 0329 05/21/22 0804  BP: 138/68 139/68  Pulse: 75 80  Resp: 15 18  Temp: 98.1 F (36.7 C) 98.1 F (36.7 C)  SpO2: 95% 100%    AA Ox3 CNI  Strength:5/5 throughout BLE  Incision with clean post-op bandage in place  Data:  Recent Labs  Lab 05/14/22 1512 05/20/22 1609  NA 140  --   K 3.6  --   CL 107  --   CO2 29  --   BUN 13  --   CREATININE 0.50 0.44  GLUCOSE 116*  --   CALCIUM 9.0  --    No results for input(s): "AST", "ALT", "ALKPHOS" in the last 168 hours.  Invalid input(s): "TBILI"   Recent Labs  Lab 05/20/22 1609  WBC 5.1  HGB 13.4  HCT 40.3  PLT 229   No results for input(s): "APTT", "INR" in the last 168 hours.       Other tests/results: none   Assessment/Plan:  Emily Landry is a 78 presenting with lumbar stenosis and neurogenic claudication s/p right L5-S1 and L3-4 microdiscectomy. L2-3 and L4-5 decompression.   - mobilize; pt ok to sit up and ambulate as tolerated.  - pain control - DVT prophylaxis - PTOT  Manning Charity PA-C Department of Neurosurgery

## 2022-05-21 NOTE — Discharge Summary (Signed)
Physician Discharge Summary  Patient ID: Emily Landry MRN: [id number] DOB/AGE: 1951/07/24 71 y.o.  Admit date: 05/20/2022 Discharge date: 05/21/2022  Admission Diagnoses: Neurogenic claudication due to lumbar spinal stenosis M48.062, Lumbar radiculopathy M54.16  Discharge Diagnoses:  Principal Problem:   Lumbar stenosis   Discharged Condition: good  Hospital Course:  LUCIEL BRICKMAN is a 71 y.o s/p right L5-S1 and L3-4 microdiscectomy. L2-3 and L4-5 decompression on 05/20/22. She was admitted overnight for pain control and therapy evaluation. She experienced some radiating leg pain when ambulating with PT but overall felt her pain was well controlled. She was deemed appropriate for discharge hom on POD1. She was given prescriptions for Oxycodone to take as needed for breakthrough pain, Robaxin, and Senna.   Consults: None  Significant Diagnostic Studies: none   Treatments: surgery: as above. Please see separately   Discharge Exam: Blood pressure (!) 148/65, pulse 77, temperature 98.1 F (36.7 C), temperature source Oral, resp. rate 18, height  (1.702 m), weight 95.3 kg, SpO2 96 %.  AA Ox3 CNI   Strength:5/5 throughout BLE  Incision with clean post-op bandage in place  Disposition: Discharge disposition: 01-Home or Self Care       Discharge Instructions     Incentive spirometry RT   Complete by: As directed    Remove dressing in 48 hours   Complete by: As directed       Allergies as of 05/21/2022   No Known Allergies      Medication List     STOP taking these medications    oxyCODONE-acetaminophen 10-325 MG tablet Commonly known as: Percocet       TAKE these medications    buPROPion 150 MG 24 hr tablet Commonly known as: WELLBUTRIN XL Take 3 tablets (450 mg total) by mouth daily. What changed: when to take this   D3 5000 PO Take 1 tablet by mouth daily.   DULoxetine 60 MG capsule Commonly known as: CYMBALTA Take 1 capsule (60 mg total) by  mouth daily. What changed: when to take this   folic acid 1 MG tablet Commonly known as: FOLVITE Take 1 mg by mouth daily.   gabapentin 400 MG capsule Commonly known as: Neurontin Take 1 capsule (400 mg total) by mouth 3 (three) times daily. What changed: when to take this   methocarbamol 500 MG tablet Commonly known as: ROBAXIN Take 1 tablet (500 mg total) by mouth every 6 (six) hours as needed for muscle spasms.   methotrexate 2.5 MG tablet Commonly known as: RHEUMATREX Take 2.5 mg by mouth once a week. 4 tablets on Wednesdays   morphine 15 MG 12 hr tablet Commonly known as: MS CONTIN Take 1 tablet (15 mg total) by mouth every 12 (twelve) hours. Must last 30 days. What changed: Another medication with the same name was removed. Continue taking this medication, and follow the directions you see here.   omeprazole 40 MG capsule Commonly known as: PRILOSEC TAKE 1 CAPSULE DAILY BEFORE BREAKFAST. What changed: See the new instructions.   oxyCODONE 5 MG immediate release tablet Commonly known as: Oxy IR/ROXICODONE Take 1 tablet (5 mg total) by mouth every 4 (four) hours as needed for up to 5 days for breakthrough pain (pain refractory to home Morphine).   senna 8.6 MG Tabs tablet Commonly known as: SENOKOT Take 1 tablet (8.6 mg total) by mouth daily as needed for mild constipation.               Durable Medical Equipment  (  From admission, onward)           Start     Ordered   05/21/22 1148  For home use only DME Walker rolling  Once       Question Answer Comment  Walker: With 5 Inch Wheels   Patient needs a walker to treat with the following condition Generalized weakness      05/21/22 1147            Follow-up Information     Susanne BordersKoch, Hosanna Betley Lee, PA Follow up in 2 week(s).   Why: for post-op and incision check. This appointment date and time is listed on your pre-op paperwork. Please call the office with any questions or concerns regarding appointment  date or time Contact information: [address] [place] [id number] [phone]                 Signed: Susanne BordersDanielle Lee Truly Stankiewicz 05/21/2022, 4:56 PM

## 2022-06-17 ENCOUNTER — Encounter (HOSPITAL_COMMUNITY): Payer: Self-pay

## 2022-06-17 ENCOUNTER — Ambulatory Visit (HOSPITAL_COMMUNITY): Payer: Medicare PPO | Admitting: Licensed Clinical Social Worker

## 2022-06-18 ENCOUNTER — Encounter: Payer: Self-pay | Admitting: Student in an Organized Health Care Education/Training Program

## 2022-06-18 ENCOUNTER — Ambulatory Visit
Payer: Medicare PPO | Attending: Student in an Organized Health Care Education/Training Program | Admitting: Student in an Organized Health Care Education/Training Program

## 2022-06-18 VITALS — BP 156/94 | HR 79 | Temp 97.3°F | Resp 16 | Ht 67.0 in | Wt 205.0 lb

## 2022-06-18 DIAGNOSIS — G8929 Other chronic pain: Secondary | ICD-10-CM | POA: Diagnosis present

## 2022-06-18 DIAGNOSIS — M961 Postlaminectomy syndrome, not elsewhere classified: Secondary | ICD-10-CM | POA: Diagnosis present

## 2022-06-18 DIAGNOSIS — G894 Chronic pain syndrome: Secondary | ICD-10-CM | POA: Insufficient documentation

## 2022-06-18 DIAGNOSIS — M5416 Radiculopathy, lumbar region: Secondary | ICD-10-CM | POA: Insufficient documentation

## 2022-06-18 DIAGNOSIS — M722 Plantar fascial fibromatosis: Secondary | ICD-10-CM | POA: Insufficient documentation

## 2022-06-18 DIAGNOSIS — F4310 Post-traumatic stress disorder, unspecified: Secondary | ICD-10-CM | POA: Diagnosis present

## 2022-06-18 MED ORDER — DULOXETINE HCL 60 MG PO CPEP: 60.0000 mg | ORAL_CAPSULE | Freq: Every day | ORAL | 1 refills | Status: DC

## 2022-06-18 MED ORDER — OXYCODONE-ACETAMINOPHEN 10-325 MG PO TABS: 1.0000 | ORAL_TABLET | Freq: Three times a day (TID) | ORAL | 0 refills | Status: DC | PRN

## 2022-06-18 MED ORDER — BUPROPION HCL ER (XL) 150 MG PO TB24: 450.0000 mg | ORAL_TABLET | Freq: Every day | ORAL | 0 refills | Status: DC

## 2022-06-18 MED ORDER — OXYCODONE-ACETAMINOPHEN 10-325 MG PO TABS: 1.0000 | ORAL_TABLET | Freq: Three times a day (TID) | ORAL | 0 refills | Status: AC | PRN

## 2022-06-18 NOTE — Progress Notes (Signed)
PROVIDER NOTE: Information contained herein reflects review and annotations entered in association with encounter. Interpretation of such information and data should be left to medically-trained personnel. Information provided to patient can be located elsewhere in the medical record under "Patient Instructions". Document created using STT-dictation technology, any transcriptional errors that may result from process are unintentional.    Patient: Alma Friendly  Service Category: E/M  Provider: Gillis Santa, MD  DOB: [birth year]  DOS: 06/18/2022  Specialty: Interventional Pain Management  MRN: [id number]  Setting: Ambulatory outpatient  PCP: Derinda Late, MD  Type: Established Patient    Referring Provider: Derinda Late, MD  Location: Office  Delivery: Face-to-face     HPI  Ms. Kingston Guiles, a 71 y.o. year old female, is here today because of her Chronic radicular lumbar pain [M54.16, G89.29]. Ms. Santarelli primary complain today is Back Pain Last encounter: My last encounter with her was on 04/02/2022. Pertinent problems: Ms. Buckbee has Chronic pain syndrome; Failed back surgical syndrome; Lumbar radiculopathy; Chronic pain of both shoulders; PTSD (post-traumatic stress disorder); Plantar fasciitis of right foot; and Intercostal neuralgia on their pertinent problem list. Pain Assessment: Severity of Chronic pain is reported as a 4 /10. Location: Back Mid, Upper/to neck. Onset: More than a month ago. Quality: Constant (Pt states that since back surgery 05/20/22, has been experienceing "numbness when touched" on arms bilat). Timing: Constant. Modifying factor(s): meds. Vitals:  height is _0  (1.702 m) and weight is 205 lb (93 kg). Her temporal temperature is 97.3 F (36.3 C) (abnormal). Her blood pressure is 156/94 (abnormal) and her pulse is 79. Her respiration is 16 and oxygen saturation is 96%.   Reason for encounter: medication management.  Leani presents today for medication management.   Since her last clinic visit with me, she went on to have a L2-L3 and L4-L5 decompression along with right L3-L4 and right L5-S1 microdiscectomies.  She states that she is doing much better after her surgery and that she has less overall pain and is utilizing less medication.  She would like to discontinue morphine and is requesting a slight increase in her Percocet so that she can take it every 8 hours as needed.  She continues gabapentin, Robaxin as prescribed.  She is also on Cymbalta.  She will be seeing Dr. Cari Caraway for follow-up in about 2 weeks.  She states that she did have increased postoperative bleeding at surgical site but that improved over time.  She is trying to be mindful of her activity restrictions and limitations.  Pharmacotherapy Assessment  Analgesic: Discontinue morphine, increase Percocet from 10 mg every 12 hours to 10 mg every 8 hours as needed.  Overall dose reduction from MME 60 to MME 45     Monitoring: [place] PMP: PDMP reviewed during this encounter.       Pharmacotherapy: No side-effects or adverse reactions reported. Compliance: No problems identified. Effectiveness: Clinically acceptable.  Rise Patience, RN  06/18/2022 10:21 AM  Sign when Signing Visit Nursing Pain Medication Assessment:  Safety precautions to be maintained throughout the outpatient stay will include: orient to surroundings, keep bed in low position, maintain call bell within reach at all times, provide assistance with transfer out of bed and ambulation.  Medication Inspection Compliance: Pill count conducted under aseptic conditions, in front of the patient. Neither the pills nor the bottle was removed from the patient's sight at any time. Once count was completed pills were immediately returned to the patient in their original bottle.  Medication #1: Oxycodone/APAP  Pill/Patch Count:  44 of 60 pills remain Pill/Patch Appearance: Markings consistent with prescribed medication Bottle Appearance: Standard  pharmacy container. Clearly labeled. Filled Date: 06 / 30 / 2023 Last Medication intake:  Today  Medication #2: Morphine ER (MSContin) Pill/Patch Count:  59 of 60 pills remain Pill/Patch Appearance: Markings consistent with prescribed medication Bottle Appearance: Standard pharmacy container. Clearly labeled. Filled Date: 06 / 30 / 2023 Last Medication intake:  Day before yesterday    UDS:  Summary  Date Value Ref Range Status  03/26/2022 Note  Final    Comment:    ==================================================================== [employer] Select 13 (MW) ==================================================================== Test                             Result       Flag       Units  Drug Present and Declared for Prescription Verification   Morphine                       >10204       EXPECTED   ng/mg creat   Normorphine                    409          EXPECTED   ng/mg creat    Potential sources of large amounts of morphine in the absence of    codeine include administration of morphine or use of heroin.     Normorphine is an expected metabolite of morphine.    Hydromorphone                  72           EXPECTED   ng/mg creat    Hydromorphone may be present as a metabolite of morphine;    concentrations of hydromorphone rarely exceed 5% of the morphine    concentration when this is the source of hydromorphone.    Oxycodone                      1493         EXPECTED   ng/mg creat   Oxymorphone                    155          EXPECTED   ng/mg creat   Noroxycodone                   4240         EXPECTED   ng/mg creat    Sources of oxycodone include scheduled prescription medications.    Oxymorphone and noroxycodone are expected metabolites of oxycodone.    Oxymorphone is also available as a scheduled prescription medication.  Drug Present not Declared for Prescription Verification   Carboxy-THC                    15           UNEXPECTED ng/mg creat    Carboxy-THC is a  metabolite of tetrahydrocannabinol (THC). Source of    THC is most commonly herbal marijuana or marijuana-based products,    but THC is also present in a scheduled prescription medication.    Trace amounts of THC can be present in hemp and cannabidiol (CBD)    products. This test is not intended to distinguish between delta-9-    tetrahydrocannabinol,  the predominant form of THC in most herbal or    marijuana-based products, and delta-8-tetrahydrocannabinol.  ==================================================================== Test                      Result    Flag   Units      Ref Range   Creatinine              98               mg/dL      >=20 ==================================================================== Declared Medications:  The flagging and interpretation on this report are based on the  following declared medications.  Unexpected results may arise from  inaccuracies in the declared medications.   **Note: The testing scope of this panel includes these medications:   Morphine (MS Contin)  Oxycodone (Percocet)   **Note: The testing scope of this panel does not include the  following reported medications:   Acetaminophen (Percocet)  Bupropion (Wellbutrin)  Cholecalciferol  Duloxetine (Cymbalta)  Folic Acid  Gabapentin (Neurontin)  Methotrexate  Omeprazole (Prilosec)  Triamcinolone (Kenalog) ==================================================================== For clinical consultation, please call [phone]. ====================================================================      ROS  Constitutional: Denies any fever or chills Gastrointestinal: No reported hemesis, hematochezia, vomiting, or acute GI distress Musculoskeletal: Denies any acute onset joint swelling, redness, loss of ROM, or weakness Neurological: No reported episodes of acute onset apraxia, aphasia, dysarthria, agnosia, amnesia, paralysis, loss of coordination, or loss of  consciousness  Medication Review  Cholecalciferol, DULoxetine, buPROPion, folic acid, gabapentin, methocarbamol, methotrexate, omeprazole, oxyCODONE-acetaminophen, and senna  History Review  Allergy: Ms. Rosner has No Known Allergies. Drug: Ms. Diers  reports no history of drug use. Alcohol:  reports current alcohol use. Tobacco:  reports that she has quit smoking. Her smoking use included cigarettes. She has never used smokeless tobacco. Social: Ms. Mckissack  reports that she has quit smoking. Her smoking use included cigarettes. She has never used smokeless tobacco. She reports current alcohol use. She reports that she does not use drugs. Medical:  has a past medical history of Anxiety, Arthritis, Chronic back pain, Depression, Elevated blood pressure reading, GERD (gastroesophageal reflux disease), History of hiatal hernia, PONV (postoperative nausea and vomiting), PTSD (post-traumatic stress disorder), and Refusal of blood transfusions as patient is Jehovah's Witness. Surgical: Ms. Lines  has a past surgical history that includes Laminectomy (1971); Lumbar disc surgery (1991); Tonsillectomy; Cholecystectomy; Abdominal hysterectomy; Colonoscopy; and Lumbar laminectomy/decompression microdiscectomy (N/A, 05/20/2022). Family: family history includes Alcohol abuse in her father; Arthritis in her father; Breast cancer (age of onset: 54) in her mother; Diabetes in her mother; Gout in her father; Rheum arthritis in her mother.  Laboratory Chemistry Profile   Renal Lab Results  Component Value Date   BUN 13 05/14/2022   CREATININE 0.44 05/20/2022   GFRNONAA >60 05/20/2022    Hepatic Lab Results  Component Value Date   AST 31 05/21/2020   ALT 22 05/21/2020   ALBUMIN 4.2 05/21/2020   ALKPHOS 71 05/21/2020    Electrolytes Lab Results  Component Value Date   NA 140 05/14/2022   K 3.6 05/14/2022   CL 107 05/14/2022   CALCIUM 9.0 05/14/2022    Bone No results found for: "VD25OH",  "VD125OH2TOT", "UV2536UY4", "IH4742VZ5", "25OHVITD1", "25OHVITD2", "25OHVITD3", "TESTOFREE", "TESTOSTERONE"  Inflammation (CRP: Acute Phase) (ESR: Chronic Phase) Lab Results  Component Value Date   ESRSEDRATE 6 12/06/2019         Note: Above Lab results reviewed.  Recent Imaging Review  DG Lumbar Spine 2-3 Views CLINICAL DATA:  Intraoperative imaging for decompression and microdiskectomy  EXAM: LUMBAR SPINE - 2-3 VIEW  COMPARISON:  MRI lumbar spine April 12, 2022  FLUOROSCOPY: Exposure Index (as provided by the fluoroscopic device): 11.472 mGy Kerma fluoroscopy time of 10 seconds.  FINDINGS: Six spot fluoroscopic intraoperative images were provided for localization of L2-3 and L4-5 decompression as well as L3-4 and L5-S1 microdiskectomy.  IMPRESSION: Fluoroscopic intraoperative images for lumbosacral decompression and microdiskectomy.  Electronically Signed   By: Dahlia Bailiff M.D.   On: 05/20/2022 13:18 DG C-Arm 1-60 Min-No Report Fluoroscopy was utilized by the requesting physician.  No radiographic  interpretation.  DG C-Arm 1-60 Min-No Report Fluoroscopy was utilized by the requesting physician.  No radiographic  interpretation.  DG C-Arm 1-60 Min-No Report Fluoroscopy was utilized by the requesting physician.  No radiographic  interpretation.  Note: Reviewed        Physical Exam  General appearance: Well nourished, well developed, and well hydrated. In no apparent acute distress Mental status: Alert, oriented x 3 (person, place, & time)       Respiratory: No evidence of acute respiratory distress Eyes: PERLA Vitals: BP (!) 156/94   Pulse 79   Temp (!) 97.3 F (36.3 C) (Temporal)   Resp 16   Ht _0  (1.702 m)   Wt 205 lb (93 kg)   SpO2 96%   BMI 32.11 kg/m  BMI: Estimated body mass index is 32.11 kg/m as calculated from the following:   Height as of this encounter: _1  (1.702 m).   Weight as of this encounter: 205 lb (93 kg). Ideal: Ideal body  weight: 61.6 kg (135 lb 12.9 oz) Adjusted ideal body weight: 74.2 kg (163 lb 7.7 oz)  5 out of 5 strength bilateral lower extremity: Plantar flexion, dorsiflexion, knee flexion, knee extension.   Assessment   Diagnosis Status  1. Chronic radicular lumbar pain   2. Lumbar radiculopathy   3. Plantar fasciitis of right foot   4. PTSD (post-traumatic stress disorder)   5. Lumbar post-laminectomy syndrome   6. Failed back surgical syndrome   7. Chronic pain syndrome    Controlled Controlled Controlled   Updated Problems: No problems updated.  Plan of Care  Problem-specific:  No problem-specific Assessment & Plan notes found for this encounter.  Ms. Nichola Cieslinski has a current medication list which includes the following long-term medication(s): gabapentin, omeprazole, bupropion, and duloxetine.  Pharmacotherapy (Medications Ordered): Meds ordered this encounter  Medications   DULoxetine (CYMBALTA) 60 MG capsule    Sig: Take 1 capsule (60 mg total) by mouth daily.    Dispense:  90 capsule    Refill:  1   buPROPion (WELLBUTRIN XL) 150 MG 24 hr tablet    Sig: Take 3 tablets (450 mg total) by mouth daily.    Dispense:  270 tablet    Refill:  0   oxyCODONE-acetaminophen (PERCOCET) 10-325 MG tablet    Sig: Take 1 tablet by mouth every 8 (eight) hours as needed for pain. Must last 30 days.    Dispense:  90 tablet    Refill:  0    Chronic Pain: STOP Act (Not applicable) Fill 1 day early if closed on refill date. Avoid benzodiazepines within 8 hours of opioids   oxyCODONE-acetaminophen (PERCOCET) 10-325 MG tablet    Sig: Take 1 tablet by mouth every 8 (eight) hours as needed for pain. Must last 30 days.    Dispense:  90 tablet  Refill:  0    Chronic Pain: STOP Act (Not applicable) Fill 1 day early if closed on refill date. Avoid benzodiazepines within 8 hours of opioids   oxyCODONE-acetaminophen (PERCOCET) 10-325 MG tablet    Sig: Take 1 tablet by mouth every 8 (eight) hours  as needed for pain. Must last 30 days.    Dispense:  90 tablet    Refill:  0    Chronic Pain: STOP Act (Not applicable) Fill 1 day early if closed on refill date. Avoid benzodiazepines within 8 hours of opioids    Follow-up plan:   Return in about 14 weeks (around 09/24/2022) for Medication Management, in person.    Recent Visits Date Type Provider Dept  03/26/22 Office Visit Gillis Santa, MD [facility]  Showing recent visits within past 90 days and meeting all other requirements Today's Visits Date Type Provider Dept  06/18/22 Office Visit Gillis Santa, MD [facility]  Showing today's visits and meeting all other requirements Future Appointments No visits were found meeting these conditions. Showing future appointments within next 90 days and meeting all other requirements  I discussed the assessment and treatment plan with the patient. The patient was provided an opportunity to ask questions and all were answered. The patient agreed with the plan and demonstrated an understanding of the instructions.  Patient advised to call back or seek an in-person evaluation if the symptoms or condition worsens.  Duration of encounter: 52mnutes.  Total time on encounter, as per AMA guidelines included both the face-to-face and non-face-to-face time personally spent by the physician and/or other qualified health care professional(s) on the day of the encounter (includes time in activities that require the physician or other qualified health care professional and does not include time in activities normally performed by clinical staff). Physician's time may include the following activities when performed: preparing to see the patient (eg, review of tests, pre-charting review of records) obtaining and/or reviewing separately obtained history performing a medically appropriate examination and/or evaluation counseling and educating the patient/family/caregiver ordering  medications, tests, or procedures referring and communicating with other health care professionals (when not separately reported) documenting clinical information in the electronic or other health record independently interpreting results (not separately reported) and communicating results to the patient/ family/caregiver care coordination (not separately reported)  Note by: BGillis Santa MD Date: 06/18/2022; Time: 11:23 AM

## 2022-06-18 NOTE — Progress Notes (Signed)
Nursing Pain Medication Assessment:  Safety precautions to be maintained throughout the outpatient stay will include: orient to surroundings, keep bed in low position, maintain call bell within reach at all times, provide assistance with transfer out of bed and ambulation.  Medication Inspection Compliance: Pill count conducted under aseptic conditions, in front of the patient. Neither the pills nor the bottle was removed from the patient's sight at any time. Once count was completed pills were immediately returned to the patient in their original bottle.  Medication #1: Oxycodone/APAP Pill/Patch Count:  44 of 60 pills remain Pill/Patch Appearance: Markings consistent with prescribed medication Bottle Appearance: Standard pharmacy container. Clearly labeled. Filled Date: 06 / 30 / 2023 Last Medication intake:  Today  Medication #2: Morphine ER (MSContin) Pill/Patch Count:  59 of 60 pills remain Pill/Patch Appearance: Markings consistent with prescribed medication Bottle Appearance: Standard pharmacy container. Clearly labeled. Filled Date: 06 / 30 / 2023 Last Medication intake:  Day before yesterday

## 2022-06-22 ENCOUNTER — Telehealth: Payer: Self-pay

## 2022-06-22 NOTE — Telephone Encounter (Signed)
-----   Message from Venetia Night, MD sent at 06/22/2022  9:22 AM EDT ----- Her incision looks a lot better in the picture.  Are we doing medihoney and dressing changes? That might be helpful for her.  Please reassure her that it will heal over time.

## 2022-06-23 NOTE — Telephone Encounter (Signed)
Pt came by this morning to pick up Medihoney

## 2022-07-02 ENCOUNTER — Ambulatory Visit: Payer: Medicare PPO | Admitting: Neurosurgery

## 2022-07-02 VITALS — BP 128/76 | Temp 97.7°F | Ht 67.0 in | Wt 205.0 lb

## 2022-07-02 DIAGNOSIS — Z9889 Other specified postprocedural states: Secondary | ICD-10-CM

## 2022-07-02 NOTE — Progress Notes (Signed)
   DOS: 05/20/22 (L2-S1 decompression, R L3-4 and L5-S1 microdiscectomy)  HISTORY OF PRESENT ILLNESS: 07/02/2022 [name] is status post lumbar decompression.  She is doing well.  Her leg discomfort is much improved.   PHYSICAL EXAMINATION:   Vitals:   07/02/22 1148  BP: 128/76  Temp: 97.7 F (36.5 C)   General: Patient is well developed, well nourished, calm, collected, and in no apparent distress.  NEUROLOGICAL:  General: In no acute distress.  Awake, alert, oriented to person, place, and time. Pupils equal round and reactive to light.   Strength:  Side Iliopsoas Quads Hamstring PF DF EHL  R 5 5 5 5 5 5   L 5 5 5 5 5 5    Incision has improved significantly with Medihoney  ROS (Neurologic): Negative except as noted above  IMAGING: No interval imaging to review   ASSESSMENT/PLAN:  Emily Landry is doing well after lumbar decompression.  I think her incision will continue to heal.  She will continue Medihoney for now.  I will see her back in approximately 6 weeks.  We reviewed her activity limitations.  I spent a total of 15 minutes in face-to-face and non-face-to-face activities related to this patient's care today.   MD, [facility] Department of Neurosurgery

## 2022-07-28 ENCOUNTER — Encounter: Payer: Self-pay | Admitting: Neurosurgery

## 2022-07-28 ENCOUNTER — Other Ambulatory Visit: Payer: Self-pay | Admitting: Student in an Organized Health Care Education/Training Program

## 2022-08-18 ENCOUNTER — Encounter: Payer: Medicare PPO | Admitting: Neurosurgery

## 2022-08-20 ENCOUNTER — Other Ambulatory Visit: Payer: Self-pay | Admitting: Student in an Organized Health Care Education/Training Program

## 2022-08-21 NOTE — Telephone Encounter (Signed)
Appt scheduled

## 2022-08-27 ENCOUNTER — Ambulatory Visit (INDEPENDENT_AMBULATORY_CARE_PROVIDER_SITE_OTHER): Payer: Medicare PPO | Admitting: Neurosurgery

## 2022-08-27 ENCOUNTER — Encounter: Payer: Self-pay | Admitting: Neurosurgery

## 2022-08-27 VITALS — BP 132/82 | Ht 67.0 in | Wt 211.2 lb

## 2022-08-27 DIAGNOSIS — Z09 Encounter for follow-up examination after completed treatment for conditions other than malignant neoplasm: Secondary | ICD-10-CM | POA: Diagnosis not present

## 2022-08-27 DIAGNOSIS — M542 Cervicalgia: Secondary | ICD-10-CM

## 2022-08-27 DIAGNOSIS — M5416 Radiculopathy, lumbar region: Secondary | ICD-10-CM

## 2022-08-27 DIAGNOSIS — M48062 Spinal stenosis, lumbar region with neurogenic claudication: Secondary | ICD-10-CM | POA: Diagnosis not present

## 2022-08-27 NOTE — Progress Notes (Signed)
   DOS: 05/20/22 (L2-S1 decompression, R L3-4 and L5-S1 microdiscectomy)  HISTORY OF PRESENT ILLNESS: 08/27/2022 Ordered unfortunately suffered a fall, but is doing relatively well from that.  Her leg symptoms are much better than prior to surgery.  She still has some achy neck pain and some pain around her tailbone.  07/09/2022 [name] is status post lumbar decompression.  She is doing well.  Her leg discomfort is much improved.   PHYSICAL EXAMINATION:   There were no vitals filed for this visit.  General: Patient is well developed, well nourished, calm, collected, and in no apparent distress.  NEUROLOGICAL:  General: In no acute distress.  Awake, alert, oriented to person, place, and time. Pupils equal round and reactive to light.   Strength:  Side Iliopsoas Quads Hamstring PF DF EHL  R 5 5 5 5 5 5   L 5 5 5 5 5 5     ROS (Neurologic): Negative except as noted above  IMAGING: No interval imaging to review   ASSESSMENT/PLAN:  Emily Landry is doing well after lumbar decompression.  For her neck pain, we discussed referral to physical therapy.  She would like to try exercises at home.  I will give those to her.  She is established with Dr. .  I will send a message to him as he may have some injection options for her tailbone and neck pain.  I will see her back as needed.  She is off activity limitations.      [name] MD, [facility] Department of Neurosurgery

## 2022-08-31 ENCOUNTER — Other Ambulatory Visit: Payer: Self-pay | Admitting: Otolaryngology

## 2022-08-31 DIAGNOSIS — H9312 Tinnitus, left ear: Secondary | ICD-10-CM

## 2022-08-31 DIAGNOSIS — H9042 Sensorineural hearing loss, unilateral, left ear, with unrestricted hearing on the contralateral side: Secondary | ICD-10-CM

## 2022-09-02 ENCOUNTER — Other Ambulatory Visit: Payer: Self-pay | Admitting: Family Medicine

## 2022-09-02 DIAGNOSIS — Z1231 Encounter for screening mammogram for malignant neoplasm of breast: Secondary | ICD-10-CM

## 2022-09-10 ENCOUNTER — Other Ambulatory Visit: Payer: Self-pay | Admitting: *Deleted

## 2022-09-10 ENCOUNTER — Encounter: Payer: Self-pay | Admitting: Student in an Organized Health Care Education/Training Program

## 2022-09-11 MED ORDER — GABAPENTIN 400 MG PO CAPS: 400.0000 mg | ORAL_CAPSULE | ORAL | 1 refills | Status: DC

## 2022-09-17 ENCOUNTER — Ambulatory Visit
Payer: Medicare PPO | Attending: Student in an Organized Health Care Education/Training Program | Admitting: Student in an Organized Health Care Education/Training Program

## 2022-09-17 ENCOUNTER — Encounter: Payer: Self-pay | Admitting: Student in an Organized Health Care Education/Training Program

## 2022-09-17 VITALS — BP 143/98 | HR 76 | Temp 97.0°F | Resp 17 | Ht 67.0 in | Wt 205.0 lb

## 2022-09-17 DIAGNOSIS — M722 Plantar fascial fibromatosis: Secondary | ICD-10-CM | POA: Insufficient documentation

## 2022-09-17 DIAGNOSIS — M533 Sacrococcygeal disorders, not elsewhere classified: Secondary | ICD-10-CM | POA: Insufficient documentation

## 2022-09-17 DIAGNOSIS — Z87891 Personal history of nicotine dependence: Secondary | ICD-10-CM | POA: Diagnosis not present

## 2022-09-17 DIAGNOSIS — G894 Chronic pain syndrome: Secondary | ICD-10-CM | POA: Diagnosis present

## 2022-09-17 DIAGNOSIS — F4310 Post-traumatic stress disorder, unspecified: Secondary | ICD-10-CM | POA: Insufficient documentation

## 2022-09-17 DIAGNOSIS — M961 Postlaminectomy syndrome, not elsewhere classified: Secondary | ICD-10-CM | POA: Diagnosis not present

## 2022-09-17 DIAGNOSIS — M5416 Radiculopathy, lumbar region: Secondary | ICD-10-CM

## 2022-09-17 DIAGNOSIS — M1712 Unilateral primary osteoarthritis, left knee: Secondary | ICD-10-CM | POA: Diagnosis not present

## 2022-09-17 DIAGNOSIS — G8929 Other chronic pain: Secondary | ICD-10-CM

## 2022-09-17 MED ORDER — DULOXETINE HCL 60 MG PO CPEP: 60.0000 mg | ORAL_CAPSULE | Freq: Every day | ORAL | 1 refills | Status: DC

## 2022-09-17 MED ORDER — OXYCODONE-ACETAMINOPHEN 10-325 MG PO TABS: 1.0000 | ORAL_TABLET | Freq: Three times a day (TID) | ORAL | 0 refills | Status: DC | PRN

## 2022-09-17 MED ORDER — GABAPENTIN 400 MG PO CAPS: 400.0000 mg | ORAL_CAPSULE | ORAL | 1 refills | Status: DC

## 2022-09-17 MED ORDER — OXYCODONE-ACETAMINOPHEN 10-325 MG PO TABS: 1.0000 | ORAL_TABLET | Freq: Three times a day (TID) | ORAL | 0 refills | Status: AC | PRN

## 2022-09-17 NOTE — Patient Instructions (Signed)
______________________________________________________________________  Preparing for Procedure with Sedation  NOTICE: Due to recent regulatory changes, starting on July 14, 2021, procedures requiring intravenous (IV) sedation will no longer be performed at the [facility].  These types of procedures are required to be performed at [facility] ambulatory surgery facility.  We are very sorry for the inconvenience.  Procedure appointments are limited to planned procedures: No Prescription Refills. No disability issues will be discussed. No medication changes will be discussed.  Instructions: Oral Intake: Do not eat or drink anything for at least 8 hours prior to your procedure. (Exception: Blood Pressure Medication. See below.) Transportation: A driver is required. You may not drive yourself after the procedure. Blood Pressure Medicine: Do not forget to take your blood pressure medicine with a sip of water the morning of the procedure. If your Diastolic (lower reading) is above 100 mmHg, elective cases will be cancelled/rescheduled. Blood thinners: These will need to be stopped for procedures. Notify our staff if you are taking any blood thinners. Depending on which one you take, there will be specific instructions on how and when to stop it. Diabetics on insulin: Notify the staff so that you can be scheduled 1st case in the morning. If your diabetes requires high dose insulin, take only  of your normal insulin dose the morning of the procedure and notify the staff that you have done so. Preventing infections: Shower with an antibacterial soap the morning of your procedure. Build-up your immune system: Take 1000 mg of Vitamin C with every meal (3 times a day) the day prior to your procedure. Antibiotics: Inform the staff if you have a condition or reason that requires you to take antibiotics before dental procedures. Pregnancy: If you are pregnant, call and cancel the procedure. Sickness: If  you have a cold, fever, or any active infections, call and cancel the procedure. Arrival: You must be in the facility at least 30 minutes prior to your scheduled procedure. Children: Do not bring children with you. Dress appropriately: There is always the possibility that your clothing may get soiled. Valuables: Do not bring any jewelry or valuables.  Reasons to call and reschedule or cancel your procedure: (Following these recommendations will minimize the risk of a serious complication.) Surgeries: Avoid having procedures within 2 weeks of any surgery. (Avoid for 2 weeks before or after any surgery). Flu Shots: Avoid having procedures within 2 weeks of a flu shots. (Avoid for 2 weeks before or after immunizations). Barium: Avoid having a procedure within 7-10 days after having had a radiological study involving the use of radiological contrast. (Myelograms, Barium swallow or enema study). Heart attacks: Avoid any elective procedures or surgeries for the initial 6 months after a "Myocardial Infarction" (Heart Attack). Blood thinners: It is imperative that you stop these medications before procedures. Let us know if you if you take any blood thinner.  Infection: Avoid procedures during or within two weeks of an infection (including chest colds or gastrointestinal problems). Symptoms associated with infections include: Localized redness, fever, chills, night sweats or profuse sweating, burning sensation when voiding, cough, congestion, stuffiness, runny nose, sore throat, diarrhea, nausea, vomiting, cold or Flu symptoms, recent or current infections. It is specially important if the infection is over the area that we intend to treat. Heart and lung problems: Symptoms that may suggest an active cardiopulmonary problem include: cough, chest pain, breathing difficulties or shortness of breath, dizziness, ankle swelling, uncontrolled high or unusually low blood pressure, and/or palpitations. If you are    experiencing any of these symptoms, cancel your procedure and contact your primary care physician for an evaluation.  Remember:  Regular Business hours are:  Monday to Thursday 8:00 AM to 4:00 PM  Provider's Schedule: Francisco Naveira, MD:  Procedure days: Tuesday and Thursday 7:30 AM to 4:00 PM  Bilal Lateef, MD:  Procedure days: Monday and Wednesday 7:30 AM to 4:00 PM ______________________________________________________________________   

## 2022-09-17 NOTE — Progress Notes (Signed)
Nursing Pain Medication Assessment:  Safety precautions to be maintained throughout the outpatient stay will include: orient to surroundings, keep bed in low position, maintain call bell within reach at all times, provide assistance with transfer out of bed and ambulation.  Medication Inspection Compliance: Pill count conducted under aseptic conditions, in front of the patient. Neither the pills nor the bottle was removed from the patient's sight at any time. Once count was completed pills were immediately returned to the patient in their original bottle.  Medication: Oxycodone/APAP Pill/Patch Count:  38 of 90 pills remain Pill/Patch Appearance: Markings consistent with prescribed medication Bottle Appearance: Standard pharmacy container. Clearly labeled. Filled Date: 09 / 18 / 2023 Last Medication intake:  Today

## 2022-09-17 NOTE — Progress Notes (Signed)
PROVIDER NOTE: Information contained herein reflects review and annotations entered in association with encounter. Interpretation of such information and data should be left to medically-trained personnel. Information provided to patient can be located elsewhere in the medical record under "Patient Instructions". Document created using STT-dictation technology, any transcriptional errors that may result from process are unintentional.    Patient: Emily Landry  Service Category: E/M  Provider: Gillis Santa, MD  DOB: 08/11/1951  DOS: 09/17/2022  Specialty: Interventional Pain Management  MRN: [id number]  Setting: Ambulatory outpatient  PCP: Derinda Late, MD  Type: Established Patient    Referring Provider: Derinda Late, MD  Location: Office  Delivery: Face-to-face     HPI  Ms. Sache Sane, a 71 y.o. year old female, is here today because of her Chronic radicular lumbar pain [M54.16, G89.29]. Ms. Gutt primary complain today is Back Pain and Neck Pain Last encounter: My last encounter with her was on 06/18/22 Pertinent problems: Ms. Cansler has Chronic pain syndrome; Lumbar post-laminectomy syndrome; Lumbar radiculopathy; Chronic pain of both shoulders; PTSD (post-traumatic stress disorder); Plantar fasciitis of right foot; and Intercostal neuralgia on their pertinent problem list. Pain Assessment: Severity of Chronic pain is reported as a 4 /10. Location: Back Mid, Upper/mid back pain radiates to right intercostal area. Onset: More than a month ago. Quality: Other (Comment) (grabbing). Timing: Constant. Modifying factor(s): meds. Vitals:  height is 5' 7"  (1.702 m) and weight is 205 lb (93 kg). Her temporal temperature is 97 F (36.1 C) (abnormal). Her blood pressure is 143/98 (abnormal) and her pulse is 76. Her respiration is 17 and oxygen saturation is 99%.   Reason for encounter: medication management.  Christian presents today for medication management.  She has recovered from her L2-L3  and L4-L5 decompression along with right L3-L4 and right L5-S1 microdiscectomies.  She states that she is doing much better after her surgery and that she has less overall pain and is utilizing less medication.  Given this fact, her morphine was discontinued and her oxycodone was adjusted to 10 mg every 8 hours as needed.  She is on a single opioid pharmacologic right now rather than dual therapy.  States that she fell approx 4 weeks ago and injured her left knee. Has been experiencing increased left knee pain.  She is complaining of pain overlying her coccyx and states that it does radiate out the longer she stands.  We discussed a caudal epidural steroid injection.  Risk and benefits reviewed and patient would like to proceed below.  I have also offered her a left knee intra-articular steroid injection which she would like to consider as well.  Otherwise, patient continues gabapentin and Cymbalta as prescribed as a part of her multimodal pain regimen.    Pharmacotherapy Assessment  Analgesic:  Percocet  10 mg every 8 hours as needed.  MME 45     Monitoring: [place] PMP: PDMP reviewed during this encounter.       Pharmacotherapy: No side-effects or adverse reactions reported. Compliance: No problems identified. Effectiveness: Clinically acceptable.  Rise Patience, RN  09/17/2022 10:58 AM  Sign when Signing Visit Nursing Pain Medication Assessment:  Safety precautions to be maintained throughout the outpatient stay will include: orient to surroundings, keep bed in low position, maintain call bell within reach at all times, provide assistance with transfer out of bed and ambulation.  Medication Inspection Compliance: Pill count conducted under aseptic conditions, in front of the patient. Neither the pills nor the bottle was removed from the patient's sight  at any time. Once count was completed pills were immediately returned to the patient in their original bottle.  Medication:  Oxycodone/APAP Pill/Patch Count:  38 of 90 pills remain Pill/Patch Appearance: Markings consistent with prescribed medication Bottle Appearance: Standard pharmacy container. Clearly labeled. Filled Date: 09 / 18 / 2023 Last Medication intake:  Today    UDS:  Summary  Date Value Ref Range Status  03/26/2022 Note  Final    Comment:    ==================================================================== [employer] Select 13 (MW) ==================================================================== Test                             Result       Flag       Units  Drug Present and Declared for Prescription Verification   Morphine                       >10204       EXPECTED   ng/mg creat   Normorphine                    409          EXPECTED   ng/mg creat    Potential sources of large amounts of morphine in the absence of    codeine include administration of morphine or use of heroin.     Normorphine is an expected metabolite of morphine.    Hydromorphone                  72           EXPECTED   ng/mg creat    Hydromorphone may be present as a metabolite of morphine;    concentrations of hydromorphone rarely exceed 5% of the morphine    concentration when this is the source of hydromorphone.    Oxycodone                      1493         EXPECTED   ng/mg creat   Oxymorphone                    155          EXPECTED   ng/mg creat   Noroxycodone                   4240         EXPECTED   ng/mg creat    Sources of oxycodone include scheduled prescription medications.    Oxymorphone and noroxycodone are expected metabolites of oxycodone.    Oxymorphone is also available as a scheduled prescription medication.  Drug Present not Declared for Prescription Verification   Carboxy-THC                    15           UNEXPECTED ng/mg creat    Carboxy-THC is a metabolite of tetrahydrocannabinol (THC). Source of    THC is most commonly herbal marijuana or marijuana-based products,    but THC is also  present in a scheduled prescription medication.    Trace amounts of THC can be present in hemp and cannabidiol (CBD)    products. This test is not intended to distinguish between delta-9-    tetrahydrocannabinol, the predominant form of THC in most herbal or    marijuana-based products, and delta-8-tetrahydrocannabinol.  ==================================================================== Test  Result    Flag   Units      Ref Range   Creatinine              98               mg/dL      >=20 ==================================================================== Declared Medications:  The flagging and interpretation on this report are based on the  following declared medications.  Unexpected results may arise from  inaccuracies in the declared medications.   **Note: The testing scope of this panel includes these medications:   Morphine (MS Contin)  Oxycodone (Percocet)   **Note: The testing scope of this panel does not include the  following reported medications:   Acetaminophen (Percocet)  Bupropion (Wellbutrin)  Cholecalciferol  Duloxetine (Cymbalta)  Folic Acid  Gabapentin (Neurontin)  Methotrexate  Omeprazole (Prilosec)  Triamcinolone (Kenalog) ==================================================================== For clinical consultation, please call [phone]. ====================================================================      ROS  Constitutional: Denies any fever or chills Gastrointestinal: No reported hemesis, hematochezia, vomiting, or acute GI distress Musculoskeletal:  Coccydynia, left knee pain Neurological: No reported episodes of acute onset apraxia, aphasia, dysarthria, agnosia, amnesia, paralysis, loss of coordination, or loss of consciousness  Medication Review  DULoxetine, buPROPion, folic acid, gabapentin, methocarbamol, methotrexate, and oxyCODONE-acetaminophen  History Review  Allergy: Ms. Gaydos has No Known  Allergies. Drug: Ms. Lover  reports no history of drug use. Alcohol:  reports current alcohol use. Tobacco:  reports that she has quit smoking. Her smoking use included cigarettes. She has never used smokeless tobacco. Social: Ms. Grossberg  reports that she has quit smoking. Her smoking use included cigarettes. She has never used smokeless tobacco. She reports current alcohol use. She reports that she does not use drugs. Medical:  has a past medical history of Anxiety, Arthritis, Chronic back pain, Depression, Elevated blood pressure reading, GERD (gastroesophageal reflux disease), History of hiatal hernia, PONV (postoperative nausea and vomiting), PTSD (post-traumatic stress disorder), and Refusal of blood transfusions as patient is Jehovah's Witness. Surgical: Ms. Brandner  has a past surgical history that includes Laminectomy (1971); Lumbar disc surgery (1991); Tonsillectomy; Cholecystectomy; Abdominal hysterectomy; Colonoscopy; and Lumbar laminectomy/decompression microdiscectomy (N/A, 05/20/2022). Family: family history includes Alcohol abuse in her father; Arthritis in her father; Breast cancer (age of onset: 28) in her mother; Diabetes in her mother; Gout in her father; Rheum arthritis in her mother.  Laboratory Chemistry Profile   Renal Lab Results  Component Value Date   BUN 13 05/14/2022   CREATININE 0.44 05/20/2022   GFRNONAA >60 05/20/2022    Hepatic Lab Results  Component Value Date   AST 31 05/21/2020   ALT 22 05/21/2020   ALBUMIN 4.2 05/21/2020   ALKPHOS 71 05/21/2020    Electrolytes Lab Results  Component Value Date   NA 140 05/14/2022   K 3.6 05/14/2022   CL 107 05/14/2022   CALCIUM 9.0 05/14/2022    Bone No results found for: "VD25OH", "VD125OH2TOT", "DU2025KY7", "CW2376EG3", "25OHVITD1", "25OHVITD2", "25OHVITD3", "TESTOFREE", "TESTOSTERONE"  Inflammation (CRP: Acute Phase) (ESR: Chronic Phase) Lab Results  Component Value Date   ESRSEDRATE 6 12/06/2019          Note: Above Lab results reviewed.  Recent Imaging Review  DG Lumbar Spine 2-3 Views CLINICAL DATA:  Intraoperative imaging for decompression and microdiskectomy  EXAM: LUMBAR SPINE - 2-3 VIEW  COMPARISON:  MRI lumbar spine April 12, 2022  FLUOROSCOPY: Exposure Index (as provided by the fluoroscopic device): 11.472 mGy Kerma fluoroscopy time of 10 seconds.  FINDINGS:  Six spot fluoroscopic intraoperative images were provided for localization of L2-3 and L4-5 decompression as well as L3-4 and L5-S1 microdiskectomy.  IMPRESSION: Fluoroscopic intraoperative images for lumbosacral decompression and microdiskectomy.  Electronically Signed   By: Dahlia Bailiff M.D.   On: 05/20/2022 13:18 DG C-Arm 1-60 Min-No Report Fluoroscopy was utilized by the requesting physician.  No radiographic  interpretation.  DG C-Arm 1-60 Min-No Report Fluoroscopy was utilized by the requesting physician.  No radiographic  interpretation.  DG C-Arm 1-60 Min-No Report Fluoroscopy was utilized by the requesting physician.  No radiographic  interpretation.  Note: Reviewed        Physical Exam  General appearance: Well nourished, well developed, and well hydrated. In no apparent acute distress Mental status: Alert, oriented x 3 (person, place, & time)       Respiratory: No evidence of acute respiratory distress Eyes: PERLA Vitals: BP (!) 143/98   Pulse 76   Temp (!) 97 F (36.1 C) (Temporal)   Resp 17   Ht 5' 7"  (1.702 m)   Wt 205 lb (93 kg)   SpO2 99%   BMI 32.11 kg/m  BMI: Estimated body mass index is 32.11 kg/m as calculated from the following:   Height as of this encounter: 5' 7"  (1.702 m).   Weight as of this encounter: 205 lb (93 kg). Ideal: Ideal body weight: 61.6 kg (135 lb 12.9 oz) Adjusted ideal body weight: 74.2 kg (163 lb 7.7 oz)  Pain overlying coccyx.  Tender to palpation.  Does radiate laterally the longer she stands.  Left knee pain, arthropathic arthralgia.  Normal  flexion extension.  Stable.  5 out of 5 strength bilateral lower extremity: Plantar flexion, dorsiflexion, knee flexion, knee extension.   Assessment   Diagnosis Status  1. Chronic radicular lumbar pain   2. Coccydynia   3. Primary osteoarthritis of left knee   4. Lumbar radiculopathy   5. Lumbar post-laminectomy syndrome   6. Chronic pain syndrome     Controlled Controlled Controlled   Updated Problems: Problem  Lumbar Post-Laminectomy Syndrome  Lumbar Radiculopathy  Coccydynia  Primary Osteoarthritis of Left Knee    Plan of Care    Ms. Lamekia Nolden has a current medication list which includes the following long-term medication(s): bupropion, duloxetine, and gabapentin.  Pharmacotherapy (Medications Ordered): Meds ordered this encounter  Medications   oxyCODONE-acetaminophen (PERCOCET) 10-325 MG tablet    Sig: Take 1 tablet by mouth every 8 (eight) hours as needed for pain. Must last 30 days.    Dispense:  90 tablet    Refill:  0    Chronic Pain: STOP Act (Not applicable) Fill 1 day early if closed on refill date. Avoid benzodiazepines within 8 hours of opioids   oxyCODONE-acetaminophen (PERCOCET) 10-325 MG tablet    Sig: Take 1 tablet by mouth every 8 (eight) hours as needed for pain. Must last 30 days.    Dispense:  90 tablet    Refill:  0    Chronic Pain: STOP Act (Not applicable) Fill 1 day early if closed on refill date. Avoid benzodiazepines within 8 hours of opioids   oxyCODONE-acetaminophen (PERCOCET) 10-325 MG tablet    Sig: Take 1 tablet by mouth every 8 (eight) hours as needed for pain. Must last 30 days.    Dispense:  90 tablet    Refill:  0    Chronic Pain: STOP Act (Not applicable) Fill 1 day early if closed on refill date. Avoid benzodiazepines within 8 hours of opioids  gabapentin (NEURONTIN) 400 MG capsule    Sig: Take 1 capsule (400 mg total) by mouth every morning.    Dispense:  90 capsule    Refill:  1   DULoxetine (CYMBALTA) 60 MG  capsule    Sig: Take 1 capsule (60 mg total) by mouth daily.    Dispense:  90 capsule    Refill:  1    Follow-up plan:   Return in about 2 weeks (around 10/01/2022) for caudal ESI and left knee steroid, in clinic (PO Valium).    Recent Visits No visits were found meeting these conditions. Showing recent visits within past 90 days and meeting all other requirements Today's Visits Date Type Provider Dept  09/17/22 Office Visit Gillis Santa, MD [facility]  Showing today's visits and meeting all other requirements Future Appointments No visits were found meeting these conditions. Showing future appointments within next 90 days and meeting all other requirements  I discussed the assessment and treatment plan with the patient. The patient was provided an opportunity to ask questions and all were answered. The patient agreed with the plan and demonstrated an understanding of the instructions.  Patient advised to call back or seek an in-person evaluation if the symptoms or condition worsens.  Duration of encounter: 3mnutes.  Total time on encounter, as per AMA guidelines included both the face-to-face and non-face-to-face time personally spent by the physician and/or other qualified health care professional(s) on the day of the encounter (includes time in activities that require the physician or other qualified health care professional and does not include time in activities normally performed by clinical staff). Physician's time may include the following activities when performed: preparing to see the patient (eg, review of tests, pre-charting review of records) obtaining and/or reviewing separately obtained history performing a medically appropriate examination and/or evaluation counseling and educating the patient/family/caregiver ordering medications, tests, or procedures referring and communicating with other health care professionals (when not separately  reported) documenting clinical information in the electronic or other health record independently interpreting results (not separately reported) and communicating results to the patient/ family/caregiver care coordination (not separately reported)  Note by: BGillis Santa MD Date: 09/17/2022; Time: 11:22 AM

## 2022-09-29 ENCOUNTER — Ambulatory Visit: Payer: Medicare PPO

## 2022-09-29 ENCOUNTER — Ambulatory Visit
Admission: RE | Admit: 2022-09-29 | Discharge: 2022-09-29 | Disposition: A | Discharge status: Home / Self Care | Payer: Medicare PPO | Source: Ambulatory Visit | Attending: Otolaryngology | Admitting: Otolaryngology

## 2022-09-29 DIAGNOSIS — H9312 Tinnitus, left ear: Secondary | ICD-10-CM

## 2022-09-29 DIAGNOSIS — H9042 Sensorineural hearing loss, unilateral, left ear, with unrestricted hearing on the contralateral side: Secondary | ICD-10-CM

## 2022-09-29 MED ORDER — GADOBENATE DIMEGLUMINE 529 MG/ML IV SOLN
19.0000 mL | Freq: Once | INTRAVENOUS | Status: AC | PRN
  Administered 2022-09-29: 19 mL via INTRAVENOUS

## 2022-10-19 DIAGNOSIS — Z9889 Other specified postprocedural states: Secondary | ICD-10-CM

## 2022-10-20 MED ORDER — METHOCARBAMOL 500 MG PO TABS: 500.0000 mg | ORAL_TABLET | Freq: Four times a day (QID) | ORAL | 0 refills | Status: DC | PRN

## 2022-10-21 ENCOUNTER — Encounter: Payer: Self-pay | Admitting: Student in an Organized Health Care Education/Training Program

## 2022-10-21 ENCOUNTER — Ambulatory Visit
Admission: RE | Admit: 2022-10-21 | Discharge: 2022-10-21 | Disposition: A | Discharge status: Home / Self Care | Payer: Medicare PPO | Source: Ambulatory Visit | Attending: Student in an Organized Health Care Education/Training Program | Admitting: Student in an Organized Health Care Education/Training Program

## 2022-10-21 ENCOUNTER — Ambulatory Visit
Payer: Medicare PPO | Attending: Student in an Organized Health Care Education/Training Program | Admitting: Student in an Organized Health Care Education/Training Program

## 2022-10-21 VITALS — BP 155/97 | HR 69 | Temp 97.9°F | Resp 16 | Ht 67.0 in | Wt 203.0 lb

## 2022-10-21 DIAGNOSIS — M5416 Radiculopathy, lumbar region: Secondary | ICD-10-CM | POA: Diagnosis not present

## 2022-10-21 DIAGNOSIS — M1712 Unilateral primary osteoarthritis, left knee: Secondary | ICD-10-CM | POA: Diagnosis present

## 2022-10-21 DIAGNOSIS — G8929 Other chronic pain: Secondary | ICD-10-CM | POA: Insufficient documentation

## 2022-10-21 DIAGNOSIS — M533 Sacrococcygeal disorders, not elsewhere classified: Secondary | ICD-10-CM | POA: Diagnosis present

## 2022-10-21 DIAGNOSIS — G894 Chronic pain syndrome: Secondary | ICD-10-CM | POA: Diagnosis present

## 2022-10-21 MED ORDER — METHYLPREDNISOLONE ACETATE 40 MG/ML IJ SUSP
INTRAMUSCULAR | Status: AC
  Filled 2022-10-21: qty 1

## 2022-10-21 MED ORDER — DEXAMETHASONE SODIUM PHOSPHATE 10 MG/ML IJ SOLN
10.0000 mg | Freq: Once | INTRAMUSCULAR | Status: AC
Start: 2022-10-21 — End: 2022-10-21
  Administered 2022-10-21: 10 mg

## 2022-10-21 MED ORDER — ROPIVACAINE HCL 2 MG/ML IJ SOLN
INTRAMUSCULAR | Status: AC
  Filled 2022-10-21: qty 20

## 2022-10-21 MED ORDER — DIAZEPAM 5 MG PO TABS
5.0000 mg | ORAL_TABLET | ORAL | Status: AC
Start: 2022-10-21 — End: 2022-10-21
  Administered 2022-10-21: 5 mg via ORAL

## 2022-10-21 MED ORDER — DIAZEPAM 5 MG PO TABS
ORAL_TABLET | ORAL | Status: AC
  Filled 2022-10-21: qty 1

## 2022-10-21 MED ORDER — DEXAMETHASONE SODIUM PHOSPHATE 10 MG/ML IJ SOLN
INTRAMUSCULAR | Status: AC
  Filled 2022-10-21: qty 1

## 2022-10-21 MED ORDER — IOHEXOL 180 MG/ML  SOLN
10.0000 mL | Freq: Once | INTRAMUSCULAR | Status: AC
  Administered 2022-10-21: 10 mL via EPIDURAL

## 2022-10-21 MED ORDER — SODIUM CHLORIDE 0.9% FLUSH
2.0000 mL | Freq: Once | INTRAVENOUS | Status: AC
  Administered 2022-10-21: 2 mL

## 2022-10-21 MED ORDER — METHYLPREDNISOLONE ACETATE 40 MG/ML IJ SUSP
40.0000 mg | Freq: Once | INTRAMUSCULAR | Status: AC
  Administered 2022-10-21: 40 mg via INTRA_ARTICULAR

## 2022-10-21 MED ORDER — IOHEXOL 180 MG/ML  SOLN
INTRAMUSCULAR | Status: AC
  Filled 2022-10-21: qty 20

## 2022-10-21 MED ORDER — ROPIVACAINE HCL 2 MG/ML IJ SOLN
2.0000 mL | Freq: Once | INTRAMUSCULAR | Status: AC
  Administered 2022-10-21: 2 mL via EPIDURAL

## 2022-10-21 MED ORDER — LIDOCAINE HCL 2 % IJ SOLN
20.0000 mL | Freq: Once | INTRAMUSCULAR | Status: AC
  Administered 2022-10-21: 100 mg

## 2022-10-21 MED ORDER — LIDOCAINE HCL (PF) 2 % IJ SOLN
INTRAMUSCULAR | Status: AC
  Filled 2022-10-21: qty 10

## 2022-10-21 MED ORDER — SODIUM CHLORIDE (PF) 0.9 % IJ SOLN
INTRAMUSCULAR | Status: AC
  Filled 2022-10-21: qty 10

## 2022-10-21 NOTE — Patient Instructions (Signed)
Pain Management Discharge Instructions  General Discharge Instructions :  If you need to reach your doctor call: Monday-Friday 8:00 am - 4:00 pm at [phone] or toll free [phone].  After clinic hours [phone] to have operator reach doctor.  Bring all of your medication bottles to all your appointments in the pain clinic.  To cancel or reschedule your appointment with Pain Management please remember to call 24 hours in advance to avoid a fee.  Refer to the educational materials which you have been given on: General Risks, I had my Procedure. Discharge Instructions, Post Sedation.  Post Procedure Instructions:  The drugs you were given will stay in your system until tomorrow, so for the next 24 hours you should not drive, make any legal decisions or drink any alcoholic beverages.  You may eat anything you prefer, but it is better to start with liquids then soups and crackers, and gradually work up to solid foods.  Please notify your doctor immediately if you have any unusual bleeding, trouble breathing or pain that is not related to your normal pain.  Depending on the type of procedure that was done, some parts of your body may feel week and/or numb.  This usually clears up by tonight or the next day.  Walk with the use of an assistive device or accompanied by an adult for the 24 hours.  You may use ice on the affected area for the first 24 hours.  Put ice in a Ziploc bag and cover with a towel and place against area 15 minutes on 15 minutes off.  You may switch to heat after 24 hours.Epidural Steroid Injection Patient Information  Description: The epidural space surrounds the nerves as they exit the spinal cord.  In some patients, the nerves can be compressed and inflamed by a bulging disc or a tight spinal canal (spinal stenosis).  By injecting steroids into the epidural space, we can bring irritated nerves into direct contact with a potentially helpful medication.  These  steroids act directly on the irritated nerves and can reduce swelling and inflammation which often leads to decreased pain.  Epidural steroids may be injected anywhere along the spine and from the neck to the low back depending upon the location of your pain.   After numbing the skin with local anesthetic (like Novocaine), a small needle is passed into the epidural space slowly.  You may experience a sensation of pressure while this is being done.  The entire block usually last less than 10 minutes.  Conditions which may be treated by epidural steroids:  Low back and leg pain Neck and arm pain Spinal stenosis Post-laminectomy syndrome Herpes zoster (shingles) pain Pain from compression fractures  Preparation for the injection:  Do not eat any solid food or dairy products within 8 hours of your appointment.  You may drink clear liquids up to 3 hours before appointment.  Clear liquids include water, black coffee, juice or soda.  No milk or cream please. You may take your regular medication, including pain medications, with a sip of water before your appointment  Diabetics should hold regular insulin (if taken separately) and take 1/2 normal NPH dos the morning of the procedure.  Carry some sugar containing items with you to your appointment. A driver must accompany you and be prepared to drive you home after your procedure.  Bring all your current medications with your. An IV may be inserted and sedation may be given at the discretion of the physician.     A blood pressure cuff, EKG and other monitors will often be applied during the procedure.  Some patients may need to have extra oxygen administered for a short period. You will be asked to provide medical information, including your allergies, prior to the procedure.  We must know immediately if you are taking blood thinners (like Coumadin/Warfarin)  Or if you are allergic to IV iodine contrast (dye). We must know if you could possible be  pregnant.  Possible side-effects: Bleeding from needle site Infection (rare, may require surgery) Nerve injury (rare) Numbness & tingling (temporary) Difficulty urinating (rare, temporary) Spinal headache ( a headache worse with upright posture) Light -headedness (temporary) Pain at injection site (several days) Decreased blood pressure (temporary) Weakness in arm/leg (temporary) Pressure sensation in back/neck (temporary)  Call if you experience: Fever/chills associated with headache or increased back/neck pain. Headache worsened by an upright position. New onset weakness or numbness of an extremity below the injection site Hives or difficulty breathing (go to the emergency room) Inflammation or drainage at the infection site Severe back/neck pain Any new symptoms which are concerning to you  Please note:  Although the local anesthetic injected can often make your back or neck feel good for several hours after the injection, the pain will likely return.  It takes 3-7 days for steroids to work in the epidural space.  You may not notice any pain relief for at least that one week.  If effective, we will often do a series of three injections spaced 3-6 weeks apart to maximally decrease your pain.  After the initial series, we generally will wait several months before considering a repeat injection of the same type.  If you have any questions, please call [phone] [facility] 

## 2022-10-21 NOTE — Progress Notes (Signed)
PROVIDER NOTE: Interpretation of information contained herein should be left to medically-trained personnel. Specific patient instructions are provided elsewhere under "Patient Instructions" section of medical record. This document was created in part using STT-dictation technology, any transcriptional errors that may result from this process are unintentional.  Patient: Emily Landry Type: Established DOB: 10-Nov-1951 MRN: [id number] PCP: Kandyce Rud, MD  Service: Procedure DOS: 10/21/2022 Setting: Ambulatory Location: Ambulatory outpatient facility Delivery: Face-to-face Provider: Edward Jolly, MD Specialty: Interventional Pain Management Specialty designation: 09 Location: Outpatient facility Ref. Prov.: Edward Jolly, MD    Interventional Therapy      Procedure: Caudal Epidural Steroid Injection #1  Laterality: Midline         Level: Sacrococcygeal ligament  Imaging: Fluoroscopy-guided         Anesthesia: Local anesthesia (1-2% Lidocaine) Anxiolysis: Valium 5 mg p.o. DOS: 10/21/2022  Performed by: Edward Jolly, MD  Purpose: Diagnostic/Therapeutic Indications: Low back and lower extremity pain severe enough to impact quality of life or function. Rationale (medical necessity): procedure needed and proper for the diagnosis and/or treatment of Emily Landry's medical symptoms and needs. Coccydynia, lumbar radicular pain  NAS-11 Pain score:   Pre-procedure: 4 /10   Post-procedure: 4 /10     Target: Lumbosacral epidural canal  Location: Epidural space  Region: Caudal canal Approach: Percutaneous  Type of procedure: Epidural block  Position  Prep  Materials:  Position: Prone  Prep solution: DuraPrep (Iodine Povacrylex [0.7% available iodine] and Isopropyl Alcohol, 74% w/w) Prep Area: Entire posterior lumbosacral area  Materials:  Tray: Epidural Needle(s):  Type: Epidural  Gauge (G): 22 Length: 3.5-in  Qty: 1  Pre-op H&P Assessment:  Emily Landry is a 71 y.o. (year  old), female patient, seen today for interventional treatment. She  has a past surgical history that includes Laminectomy (1971); Lumbar disc surgery (1991); Tonsillectomy; Cholecystectomy; Abdominal hysterectomy; Colonoscopy; and Lumbar laminectomy/decompression microdiscectomy (N/A, 05/20/2022). Emily Landry has a current medication list which includes the following prescription(s): bupropion, duloxetine, folic acid, gabapentin, methocarbamol, methotrexate, oxycodone-acetaminophen, [START ON 10/30/2022] oxycodone-acetaminophen, and [START ON 11/29/2022] oxycodone-acetaminophen. Her primarily concern today is the Back Pain (lower) and Knee Pain (left)  Initial Vital Signs:  Pulse/HCG Rate: 69ECG Heart Rate: 80 Temp: 97.9 F (36.6 C) Resp: 16 BP: (!) 154/83 SpO2: 96 %  BMI: Estimated body mass index is 31.79 kg/m as calculated from the following:   Height as of this encounter: 5\' 7"  (1.702 m).   Weight as of this encounter: 203 lb (92.1 kg).  Risk Assessment: Allergies: Reviewed. She has No Known Allergies.  Allergy Precautions: None required Coagulopathies: Reviewed. None identified.  Blood-thinner therapy: None at this time Active Infection(s): Reviewed. None identified. Emily Landry is afebrile  Site Confirmation: Emily Landry was asked to confirm the procedure and laterality before marking the site Procedure checklist: Completed Consent: Before the procedure and under the influence of no sedative(s), amnesic(s), or anxiolytics, the patient was informed of the treatment options, risks and possible complications. To fulfill our ethical and legal obligations, as recommended by the American Medical Association's Code of Ethics, I have informed the patient of my clinical impression; the nature and purpose of the treatment or procedure; the risks, benefits, and possible complications of the intervention; the alternatives, including doing nothing; the risk(s) and benefit(s) of the alternative  treatment(s) or procedure(s); and the risk(s) and benefit(s) of doing nothing. The patient was provided information about the general risks and possible complications associated with the procedure. These may include, but are not limited to:  failure to achieve desired goals, infection, bleeding, organ or nerve damage, allergic reactions, paralysis, and death. In addition, the patient was informed of those risks and complications associated to Spine-related procedures, such as failure to decrease pain; infection (i.e.: Meningitis, epidural or intraspinal abscess); bleeding (i.e.: epidural hematoma, subarachnoid hemorrhage, or any other type of intraspinal or peri-dural bleeding); organ or nerve damage (i.e.: Any type of peripheral nerve, nerve root, or spinal cord injury) with subsequent damage to sensory, motor, and/or autonomic systems, resulting in permanent pain, numbness, and/or weakness of one or several areas of the body; allergic reactions; (i.e.: anaphylactic reaction); and/or death. Furthermore, the patient was informed of those risks and complications associated with the medications. These include, but are not limited to: allergic reactions (i.e.: anaphylactic or anaphylactoid reaction(s)); adrenal axis suppression; blood sugar elevation that in diabetics may result in ketoacidosis or comma; water retention that in patients with history of congestive heart failure may result in shortness of breath, pulmonary edema, and decompensation with resultant heart failure; weight gain; swelling or edema; medication-induced neural toxicity; particulate matter embolism and blood vessel occlusion with resultant organ, and/or nervous system infarction; and/or aseptic necrosis of one or more joints. Finally, the patient was informed that Medicine is not an exact science; therefore, there is also the possibility of unforeseen or unpredictable risks and/or possible complications that may result in a catastrophic  outcome. The patient indicated having understood very clearly. We have given the patient no guarantees and we have made no promises. Enough time was given to the patient to ask questions, all of which were answered to the patient's satisfaction. Emily Landry has indicated that she wanted to continue with the procedure. Attestation: I, the ordering provider, attest that I have discussed with the patient the benefits, risks, side-effects, alternatives, likelihood of achieving goals, and potential problems during recovery for the procedure that I have provided informed consent. Date  Time: 10/21/2022 10:34 AM  Imaging Guidance (Spinal):          Type of Imaging Technique: Fluoroscopy Guidance (Spinal) Indication(s): Assistance in needle guidance and placement for procedures requiring needle placement in or near specific anatomical locations not easily accessible without such assistance. Exposure Time: Please see nurses notes. Contrast: Before injecting any contrast, we confirmed that the patient did not have an allergy to iodine, shellfish, or radiological contrast. Once satisfactory needle placement was completed at the desired level, radiological contrast was injected. Contrast injected under live fluoroscopy. No contrast complications. See chart for type and volume of contrast used. Fluoroscopic Guidance: I was personally present during the use of fluoroscopy. "Tunnel Vision Technique" used to obtain the best possible view of the target area. Parallax error corrected before commencing the procedure. "Direction-depth-direction" technique used to introduce the needle under continuous pulsed fluoroscopy. Once target was reached, antero-posterior, oblique, and lateral fluoroscopic projection used confirm needle placement in all planes. Images permanently stored in EMR. Interpretation: I personally interpreted the imaging intraoperatively. Adequate needle placement confirmed in multiple planes. Appropriate  spread of contrast into desired area was observed. No evidence of afferent or efferent intravascular uptake. No intrathecal or subarachnoid spread observed. Permanent images saved into the patient's record.  Pre-Procedure Preparation:  Monitoring: As per clinic protocol. Respiration, ETCO2, SpO2, BP, heart rate and rhythm monitor placed and checked for adequate function Safety Precautions: Patient was assessed for positional comfort and pressure points before starting the procedure. Time-out: I initiated and conducted the "Time-out" before starting the procedure, as per protocol. The patient was asked  to participate by confirming the accuracy of the "Time Out" information. Verification of the correct person, site, and procedure were performed and confirmed by me, the nursing staff, and the patient. "Time-out" conducted as per Joint Commission's Universal Protocol (UP.01.01.01). Time: 1136  Description  Narrative of Procedure:          Procedural Technique Safety Precautions: Aspiration looking for blood return was conducted prior to all injections. At no point did we inject any substances, as a needle was being advanced. No attempts were made at seeking any paresthesias. Safe injection practices and needle disposal techniques used. Medications properly checked for expiration dates. SDV (single dose vial) medications used. Description of the Procedure: Protocol guidelines were followed. The patient was assisted into a comfortable position. The target area was identified and the area prepped in the usual manner. Skin & deeper tissues infiltrated with local anesthetic. Appropriate amount of time allowed to pass for local anesthetics to take effect. The procedure needles were then advanced to the target area. Proper needle placement secured. Negative aspiration confirmed. Solution injected in intermittent fashion, asking for systemic symptoms every 0.5cc of injectate. The needles were then removed and the  area cleansed, making sure to leave some of the prepping solution back to take advantage of its long term bactericidal properties.  Technical description of procedure:  Safety Precautions: Aspiration looking for blood return was conducted prior to all injections. At no point did we inject any substances, as a needle was being advanced. No attempts were made at seeking any paresthesias. Safe injection practices and needle disposal techniques used. Medications properly checked for expiration dates. SDV (single dose vial) medications used. Description of the Procedure: Protocol guidelines were followed. The patient was placed in position over the fluoroscopy table. The target area was identified and the area prepped in the usual manner. Skin & deeper tissues infiltrated with local anesthetic. Appropriate amount of time allowed to pass for local anesthetics to take effect. The procedure needle was then advanced to the target area. Proper needle placement secured. Negative aspiration confirmed. Solution injected in intermittent fashion, asking for systemic symptoms every 0.5cc of injectate. The needles were then removed and the area cleansed, making sure to leave some of the prepping solution back to take advantage of its long term bactericidal properties.  Vitals:   10/21/22 1058 10/21/22 1123 10/21/22 1136 10/21/22 1138  BP: (!) 154/83 (!) 154/98 (!) 159/92 (!) 155/97  Pulse: 69     Resp: 16 18 18 16   Temp: 97.9 F (36.6 C)     TempSrc: Temporal     SpO2: 96% 98% 95% 95%  Weight: 203 lb (92.1 kg)     Height: 5\' 7"  (1.702 m)       6 cc solution made of 3 cc of preservative-free saline, 2 cc of 0.2% ropivacaine, 1 cc of Decadron 10 mg/cc.   Start Time: 1136 hrs. End Time: 1138 hrs.  Post-operative Assessment:  Post-procedure Vital Signs:  Pulse/HCG Rate: 6975 Temp: 97.9 F (36.6 C) Resp: 16 BP: (!) 155/97 SpO2: 95 %  EBL: None  Complications: No immediate post-treatment complications  observed by team, or reported by patient.  Note: The patient tolerated the entire procedure well. A repeat set of vitals were taken after the procedure and the patient was kept under observation following institutional policy, for this type of procedure. Post-procedural neurological assessment was performed, showing return to baseline, prior to discharge. The patient was provided with post-procedure discharge instructions, including a section on  how to identify potential problems. Should any problems arise concerning this procedure, the patient was given instructions to immediately contact us, at any time, without hesitation. In any case, we plan to contact the patient by telephone for a follow-up status report regarding this interventional procedure.  Comments:  No additional relevant information.  Plan of Care  Orders:  Orders Placed This Encounter  Procedures   DG PAIN CLINIC C-ARM 1-60 MIN NO REPORT    Intraoperative interpretation by procedural physician at [facility].    Standing Status:   Standing    Number of Occurrences:   1    Order Specific Question:   Reason for exam:    Answer:   Assistance in needle guidance and placement for procedures requiring needle placement in or near specific anatomical locations not easily accessible without such assistance.   Chronic Opioid Analgesic:   Percocet  10 mg every 8 hours as needed.  MME 45     Medications ordered for procedure: Meds ordered this encounter  Medications   iohexol (OMNIPAQUE) 180 MG/ML injection 10 mL    Must be Myelogram-compatible. If not available, you may substitute with a water-soluble, non-ionic, hypoallergenic, myelogram-compatible radiological contrast medium.   lidocaine (XYLOCAINE) 2 % (with pres) injection 400 mg   diazepam (VALIUM) tablet 5 mg    Make sure Flumazenil is available in the pyxis when using this medication. If oversedation occurs, administer 0.2 mg IV over 15 sec. If after 45 sec no  response, administer 0.2 mg again over 1 min; may repeat at 1 min intervals; not to exceed 4 doses (1 mg)   sodium chloride flush (NS) 0.9 % injection 2 mL   ropivacaine (PF) 2 mg/mL (0.2%) (NAROPIN) injection 2 mL   dexamethasone (DECADRON) injection 10 mg   methylPREDNISolone acetate (DEPO-MEDROL) injection 40 mg   Medications administered: We administered iohexol, lidocaine, diazepam, sodium chloride flush, ropivacaine (PF) 2 mg/mL (0.2%), dexamethasone, and methylPREDNISolone acetate.  See the medical record for exact dosing, route, and time of administration.  Follow-up plan:   Return for Keep sch. appt.       Left L5-S1 ESI #1,  01/06/2021, 6 cc injected; left knee intra-articular steroid with Depo-Medrol 40 mg, caudal ESI 10/21/2022        Recent Visits Date Type Provider Dept  09/17/22 Office Visit Edward Jolly, MD [facility]  Showing recent visits within past 90 days and meeting all other requirements Today's Visits Date Type Provider Dept  10/21/22 Procedure visit Edward Jolly, MD [facility]  Showing today's visits and meeting all other requirements Future Appointments Date Type Provider Dept  12/24/22 Appointment Edward Jolly, MD [facility]  Showing future appointments within next 90 days and meeting all other requirements  Disposition: Discharge home  Discharge (Date  Time): 10/21/2022; 1150 hrs.   Primary Care Physician: Kandyce Rud, MD Location: [facility] Note by: Edward Jolly, MD Date: 10/21/2022; Time: 12:02 PM  Disclaimer:  Medicine is not an exact science. The only guarantee in medicine is that nothing is guaranteed. It is important to note that the decision to proceed with this intervention was based on the information collected from the patient. The Data and conclusions were drawn from the patient's questionnaire, the interview, and the physical examination. Because the information was  provided in large part by the patient, it cannot be guaranteed that it has not been purposely or unconsciously manipulated. Every effort has been made to obtain as  much relevant data as possible for this evaluation. It is important to note that the conclusions that lead to this procedure are derived in large part from the available data. Always take into account that the treatment will also be dependent on availability of resources and existing treatment guidelines, considered by other Pain Management Practitioners as being common knowledge and practice, at the time of the intervention. For Medico-Legal purposes, it is also important to point out that variation in procedural techniques and pharmacological choices are the acceptable norm. The indications, contraindications, technique, and results of the above procedure should only be interpreted and judged by a Board-Certified Interventional Pain Specialist with extensive familiarity and expertise in the same exact procedure and technique.

## 2022-10-21 NOTE — Progress Notes (Signed)
PROVIDER NOTE: Interpretation of information contained herein should be left to medically-trained personnel. Specific patient instructions are provided elsewhere under "Patient Instructions" section of medical record. This document was created in part using STT-dictation technology, any transcriptional errors that may result from this process are unintentional.  Patient: Emily Landry Type: Established DOB: 1951-10-23 MRN: [id number] PCP: Kandyce Rud, MD  Service: Procedure DOS: 10/21/2022 Setting: Ambulatory Location: Ambulatory outpatient facility Delivery: Face-to-face Provider: Edward Jolly, MD Specialty: Interventional Pain Management Specialty designation: 09 Location: Outpatient facility Ref. Prov.: Edward Jolly, MD    Primary Reason for Visit: Interventional Pain Management Treatment. CC: Back Pain (lower) and Knee Pain (left)   Procedure:           Type: Steroid Intra-articular Knee Injection #1  Laterality: Left (-LT) Level/approach: Medial Anesthesia: Local anesthesia (1-2% Lidocaine) Anxiolysis: Valium 5 mg DOS: 10/21/2022  Performed by: Edward Jolly, MD  Purpose: Diagnostic/Therapeutic Indications: Knee arthralgia associated to osteoarthritis of the knee Osteoarthritis of left knee  NAS-11 score:   Pre-procedure: 4 /10   Post-procedure: 4 /10     Pre-Procedure Preparation  Monitoring: As per clinic protocol.  Risk Assessment: Vitals:  [id number] body mass index is 31.79 kg/m as calculated from the following:   Height as of this encounter: 5\' 7"  (1.702 m).   Weight as of this encounter: 203 lb (92.1 kg)., Rate:69ECG Heart Rate: 80, BP:(!) 154/83, Resp:16, Temp:97.9 F (36.6 C), SpO2:96 %  Allergies: She has No Known Allergies.  Precautions: No additional precautions required  Blood-thinner(s): None at this time  Coagulopathies: Reviewed. None identified.   Active Infection(s): Reviewed. None identified. Ms. Scheck is afebrile   Location setting:  Exam room Position: Sitting w/ knee bent 90 degrees Safety Precautions: Patient was assessed for positional comfort and pressure points before starting the procedure. Prepping solution: DuraPrep (Iodine Povacrylex [0.7% available iodine] and Isopropyl Alcohol, 74% w/w) Prep Area: Entire knee region Approach: percutaneous, just above the tibial plateau, lateral to the infrapatellar tendon. Intended target: Intra-articular knee space Materials: Tray: Block Needle(s): Regular Qty: 1/side Length: 1.5-inch Gauge: 25G   Meds ordered this encounter  Medications   iohexol (OMNIPAQUE) 180 MG/ML injection 10 mL    Must be Myelogram-compatible. If not available, you may substitute with a water-soluble, non-ionic, hypoallergenic, myelogram-compatible radiological contrast medium.   lidocaine (XYLOCAINE) 2 % (with pres) injection 400 mg   diazepam (VALIUM) tablet 5 mg    Make sure Flumazenil is available in the pyxis when using this medication. If oversedation occurs, administer 0.2 mg IV over 15 sec. If after 45 sec no response, administer 0.2 mg again over 1 min; may repeat at 1 min intervals; not to exceed 4 doses (1 mg)   sodium chloride flush (NS) 0.9 % injection 2 mL   ropivacaine (PF) 2 mg/mL (0.2%) (NAROPIN) injection 2 mL   dexamethasone (DECADRON) injection 10 mg   methylPREDNISolone acetate (DEPO-MEDROL) injection 40 mg    Orders Placed This Encounter  Procedures   DG PAIN CLINIC C-ARM 1-60 MIN NO REPORT    Intraoperative interpretation by procedural physician at [facility].    Standing Status:   Standing    Number of Occurrences:   1    Order Specific Question:   Reason for exam:    Answer:   Assistance in needle guidance and placement for procedures requiring needle placement in or near specific anatomical locations not easily accessible without such assistance.     Time-out: 1136 I initiated and conducted the "Time-out"  before starting the procedure, as per protocol.  The patient was asked to participate by confirming the accuracy of the "Time Out" information. Verification of the correct person, site, and procedure were performed and confirmed by me, the nursing staff, and the patient. "Time-out" conducted as per Joint Commission's Universal Protocol (UP.01.01.01). Procedure checklist: Completed   H&P (Pre-op  Assessment)  Ms. Guilbert is a 71 y.o. (year old), female patient, seen today for interventional treatment. She  has a past surgical history that includes Laminectomy (1971); Lumbar disc surgery (1991); Tonsillectomy; Cholecystectomy; Abdominal hysterectomy; Colonoscopy; and Lumbar laminectomy/decompression microdiscectomy (N/A, 05/20/2022). Ms. Worsley has a current medication list which includes the following prescription(s): bupropion, duloxetine, folic acid, gabapentin, methocarbamol, methotrexate, oxycodone-acetaminophen, [START ON 10/30/2022] oxycodone-acetaminophen, and [START ON 11/29/2022] oxycodone-acetaminophen. Her primarily concern today is the Back Pain (lower) and Knee Pain (left)  She has No Known Allergies.   Last encounter: My last encounter with her was on 09/17/2022. Pertinent problems: Ms. Peddy has Chronic pain syndrome; Lumbar post-laminectomy syndrome; Lumbar radiculopathy; Chronic pain of both shoulders; PTSD (post-traumatic stress disorder); Plantar fasciitis of right foot; and Intercostal neuralgia on their pertinent problem list. Pain Assessment: Severity of Chronic pain is reported as a 4 /10. Location: Back Lower/denies. Onset: More than a month ago. Quality: Other (Comment) (grabbing). Timing: Constant. Modifying factor(s): meds. Vitals:  height is 5\' 7"  (1.702 m) and weight is 203 lb (92.1 kg). Her temporal temperature is 97.9 F (36.6 C). Her blood pressure is 155/97 (abnormal) and her pulse is 69. Her respiration is 16 and oxygen saturation is 95%.   Reason for encounter: "interventional pain management therapy due pain of at  least four (4) weeks in duration, with failure to respond and/or inability to tolerate more conservative care.   Site Confirmation: Ms. Zwicker was asked to confirm the procedure and laterality before marking the site.  Consent: Before the procedure and under the influence of no sedative(s), amnesic(s), or anxiolytics, the patient was informed of the treatment options, risks and possible complications. To fulfill our ethical and legal obligations, as recommended by the American Medical Association's Code of Ethics, I have informed the patient of my clinical impression; the nature and purpose of the treatment or procedure; the risks, benefits, and possible complications of the intervention; the alternatives, including doing nothing; the risk(s) and benefit(s) of the alternative treatment(s) or procedure(s); and the risk(s) and benefit(s) of doing nothing. The patient was provided information about the general risks and possible complications associated with the procedure. These may include, but are not limited to: failure to achieve desired goals, infection, bleeding, organ or nerve damage, allergic reactions, paralysis, and death. In addition, the patient was informed of those risks and complications associated to Spine-related procedures, such as failure to decrease pain; infection (i.e.: Meningitis, epidural or intraspinal abscess); bleeding (i.e.: epidural hematoma, subarachnoid hemorrhage, or any other type of intraspinal or peri-dural bleeding); organ or nerve damage (i.e.: Any type of peripheral nerve, nerve root, or spinal cord injury) with subsequent damage to sensory, motor, and/or autonomic systems, resulting in permanent pain, numbness, and/or weakness of one or several areas of the body; allergic reactions; (i.e.: anaphylactic reaction); and/or death. Furthermore, the patient was informed of those risks and complications associated with the medications. These include, but are not limited to:  allergic reactions (i.e.: anaphylactic or anaphylactoid reaction(s)); adrenal axis suppression; blood sugar elevation that in diabetics may result in ketoacidosis or comma; water retention that in patients with history of congestive heart failure may  result in shortness of breath, pulmonary edema, and decompensation with resultant heart failure; weight gain; swelling or edema; medication-induced neural toxicity; particulate matter embolism and blood vessel occlusion with resultant organ, and/or nervous system infarction; and/or aseptic necrosis of one or more joints. Finally, the patient was informed that Medicine is not an exact science; therefore, there is also the possibility of unforeseen or unpredictable risks and/or possible complications that may result in a catastrophic outcome. The patient indicated having understood very clearly. We have given the patient no guarantees and we have made no promises. Enough time was given to the patient to ask questions, all of which were answered to the patient's satisfaction. Ms. Picha has indicated that she wanted to continue with the procedure. Attestation: I, the ordering provider, attest that I have discussed with the patient the benefits, risks, side-effects, alternatives, likelihood of achieving goals, and potential problems during recovery for the procedure that I have provided informed consent.  Date  Time: 10/21/2022 10:34 AM   Prophylactic antibiotics  Anti-infectives (From admission, onward)    None      Indication(s): None identified   Description of procedure   Start Time: 1136 hrs  Local Anesthesia: Once the patient was positioned, prepped, and time-out was completed. The target area was identified located. The skin was marked with an approved surgical skin marker. Once marked, the skin (epidermis, dermis, and hypodermis), and deeper tissues (fat, connective tissue and muscle) were infiltrated with a small amount of a short-acting local  anesthetic, loaded on a 10cc syringe with a 25G, 1.5-in  Needle. An appropriate amount of time was allowed for local anesthetics to take effect before proceeding to the next step. Local Anesthetic: Lidocaine 1-2% The unused portion of the local anesthetic was discarded in the proper designated containers. Safety Precautions: Aspiration looking for blood return was conducted prior to all injections. At no point did I inject any substances, as a needle was being advanced. Before injecting, the patient was told to immediately notify me if she was experiencing any new onset of "ringing in the ears, or metallic taste in the mouth". No attempts were made at seeking any paresthesias. Safe injection practices and needle disposal techniques used. Medications properly checked for expiration dates. SDV (single dose vial) medications used. After the completion of the procedure, all disposable equipment used was discarded in the proper designated medical waste containers.  Technical description: Protocol guidelines were followed. After positioning, the target area was identified and prepped in the usual manner. Skin & deeper tissues infiltrated with local anesthetic. Appropriate amount of time allowed to pass for local anesthetics to take effect. Proper needle placement secured. Once satisfactory needle placement was confirmed, I proceeded to inject the desired solution in slow, incremental fashion, intermittently assessing for discomfort or any signs of abnormal or undesired spread of substance. Once completed, the needle was removed and disposed of, as per hospital protocols. The area was cleaned, making sure to leave some of the prepping solution back to take advantage of its long term bactericidal properties.  Aspiration:  Negative         5 cc solution made of 4 cc of 0.2% ropivacaine, 1 cc of methylprednisolone, 40 mg/cc.  Injected into the left knee joint. Vitals:   10/21/22 1058 10/21/22 1123 10/21/22 1136  10/21/22 1138  BP: (!) 154/83 (!) 154/98 (!) 159/92 (!) 155/97  Pulse: 69     Resp: 16 18 18 16   Temp: 97.9 F (36.6 C)  TempSrc: Temporal     SpO2: 96% 98% 95% 95%  Weight: 203 lb (92.1 kg)     Height: 5\' 7"  (1.702 m)       End Time: 1138 hrs   Imaging guidance  Imaging-assisted Technique: None required. Indication(s): N/A Exposure Time: N/A Contrast: None Fluoroscopic Guidance: N/A Ultrasound Guidance: N/A Interpretation: N/A   Post-op assessment  Post-procedure Vital Signs:  Pulse/HCG Rate: 6975 Temp: 97.9 F (36.6 C) Resp: 16 BP: (!) 155/97 SpO2: 95 %  EBL: None  Complications: No immediate post-treatment complications observed by team, or reported by patient.  Note: The patient tolerated the entire procedure well. A repeat set of vitals were taken after the procedure and the patient was kept under observation following institutional policy, for this type of procedure. Post-procedural neurological assessment was performed, showing return to baseline, prior to discharge. The patient was provided with post-procedure discharge instructions, including a section on how to identify potential problems. Should any problems arise concerning this procedure, the patient was given instructions to immediately contact , at any time, without hesitation. In any case, we plan to contact the patient by telephone for a follow-up status report regarding this interventional procedure.  Comments:  No additional relevant information.   Plan of care  Chronic Opioid Analgesic:   Percocet  10 mg every 8 hours as needed.  MME 45     Medications administered: We administered iohexol, lidocaine, diazepam, sodium chloride flush, ropivacaine (PF) 2 mg/mL (0.2%), dexamethasone, and methylPREDNISolone acetate.  Follow-up plan:   Return for Keep [facility]. appt.      Left L5-S1 ESI #1,  01/06/2021, 6 cc injected, left knee intra-articular steroid            Recent Visits Date Type Provider  Dept  09/17/22 Office Visit 11/17/22, MD [facility]  Showing recent visits within past 90 days and meeting all other requirements Today's Visits Date Type Provider Dept  10/21/22 Procedure visit 13/08/23, MD [facility]  Showing today's visits and meeting all other requirements Future Appointments Date Type Provider Dept  12/24/22 Appointment 02/22/23, MD [facility]  Showing future appointments within next 90 days and meeting all other requirements   Disposition: Discharge home  Discharge (Date  Time): 10/21/2022; 1150 hrs.   Primary Care Physician: 13/07/2022, MD Location: [facility] Note by: [facility], MD Date: 10/21/2022; Time: 12:00 PM  DISCLAIMER: Medicine is not an exact science. It has no guarantees or warranties. The decision to proceed with this intervention was based on the information collected from the patient. Conclusions were drawn from the patient's questionnaire, interview, and examination. Because information was provided in large part by the patient, it cannot be guaranteed that it has not been purposely or unconsciously manipulated or altered. Every effort has been made to obtain as much accurate, relevant, available data as possible. Always take into account that the treatment will also be dependent on availability of resources and existing treatment guidelines, considered by other Pain Management Specialists as being common knowledge and practice, at the time of the intervention. It is also important to point out that variation in procedural techniques and pharmacological choices are the acceptable norm. For Medico-Legal review purposes, the indications, contraindications, technique, and results of the these procedures should only be evaluated, judged and interpreted by a Board-Certified Interventional Pain Specialist with extensive familiarity and expertise in the same exact procedure and  technique.

## 2022-10-21 NOTE — Progress Notes (Signed)
Safety precautions to be maintained throughout the outpatient stay will include: orient to surroundings, keep bed in low position, maintain call bell within reach at all times, provide assistance with transfer out of bed and ambulation.  

## 2022-10-22 ENCOUNTER — Telehealth: Payer: Self-pay

## 2022-10-28 ENCOUNTER — Telehealth: Payer: Self-pay

## 2022-10-28 ENCOUNTER — Ambulatory Visit: Payer: Medicare PPO

## 2022-10-28 NOTE — Telephone Encounter (Signed)
She sent a mychart message that her scripts were not at the pharmacy. She was seen last month and he said to return in 3 months. Pharmacy is telling her she has none.

## 2022-10-28 NOTE — Telephone Encounter (Signed)
Called pharmacy and stayed on hold for over 10 minutes. Called patient and she states that they found the scripts.

## 2022-12-02 ENCOUNTER — Telehealth: Payer: Medicare PPO | Admitting: Student in an Organized Health Care Education/Training Program

## 2022-12-10 ENCOUNTER — Other Ambulatory Visit: Payer: Self-pay | Admitting: Psychiatry

## 2022-12-10 ENCOUNTER — Other Ambulatory Visit: Payer: Self-pay | Admitting: Gastroenterology

## 2022-12-24 ENCOUNTER — Ambulatory Visit
Payer: Medicare HMO | Attending: Student in an Organized Health Care Education/Training Program | Admitting: Student in an Organized Health Care Education/Training Program

## 2022-12-24 ENCOUNTER — Encounter: Payer: Self-pay | Admitting: Student in an Organized Health Care Education/Training Program

## 2022-12-24 VITALS — BP 152/81 | HR 74 | Temp 97.2°F | Ht 68.0 in | Wt 205.0 lb

## 2022-12-24 DIAGNOSIS — G8929 Other chronic pain: Secondary | ICD-10-CM | POA: Diagnosis not present

## 2022-12-24 DIAGNOSIS — Z1231 Encounter for screening mammogram for malignant neoplasm of breast: Secondary | ICD-10-CM | POA: Diagnosis not present

## 2022-12-24 DIAGNOSIS — F4310 Post-traumatic stress disorder, unspecified: Secondary | ICD-10-CM | POA: Insufficient documentation

## 2022-12-24 DIAGNOSIS — M961 Postlaminectomy syndrome, not elsewhere classified: Secondary | ICD-10-CM | POA: Insufficient documentation

## 2022-12-24 DIAGNOSIS — G894 Chronic pain syndrome: Secondary | ICD-10-CM | POA: Diagnosis not present

## 2022-12-24 DIAGNOSIS — M533 Sacrococcygeal disorders, not elsewhere classified: Secondary | ICD-10-CM | POA: Insufficient documentation

## 2022-12-24 DIAGNOSIS — M722 Plantar fascial fibromatosis: Secondary | ICD-10-CM | POA: Insufficient documentation

## 2022-12-24 DIAGNOSIS — M5416 Radiculopathy, lumbar region: Secondary | ICD-10-CM | POA: Insufficient documentation

## 2022-12-24 DIAGNOSIS — M1712 Unilateral primary osteoarthritis, left knee: Secondary | ICD-10-CM | POA: Diagnosis not present

## 2022-12-24 MED ORDER — OXYCODONE-ACETAMINOPHEN 10-325 MG PO TABS: 1.0000 | ORAL_TABLET | Freq: Three times a day (TID) | ORAL | 0 refills | Status: AC | PRN

## 2022-12-24 MED ORDER — OXYCODONE-ACETAMINOPHEN 10-325 MG PO TABS: 1.0000 | ORAL_TABLET | Freq: Three times a day (TID) | ORAL | 0 refills | Status: DC | PRN

## 2022-12-24 NOTE — Progress Notes (Signed)
Safety precautions to be maintained throughout the outpatient stay will include: orient to surroundings, keep bed in low position, maintain call bell within reach at all times, provide assistance with transfer out of bed and ambulation.   Nursing Pain Medication Assessment:  Safety precautions to be maintained throughout the outpatient stay will include: orient to surroundings, keep bed in low position, maintain call bell within reach at all times, provide assistance with transfer out of bed and ambulation.  Medication Inspection Compliance: Pill count conducted under aseptic conditions, in front of the patient. Neither the pills nor the bottle was removed from the patient's sight at any time. Once count was completed pills were immediately returned to the patient in their original bottle.  Medication: Oxycodone/APAP Pill/Patch Count:  15 of 90 pills remain Pill/Patch Appearance: Markings consistent with prescribed medication Bottle Appearance: Standard pharmacy container. Clearly labeled. Filled Date: 32 / 17 / 2023 Last Medication intake:  Today

## 2022-12-24 NOTE — Progress Notes (Signed)
PROVIDER NOTE: Information contained herein reflects review and annotations entered in association with encounter. Interpretation of such information and data should be left to medically-trained personnel. Information provided to patient can be located elsewhere in the medical record under "Patient Instructions". Document created using STT-dictation technology, any transcriptional errors that may result from process are unintentional.    Patient: Emily Landry  Service Category: E/M  Provider: Edward Jolly, MD  DOB: [birth year]  DOS: 12/24/2022  Specialty: Interventional Pain Management  MRN: [id number]  Setting: Ambulatory outpatient  PCP: Kandyce Rud, MD  Type: Established Patient    Referring Provider: Kandyce Rud, MD  Location: Office  Delivery: Face-to-face     HPI  Emily Landry, a 72 y.o. year old female, is here today because of her Chronic radicular lumbar pain [M54.16, G89.29]. Emily Landry primary complain today is Neck Pain (bilateral) Last encounter: My last encounter with her was on 10/21/22 Pertinent problems: Emily Landry has Chronic pain syndrome; Lumbar post-laminectomy syndrome; Lumbar radiculopathy; Chronic pain of both shoulders; PTSD (post-traumatic stress disorder); Plantar fasciitis of right foot; and Intercostal neuralgia on their pertinent problem list. Pain Assessment: Severity of Chronic pain is reported as a 3 /10. Location: Neck Right, Left/radiates down both arms to hands. Onset: More than a month ago. Quality: Constant, Sharp, Tingling. Timing: Constant. Modifying factor(s): reposition. Vitals:  height is 5\' 8"  (1.727 m) and weight is 205 lb (93 kg). Her temporal temperature is 97.2 F (36.2 C) (abnormal). Her blood pressure is 152/81 (abnormal) and her pulse is 74. Her oxygen saturation is 99%.   Reason for encounter: both, medication management and post-procedure evaluation and assessment  Patient notes good benefit after her caudal ESI as well as her  left knee intra-articular steroid injection that was done 10/21/2022.  She is pleased with the results.  Post-procedure evaluation   Procedure: Caudal Epidural Steroid Injection #1, left knee intra-articular steroid injection Laterality: Midline         Level: Sacrococcygeal ligament  Imaging: Fluoroscopy-guided         Anesthesia: Local anesthesia (1-2% Lidocaine) Anxiolysis: Valium 5 mg p.o. DOS: 10/21/2022  Performed by: 13/07/2022, MD  Purpose: Diagnostic/Therapeutic Indications: Low back and lower extremity pain severe enough to impact quality of life or function. Rationale (medical necessity): procedure needed and proper for the diagnosis and/or treatment of Emily Landry's medical symptoms and needs. Coccydynia, lumbar radicular pain  NAS-11 Pain score:   Pre-procedure: 4 /10   Post-procedure: 4 /10      Effectiveness:  Initial hour after procedure: 100 %  Subsequent 4-6 hours post-procedure: 100 %  Analgesia past initial 6 hours: 70 %  Ongoing improvement:  Analgesic:  70% Function: Somewhat improved ROM: Somewhat improved   Today we will be refilling her oxycodone which she takes 10 mg every 8 hours as needed, dose will be unchanged.  She has been compliant with therapy.  We have discussed her prior urine toxicology screen which was positive positive for THC albeit at a very low level and patient indicated that she was utilizing CBD.  Otherwise, patient continues gabapentin and Cymbalta as prescribed as a part of her multimodal pain regimen.  No refills needed.  Pharmacotherapy Assessment  Analgesic:  Percocet  10 mg every 8 hours as needed.  MME 45     Monitoring: [place] PMP: PDMP reviewed during this encounter.       Pharmacotherapy: No side-effects or adverse reactions reported. Compliance: No problems identified. Effectiveness: Clinically acceptable.  Edward Jolly  J, RN  12/24/2022 10:24 AM  Sign when Signing Visit Safety precautions to be maintained  throughout the outpatient stay will include: orient to surroundings, keep bed in low position, maintain call bell within reach at all times, provide assistance with transfer out of bed and ambulation.   Nursing Pain Medication Assessment:  Safety precautions to be maintained throughout the outpatient stay will include: orient to surroundings, keep bed in low position, maintain call bell within reach at all times, provide assistance with transfer out of bed and ambulation.  Medication Inspection Compliance: Pill count conducted under aseptic conditions, in front of the patient. Neither the pills nor the bottle was removed from the patient's sight at any time. Once count was completed pills were immediately returned to the patient in their original bottle.  Medication: Oxycodone/APAP Pill/Patch Count:  15 of 90 pills remain Pill/Patch Appearance: Markings consistent with prescribed medication Bottle Appearance: Standard pharmacy container. Clearly labeled. Filled Date: 18 / 17 / 2023 Last Medication intake:  Today    UDS:  Summary  Date Value Ref Range Status  03/26/2022 Note  Final    Comment:    ==================================================================== [employer] Select 13 (MW) ==================================================================== Test                             Result       Flag       Units  Drug Present and Declared for Prescription Verification   Morphine                       >10204       EXPECTED   ng/mg creat   Normorphine                    409          EXPECTED   ng/mg creat    Potential sources of large amounts of morphine in the absence of    codeine include administration of morphine or use of heroin.     Normorphine is an expected metabolite of morphine.    Hydromorphone                  72           EXPECTED   ng/mg creat    Hydromorphone may be present as a metabolite of morphine;    concentrations of hydromorphone rarely exceed 5% of the  morphine    concentration when this is the source of hydromorphone.    Oxycodone                      1493         EXPECTED   ng/mg creat   Oxymorphone                    155          EXPECTED   ng/mg creat   Noroxycodone                   4240         EXPECTED   ng/mg creat    Sources of oxycodone include scheduled prescription medications.    Oxymorphone and noroxycodone are expected metabolites of oxycodone.    Oxymorphone is also available as a scheduled prescription medication.  Drug Present not Declared for Prescription Verification   Carboxy-THC  15           UNEXPECTED ng/mg creat    Carboxy-THC is a metabolite of tetrahydrocannabinol (THC). Source of    THC is most commonly herbal marijuana or marijuana-based products,    but THC is also present in a scheduled prescription medication.    Trace amounts of THC can be present in hemp and cannabidiol (CBD)    products. This test is not intended to distinguish between delta-9-    tetrahydrocannabinol, the predominant form of THC in most herbal or    marijuana-based products, and delta-8-tetrahydrocannabinol.  ==================================================================== Test                      Result    Flag   Units      Ref Range   Creatinine              98               mg/dL      >=20 ==================================================================== Declared Medications:  The flagging and interpretation on this report are based on the  following declared medications.  Unexpected results may arise from  inaccuracies in the declared medications.   **Note: The testing scope of this panel includes these medications:   Morphine (MS Contin)  Oxycodone (Percocet)   **Note: The testing scope of this panel does not include the  following reported medications:   Acetaminophen (Percocet)  Bupropion (Wellbutrin)  Cholecalciferol  Duloxetine (Cymbalta)  Folic Acid  Gabapentin (Neurontin)   Methotrexate  Omeprazole (Prilosec)  Triamcinolone (Kenalog) ==================================================================== For clinical consultation, please call [phone]. ====================================================================      ROS  Constitutional: Denies any fever or chills Gastrointestinal: No reported hemesis, hematochezia, vomiting, or acute GI distress Musculoskeletal:  neck pain, mild Neurological: No reported episodes of acute onset apraxia, aphasia, dysarthria, agnosia, amnesia, paralysis, loss of coordination, or loss of consciousness  Medication Review  DULoxetine, buPROPion, folic acid, gabapentin, ibandronate, methotrexate, oxyCODONE-acetaminophen, and pantoprazole  History Review  Allergy: Emily Landry has No Known Allergies. Drug: Emily Landry  reports no history of drug use. Alcohol:  reports current alcohol use. Tobacco:  reports that she has quit smoking. Her smoking use included cigarettes. She has never used smokeless tobacco. Social: Emily Landry  reports that she has quit smoking. Her smoking use included cigarettes. She has never used smokeless tobacco. She reports current alcohol use. She reports that she does not use drugs. Medical:  has a past medical history of Anxiety, Arthritis, Chronic back pain, Depression, Elevated blood pressure reading, GERD (gastroesophageal reflux disease), History of hiatal hernia, PONV (postoperative nausea and vomiting), PTSD (post-traumatic stress disorder), and Refusal of blood transfusions as patient is Jehovah's Witness. Surgical: Emily Landry  has a past surgical history that includes Laminectomy (1971); Lumbar disc surgery (1991); Tonsillectomy; Cholecystectomy; Abdominal hysterectomy; Colonoscopy; and Lumbar laminectomy/decompression microdiscectomy (N/A, 05/20/2022). Family: family history includes Alcohol abuse in her father; Arthritis in her father; Breast cancer (age of onset: 34) in her mother;  Diabetes in her mother; Gout in her father; Rheum arthritis in her mother.  Laboratory Chemistry Profile   Renal Lab Results  Component Value Date   BUN 13 05/14/2022   CREATININE 0.44 05/20/2022   GFRNONAA >60 05/20/2022    Hepatic Lab Results  Component Value Date   AST 31 05/21/2020   ALT 22 05/21/2020   ALBUMIN 4.2 05/21/2020   ALKPHOS 71 05/21/2020    Electrolytes Lab Results  Component Value Date  NA 140 05/14/2022   K 3.6 05/14/2022   CL 107 05/14/2022   CALCIUM 9.0 05/14/2022    Bone No results found for: "VD25OH", "VD125OH2TOT", "[id number]", "UK:060616", "25OHVITD1", "25OHVITD2", "25OHVITD3", "TESTOFREE", "TESTOSTERONE"  Inflammation (CRP: Acute Phase) (ESR: Chronic Phase) Lab Results  Component Value Date   ESRSEDRATE 6 12/06/2019         Note: Above Lab results reviewed.  Physical Exam  General appearance: Well nourished, well developed, and well hydrated. In no apparent acute distress Mental status: Alert, oriented x 3 (person, place, & time)       Respiratory: No evidence of acute respiratory distress Eyes: PERLA Vitals: BP (!) 152/81 (BP Location: Right Arm, Patient Position: Sitting, Cuff Size: Large)   Pulse 74   Temp (!) 97.2 F (36.2 C) (Temporal)   Ht 5\' 8"  (1.727 m)   Wt 205 lb (93 kg)   SpO2 99%   BMI 31.17 kg/m  BMI: Estimated body mass index is 31.17 kg/m as calculated from the following:   Height as of this encounter: 5\' 8"  (1.727 m).   Weight as of this encounter: 205 lb (93 kg). Ideal: Ideal body weight: 63.9 kg (140 lb 14 oz) Adjusted ideal body weight: 75.5 kg (166 lb 8.4 oz)  Cervicalgia  Assessment   Diagnosis Status  1. Chronic radicular lumbar pain   2. Coccydynia   3. Primary osteoarthritis of left knee   4. Lumbar radiculopathy   5. Plantar fasciitis of right foot   6. PTSD (post-traumatic stress disorder)   7. Chronic pain syndrome   8. Lumbar post-laminectomy syndrome     Controlled Controlled Controlled     Plan of Care    Emily Landry has a current medication list which includes the following long-term medication(s): bupropion, duloxetine, and gabapentin.  Pharmacotherapy (Medications Ordered): Meds ordered this encounter  Medications   oxyCODONE-acetaminophen (PERCOCET) 10-325 MG tablet    Sig: Take 1 tablet by mouth every 8 (eight) hours as needed for pain. Must last 30 days.    Dispense:  90 tablet    Refill:  0    Chronic Pain: STOP Act (Not applicable) Fill 1 day early if closed on refill date. Avoid benzodiazepines within 8 hours of opioids   oxyCODONE-acetaminophen (PERCOCET) 10-325 MG tablet    Sig: Take 1 tablet by mouth every 8 (eight) hours as needed for pain. Must last 30 days.    Dispense:  90 tablet    Refill:  0    Chronic Pain: STOP Act (Not applicable) Fill 1 day early if closed on refill date. Avoid benzodiazepines within 8 hours of opioids   oxyCODONE-acetaminophen (PERCOCET) 10-325 MG tablet    Sig: Take 1 tablet by mouth every 8 (eight) hours as needed for pain. Must last 30 days.    Dispense:  90 tablet    Refill:  0    Chronic Pain: STOP Act (Not applicable) Fill 1 day early if closed on refill date. Avoid benzodiazepines within 8 hours of opioids  Continue gabapentin and Cymbalta as prescribed, no refills needed Repeat caudal, left knee steroid injection as needed.  Follow-up plan:   Return in about 12 weeks (around 03/18/2023) for Medication Management, in person.    Recent Visits Date Type Provider Dept  10/21/22 Procedure visit Gillis Santa, MD [facility]  Showing recent visits within past 90 days and meeting all other requirements Today's Visits Date Type Provider Dept  12/24/22 Office Visit Gillis Santa, MD [facility]  Showing today's visits and meeting all other requirements Future Appointments Date Type Provider Dept  03/11/23 Appointment Gillis Santa, MD [facility]  Showing future appointments within  next 90 days and meeting all other requirements  I discussed the assessment and treatment plan with the patient. The patient was provided an opportunity to ask questions and all were answered. The patient agreed with the plan and demonstrated an understanding of the instructions.  Patient advised to call back or seek an in-person evaluation if the symptoms or condition worsens.  Duration of encounter: 38minutes.  Total time on encounter, as per AMA guidelines included both the face-to-face and non-face-to-face time personally spent by the physician and/or other qualified health care professional(s) on the day of the encounter (includes time in activities that require the physician or other qualified health care professional and does not include time in activities normally performed by clinical staff). Physician's time may include the following activities when performed: preparing to see the patient (eg, review of tests, pre-charting review of records) obtaining and/or reviewing separately obtained history performing a medically appropriate examination and/or evaluation counseling and educating the patient/family/caregiver ordering medications, tests, or procedures referring and communicating with other health care professionals (when not separately reported) documenting clinical information in the electronic or other health record independently interpreting results (not separately reported) and communicating results to the patient/ family/caregiver care coordination (not separately reported)  Note by: Gillis Santa, MD Date: 12/24/2022; Time: 11:07 AM

## 2022-12-26 IMAGING — CR DG SI JOINTS 3+V
1 series · 3 of 3 positions shown · non-contrast
Comparison: None.

CLINICAL DATA: Pain

EXAM:
BILATERAL SACROILIAC JOINTS - 3+ VIEW

[Series 1: dg si joints · 0.14mm/px · 3 of 3 slices shown]
[im 1/3]
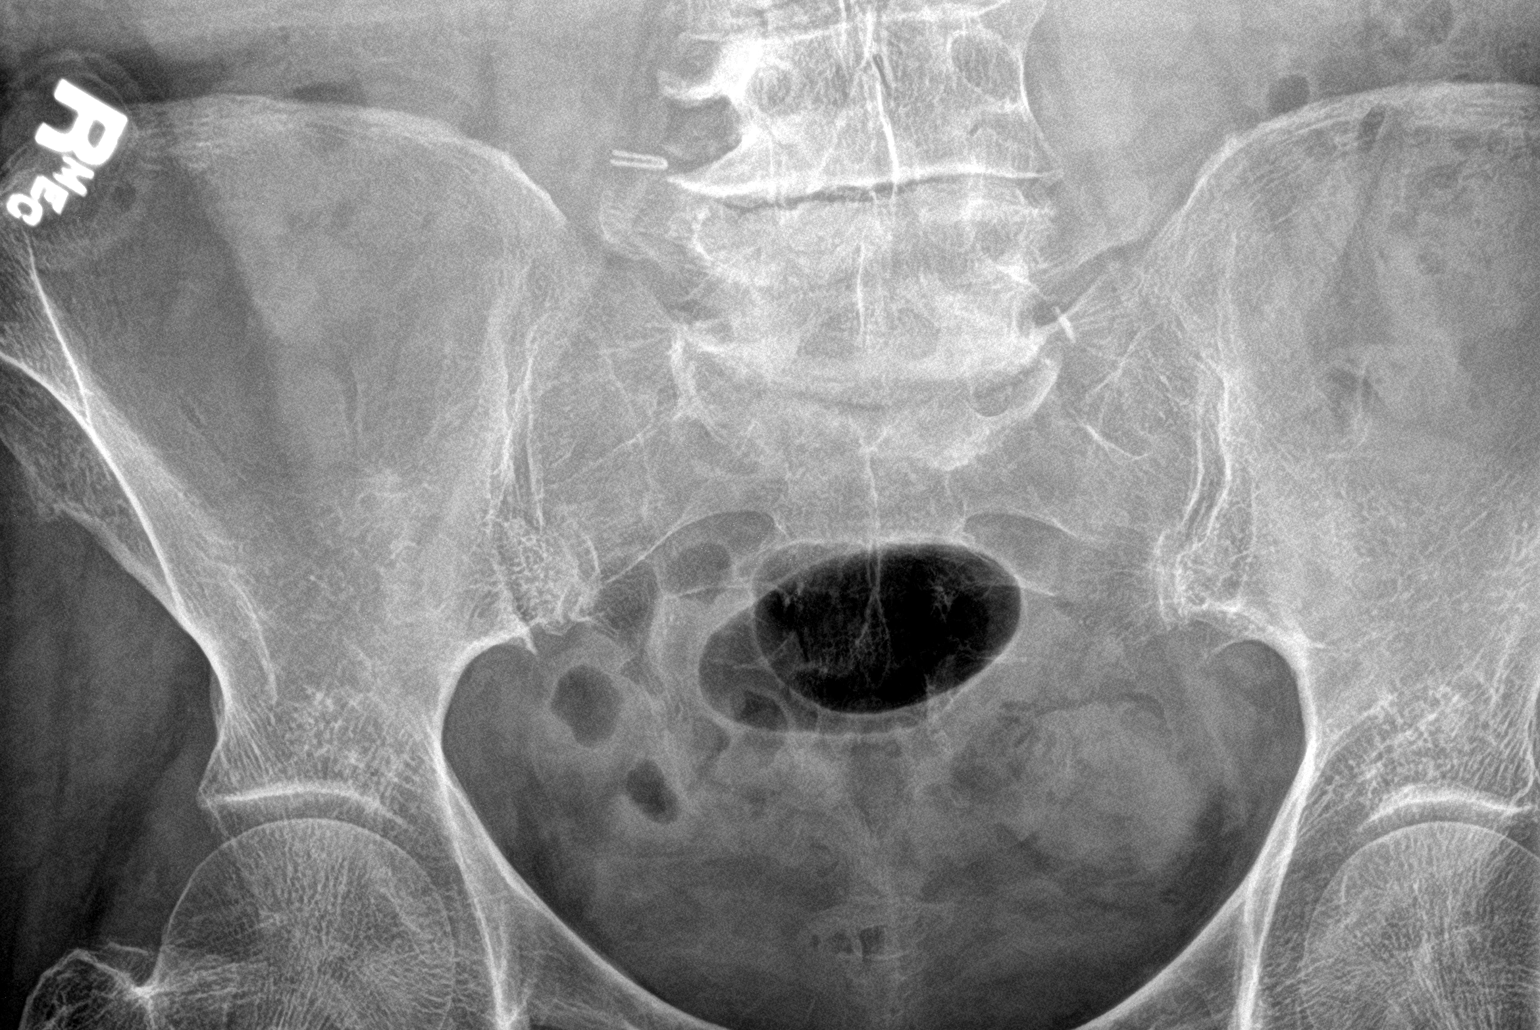
[im 2/3]
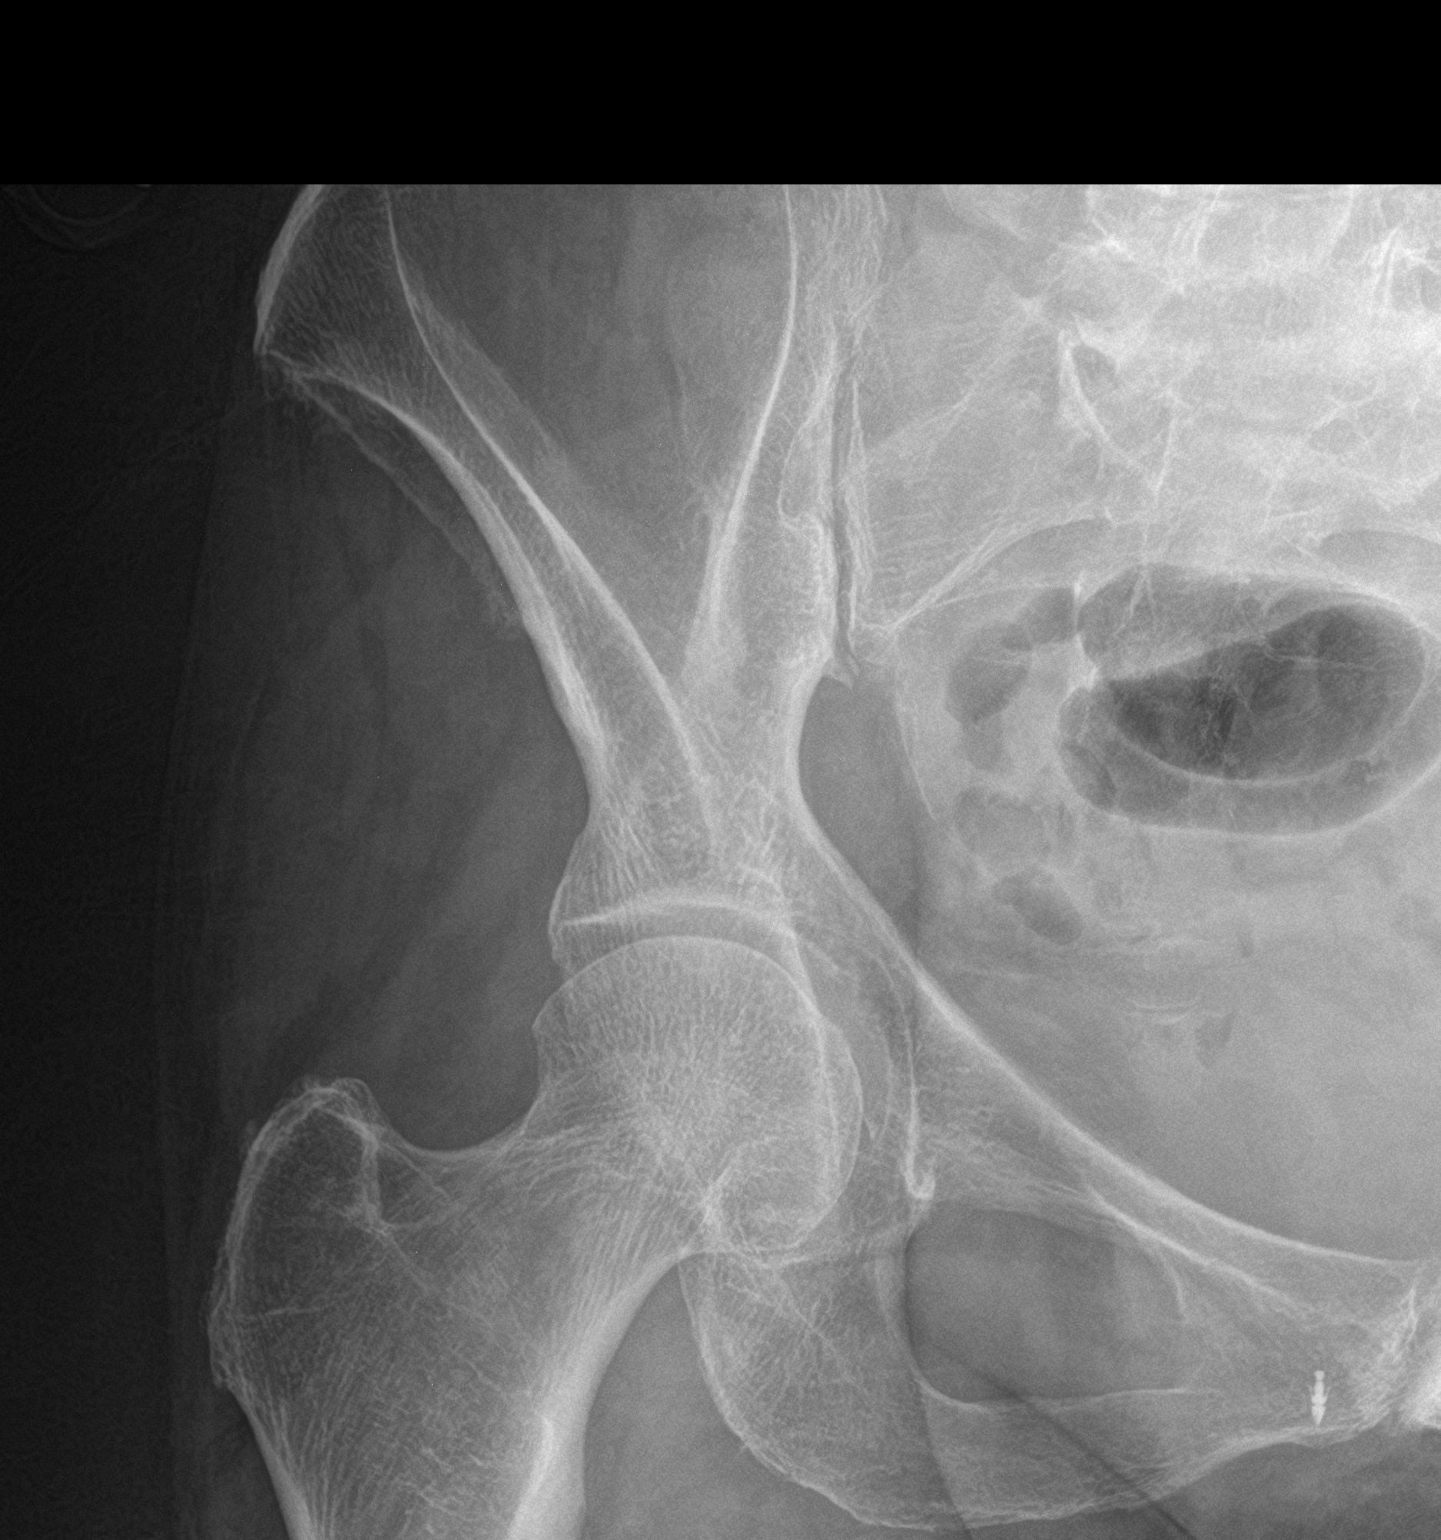
[im 3/3]
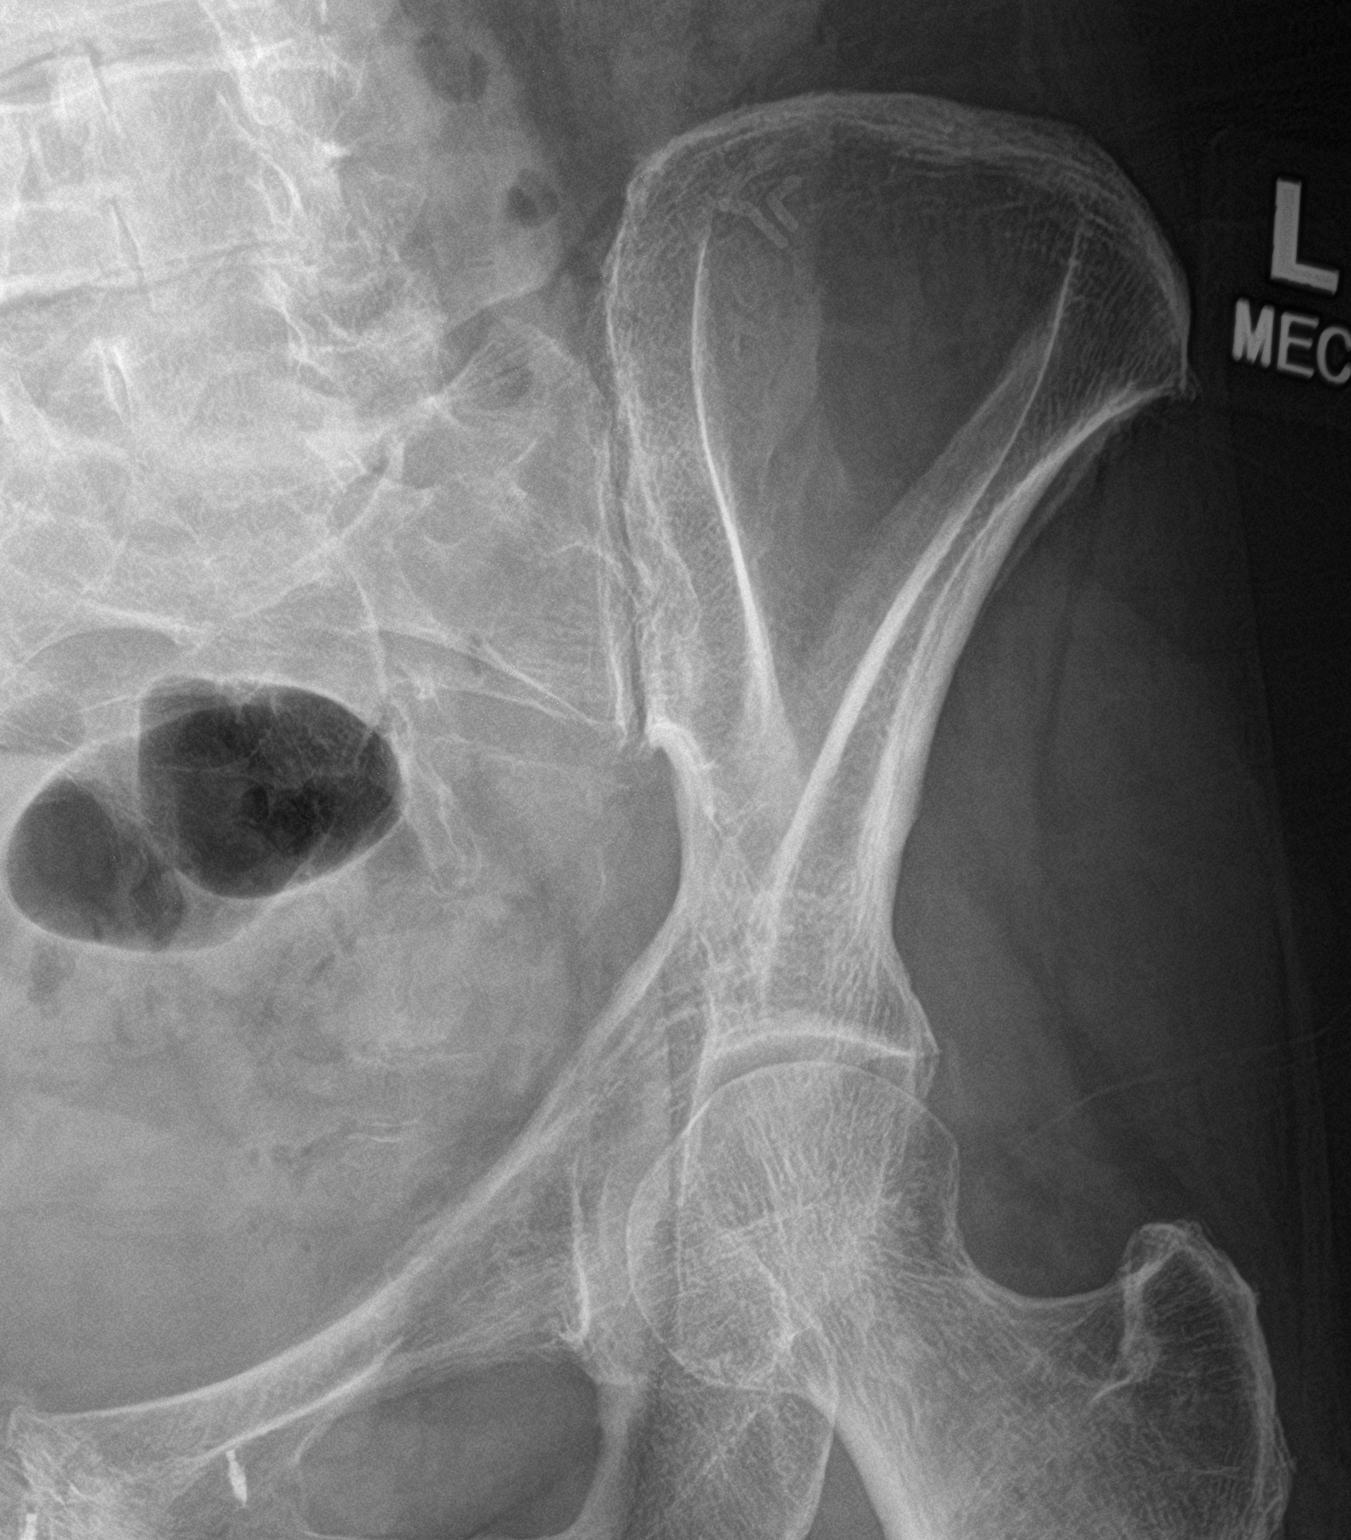

[3 of 3 positions shown; findings below may reference images not displayed]

FINDINGS: The sacroiliac joint spaces are maintained and there is no evidence
of arthropathy. Degenerative changes are noted in the visualized
lower lumbar spine.
IMPRESSION: No significant radiographic abnormality is seen in the SI joints.

Lumbar spondylosis.

## 2022-12-26 IMAGING — CR DG HIP (WITH OR WITHOUT PELVIS) 2-3V*R*
1 series · 4 of 4 positions shown · non-contrast
Comparison: None.

CLINICAL DATA: Pain right hip

EXAM:
DG HIP (WITH OR WITHOUT PELVIS) 2-3V RIGHT

[Series 1: dg hip unilat w or w/o pelvis 2-3 views  · non-contrast · 0.14mm/px · 4 of 4 slices shown]
[im 1/4]
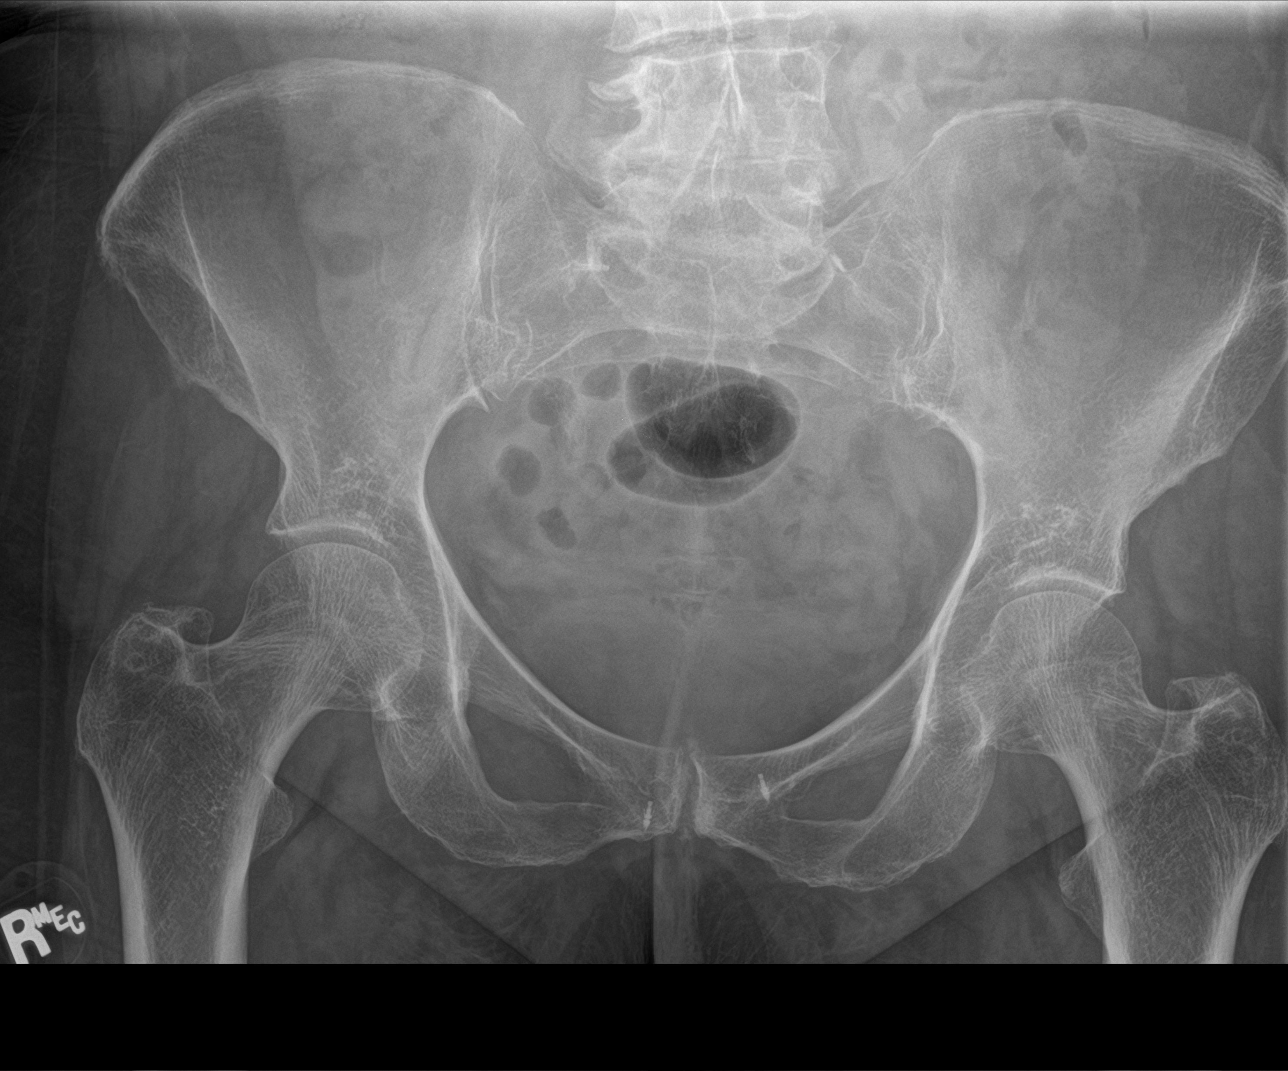
[im 2/4]
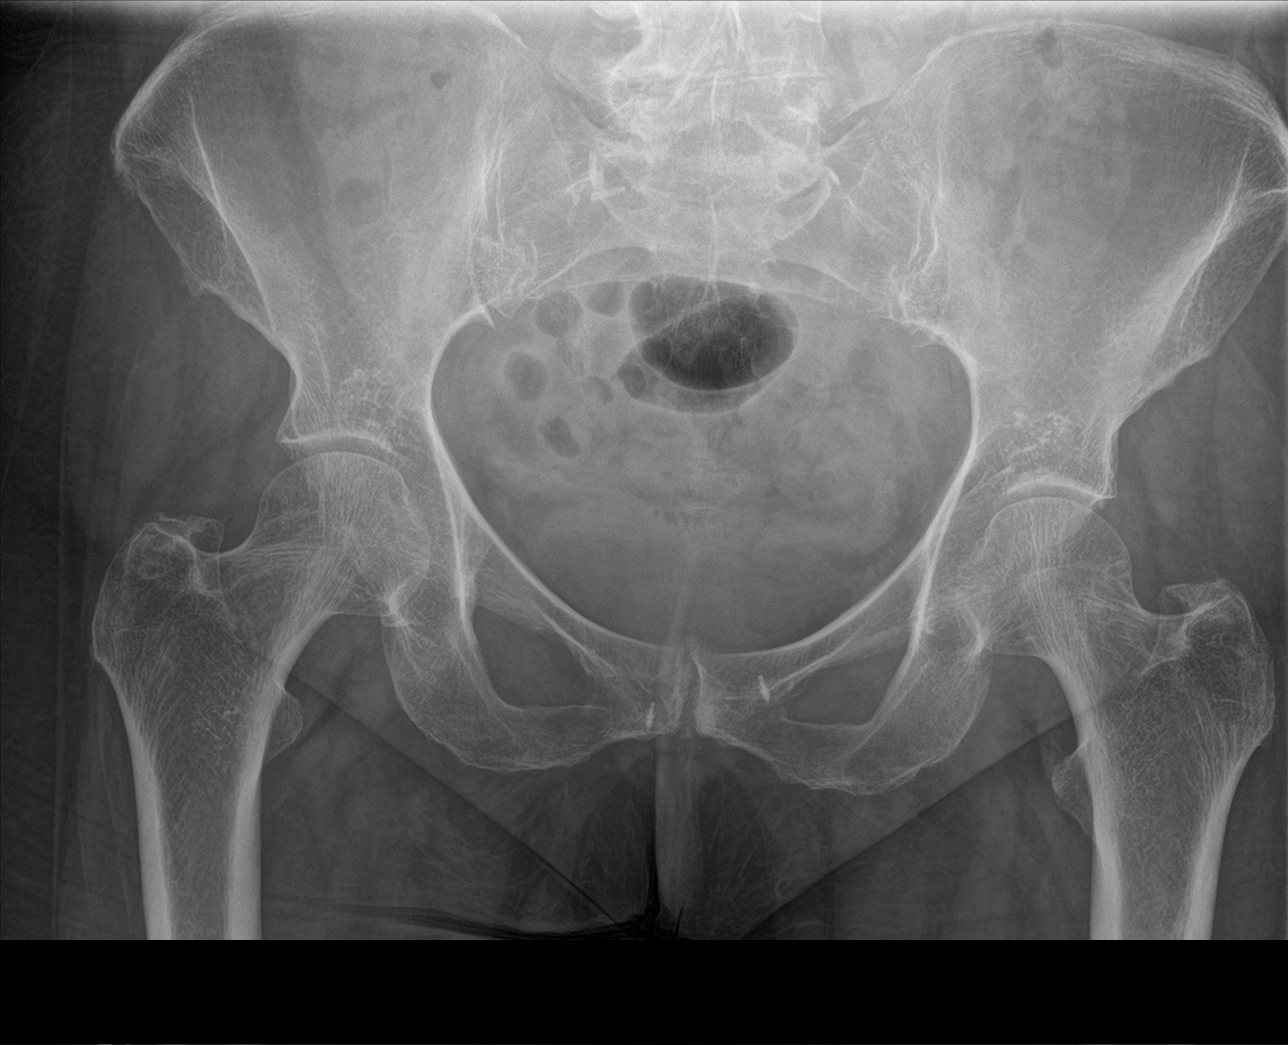
[im 3/4]
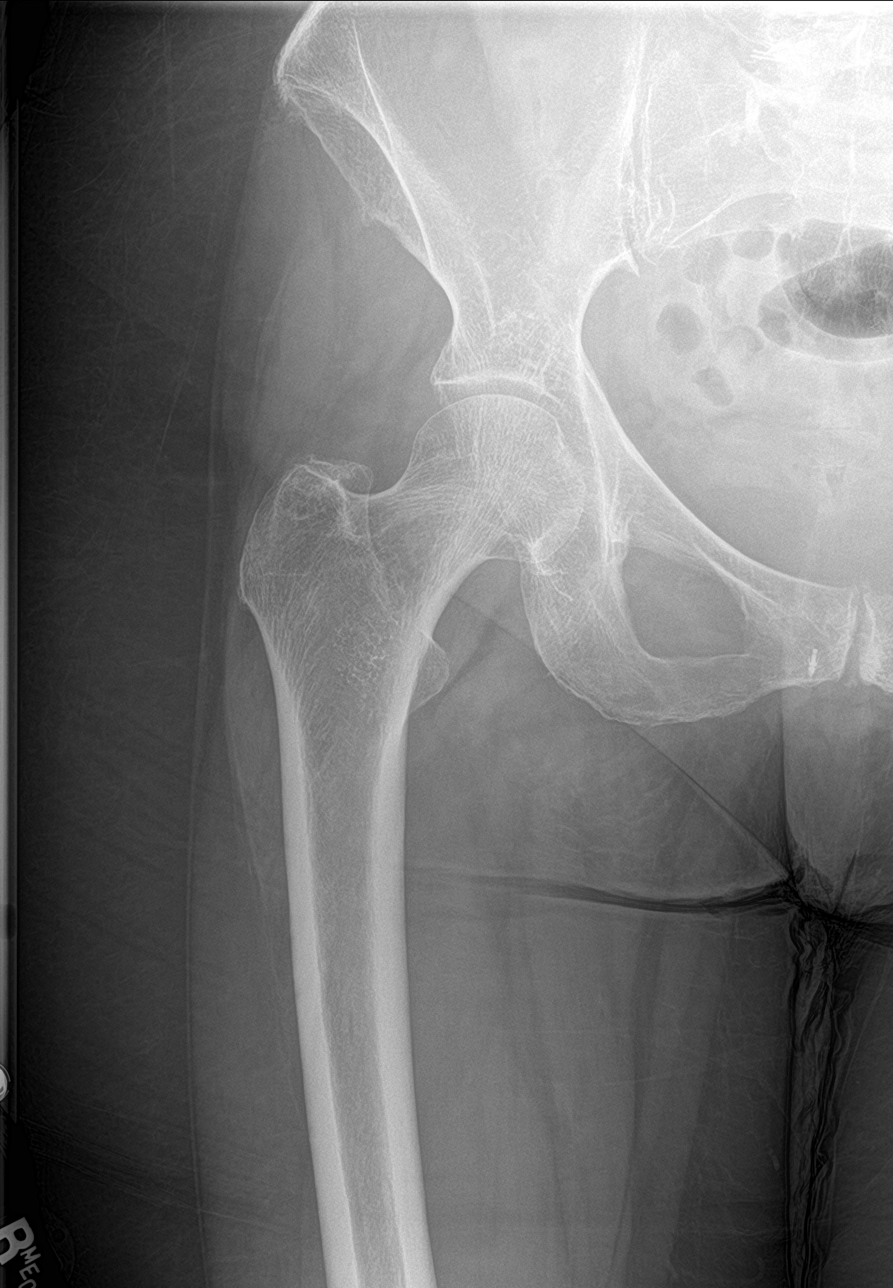
[im 4/4]
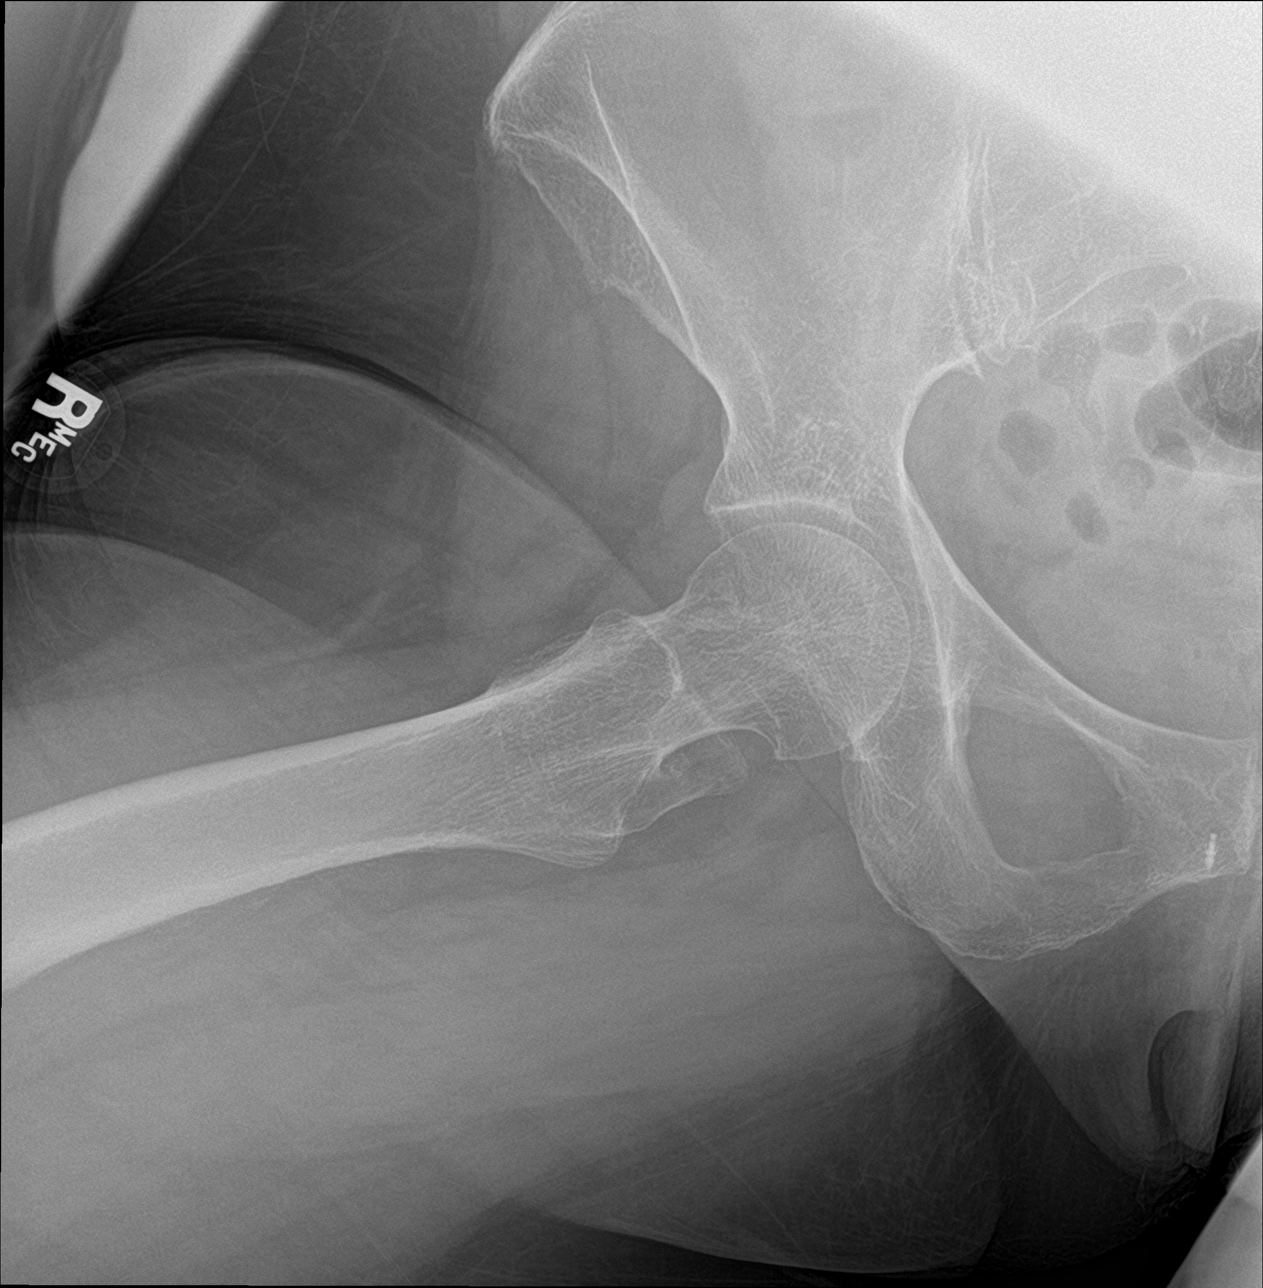

[4 of 4 positions shown; findings below may reference images not displayed]

FINDINGS: No recent fracture or dislocation is seen. There are no focal lytic
lesions. No significant degenerative changes are noted in the right
hip. There are metallic densities in the bodies of pubic bones on
both sides suggesting previous intervention. Degenerative changes
are noted in the visualized lower lumbar spine.
IMPRESSION: No radiographic abnormality is seen in the right hip.

Lumbar spondylosis.

## 2022-12-31 ENCOUNTER — Ambulatory Visit
Admission: RE | Admit: 2022-12-31 | Discharge: 2022-12-31 | Disposition: A | Discharge status: Home / Self Care | Payer: Medicare HMO | Source: Ambulatory Visit | Attending: Otolaryngology | Admitting: Otolaryngology

## 2022-12-31 ENCOUNTER — Other Ambulatory Visit: Payer: Self-pay | Admitting: Otolaryngology

## 2022-12-31 DIAGNOSIS — R053 Chronic cough: Secondary | ICD-10-CM | POA: Diagnosis not present

## 2022-12-31 DIAGNOSIS — R682 Dry mouth, unspecified: Secondary | ICD-10-CM | POA: Diagnosis not present

## 2022-12-31 DIAGNOSIS — R059 Cough, unspecified: Secondary | ICD-10-CM

## 2022-12-31 DIAGNOSIS — R490 Dysphonia: Secondary | ICD-10-CM | POA: Diagnosis not present

## 2022-12-31 DIAGNOSIS — J329 Chronic sinusitis, unspecified: Secondary | ICD-10-CM | POA: Diagnosis not present

## 2023-01-07 DIAGNOSIS — R8290 Unspecified abnormal findings in urine: Secondary | ICD-10-CM | POA: Diagnosis not present

## 2023-01-07 DIAGNOSIS — Z79899 Other long term (current) drug therapy: Secondary | ICD-10-CM | POA: Diagnosis not present

## 2023-01-07 DIAGNOSIS — R300 Dysuria: Secondary | ICD-10-CM | POA: Diagnosis not present

## 2023-01-14 DIAGNOSIS — F329 Major depressive disorder, single episode, unspecified: Secondary | ICD-10-CM | POA: Diagnosis not present

## 2023-01-14 DIAGNOSIS — Z79899 Other long term (current) drug therapy: Secondary | ICD-10-CM | POA: Diagnosis not present

## 2023-01-14 DIAGNOSIS — Z1331 Encounter for screening for depression: Secondary | ICD-10-CM | POA: Diagnosis not present

## 2023-01-14 DIAGNOSIS — Z0000 Encounter for general adult medical examination without abnormal findings: Secondary | ICD-10-CM | POA: Diagnosis not present

## 2023-02-18 DIAGNOSIS — L298 Other pruritus: Secondary | ICD-10-CM | POA: Diagnosis not present

## 2023-02-18 DIAGNOSIS — L853 Xerosis cutis: Secondary | ICD-10-CM | POA: Diagnosis not present

## 2023-02-18 DIAGNOSIS — L538 Other specified erythematous conditions: Secondary | ICD-10-CM | POA: Diagnosis not present

## 2023-02-18 DIAGNOSIS — L578 Other skin changes due to chronic exposure to nonionizing radiation: Secondary | ICD-10-CM | POA: Diagnosis not present

## 2023-02-18 DIAGNOSIS — L821 Other seborrheic keratosis: Secondary | ICD-10-CM | POA: Diagnosis not present

## 2023-02-18 DIAGNOSIS — L309 Dermatitis, unspecified: Secondary | ICD-10-CM | POA: Diagnosis not present

## 2023-02-18 DIAGNOSIS — R208 Other disturbances of skin sensation: Secondary | ICD-10-CM | POA: Diagnosis not present

## 2023-02-18 DIAGNOSIS — D2372 Other benign neoplasm of skin of left lower limb, including hip: Secondary | ICD-10-CM | POA: Diagnosis not present

## 2023-02-18 DIAGNOSIS — L820 Inflamed seborrheic keratosis: Secondary | ICD-10-CM | POA: Diagnosis not present

## 2023-03-11 ENCOUNTER — Encounter: Payer: Medicare HMO | Admitting: Student in an Organized Health Care Education/Training Program

## 2023-03-16 ENCOUNTER — Ambulatory Visit
Payer: Medicare HMO | Attending: Student in an Organized Health Care Education/Training Program | Admitting: Student in an Organized Health Care Education/Training Program

## 2023-03-16 ENCOUNTER — Encounter: Payer: Self-pay | Admitting: Student in an Organized Health Care Education/Training Program

## 2023-03-16 DIAGNOSIS — G8929 Other chronic pain: Secondary | ICD-10-CM | POA: Diagnosis not present

## 2023-03-16 DIAGNOSIS — M5416 Radiculopathy, lumbar region: Secondary | ICD-10-CM | POA: Insufficient documentation

## 2023-03-16 DIAGNOSIS — M961 Postlaminectomy syndrome, not elsewhere classified: Secondary | ICD-10-CM | POA: Diagnosis not present

## 2023-03-16 DIAGNOSIS — M533 Sacrococcygeal disorders, not elsewhere classified: Secondary | ICD-10-CM | POA: Diagnosis not present

## 2023-03-16 DIAGNOSIS — M1712 Unilateral primary osteoarthritis, left knee: Secondary | ICD-10-CM | POA: Diagnosis not present

## 2023-03-16 DIAGNOSIS — G894 Chronic pain syndrome: Secondary | ICD-10-CM | POA: Diagnosis not present

## 2023-03-16 MED ORDER — OXYCODONE-ACETAMINOPHEN 10-325 MG PO TABS
1.0000 | ORAL_TABLET | Freq: Three times a day (TID) | ORAL | 0 refills | Status: AC | PRN
Start: 2023-05-29 — End: 2023-06-28

## 2023-03-16 MED ORDER — OXYCODONE-ACETAMINOPHEN 10-325 MG PO TABS
1.0000 | ORAL_TABLET | Freq: Three times a day (TID) | ORAL | 0 refills | Status: AC | PRN
Start: 2023-03-30 — End: 2023-04-29

## 2023-03-16 MED ORDER — DULOXETINE HCL 60 MG PO CPEP
60.0000 mg | ORAL_CAPSULE | Freq: Every day | ORAL | 1 refills | Status: DC
Start: 2023-03-16 — End: 2024-06-14

## 2023-03-16 MED ORDER — OXYCODONE-ACETAMINOPHEN 10-325 MG PO TABS
1.0000 | ORAL_TABLET | Freq: Three times a day (TID) | ORAL | 0 refills | Status: AC | PRN
Start: 2023-04-29 — End: 2023-05-29

## 2023-03-16 MED ORDER — GABAPENTIN 400 MG PO CAPS
400.0000 mg | ORAL_CAPSULE | Freq: Two times a day (BID) | ORAL | 1 refills | Status: DC
Start: 2023-03-16 — End: 2023-09-03

## 2023-03-16 NOTE — Progress Notes (Signed)
Nursing Pain Medication Assessment:  Safety precautions to be maintained throughout the outpatient stay will include: orient to surroundings, keep bed in low position, maintain call bell within reach at all times, provide assistance with transfer out of bed and ambulation.  Medication Inspection Compliance: Pill count conducted under aseptic conditions, in front of the patient. Neither the pills nor the bottle was removed from the patient's sight at any time. Once count was completed pills were immediately returned to the patient in their original bottle.  Medication: Oxycodone/APAP Pill/Patch Count:  34 of 90 pills remain Pill/Patch Appearance: Markings consistent with prescribed medication Bottle Appearance: Standard pharmacy container. Clearly labeled. Filled Date: 03 / 17 / 2024 Last Medication intake:  TodaySafety precautions to be maintained throughout the outpatient stay will include: orient to surroundings, keep bed in low position, maintain call bell within reach at all times, provide assistance with transfer out of bed and ambulation.

## 2023-03-16 NOTE — Progress Notes (Signed)
PROVIDER NOTE: Information contained herein reflects review and annotations entered in association with encounter. Interpretation of such information and data should be left to medically-trained personnel. Information provided to patient can be located elsewhere in the medical record under "Patient Instructions". Document created using STT-dictation technology, any transcriptional errors that may result from process are unintentional.    Patient: Emily Landry  Service Category: E/M  Provider: Gillis Santa, MD  DOB: 05-05-1951  DOS: 03/16/2023  Specialty: Interventional Pain Management  MRN: [id number]  Setting: Ambulatory outpatient  PCP: Derinda Late, MD  Type: Established Patient    Referring Provider: Derinda Late, MD  Location: Office  Delivery: Face-to-face     HPI  Ms. Karlina Banken, a 72 y.o. year old female, is here today because of her No primary diagnosis found.. Ms. Fixico primary complain today is Back Pain (lower) Last encounter: My last encounter with her was on 12/24/22 Pertinent problems: Ms. Flammer has Chronic pain syndrome; Lumbar post-laminectomy syndrome; Lumbar radiculopathy; Chronic pain of both shoulders; PTSD (post-traumatic stress disorder); Plantar fasciitis of right foot; and Intercostal neuralgia on their pertinent problem list. Pain Assessment: Severity of Chronic pain is reported as a 4 /10. Location: Back Lower/denies. Onset: More than a month ago. Quality: Dull, Constant. Timing: Constant. Modifying factor(s): lyind down. Vitals:  height is 5\' 8"  (1.727 m) and weight is 210 lb (95.3 kg). Her temporal temperature is 97.8 F (36.6 C). Her blood pressure is 187/89 (abnormal) and her pulse is 66. Her respiration is 16 and oxygen saturation is 99%.   Reason for encounter: medication management  Had right molar extracted and has experienced a dry socket after the tooth extraction. She is endorsing increased pain in her right jaw. She has Celebrex at home which I  told her to take.  Refill of Percocet at 10 mg every 8 hours as needed.  Otherwise, patient continues gabapentin and Cymbalta as prescribed as a part of her multimodal pain regimen.  Discussed increasing Gabapentin to 400 mg BID  Renew UDS today  Pharmacotherapy Assessment  Analgesic:  Percocet  10 mg every 8 hours as needed.  MME 45     Monitoring: [place] PMP: PDMP reviewed during this encounter.       Pharmacotherapy: No side-effects or adverse reactions reported. Compliance: No problems identified. Effectiveness: Clinically acceptable.  Landis Martins, RN  03/16/2023  8:16 AM  Sign when Signing Visit Nursing Pain Medication Assessment:  Safety precautions to be maintained throughout the outpatient stay will include: orient to surroundings, keep bed in low position, maintain call bell within reach at all times, provide assistance with transfer out of bed and ambulation.  Medication Inspection Compliance: Pill count conducted under aseptic conditions, in front of the patient. Neither the pills nor the bottle was removed from the patient's sight at any time. Once count was completed pills were immediately returned to the patient in their original bottle.  Medication: Oxycodone/APAP Pill/Patch Count:  34 of 90 pills remain Pill/Patch Appearance: Markings consistent with prescribed medication Bottle Appearance: Standard pharmacy container. Clearly labeled. Filled Date: 03 / 17 / 2024 Last Medication intake:  TodaySafety precautions to be maintained throughout the outpatient stay will include: orient to surroundings, keep bed in low position, maintain call bell within reach at all times, provide assistance with transfer out of bed and ambulation.     UDS:  Summary  Date Value Ref Range Status  03/26/2022 Note  Final    Comment:    ==================================================================== [employer] Select 13  (  MW) ==================================================================== Test                             Result       Flag       Units  Drug Present and Declared for Prescription Verification   Morphine                       >10204       EXPECTED   ng/mg creat   Normorphine                    409          EXPECTED   ng/mg creat    Potential sources of large amounts of morphine in the absence of    codeine include administration of morphine or use of heroin.     Normorphine is an expected metabolite of morphine.    Hydromorphone                  72           EXPECTED   ng/mg creat    Hydromorphone may be present as a metabolite of morphine;    concentrations of hydromorphone rarely exceed 5% of the morphine    concentration when this is the source of hydromorphone.    Oxycodone                      1493         EXPECTED   ng/mg creat   Oxymorphone                    155          EXPECTED   ng/mg creat   Noroxycodone                   4240         EXPECTED   ng/mg creat    Sources of oxycodone include scheduled prescription medications.    Oxymorphone and noroxycodone are expected metabolites of oxycodone.    Oxymorphone is also available as a scheduled prescription medication.  Drug Present not Declared for Prescription Verification   Carboxy-THC                    15           UNEXPECTED ng/mg creat    Carboxy-THC is a metabolite of tetrahydrocannabinol (THC). Source of    THC is most commonly herbal marijuana or marijuana-based products,    but THC is also present in a scheduled prescription medication.    Trace amounts of THC can be present in hemp and cannabidiol (CBD)    products. This test is not intended to distinguish between delta-9-    tetrahydrocannabinol, the predominant form of THC in most herbal or    marijuana-based products, and delta-8-tetrahydrocannabinol.  ==================================================================== Test                      Result     Flag   Units      Ref Range   Creatinine              98               mg/dL      >=20 ==================================================================== Declared Medications:  The flagging and interpretation on this report are  based on the  following declared medications.  Unexpected results may arise from  inaccuracies in the declared medications.   **Note: The testing scope of this panel includes these medications:   Morphine (MS Contin)  Oxycodone (Percocet)   **Note: The testing scope of this panel does not include the  following reported medications:   Acetaminophen (Percocet)  Bupropion (Wellbutrin)  Cholecalciferol  Duloxetine (Cymbalta)  Folic Acid  Gabapentin (Neurontin)  Methotrexate  Omeprazole (Prilosec)  Triamcinolone (Kenalog) ==================================================================== For clinical consultation, please call [phone]. ====================================================================      ROS  Constitutional: Denies any fever or chills Gastrointestinal: No reported hemesis, hematochezia, vomiting, or acute GI distress Musculoskeletal:  right jaw and right tooth pain Neurological: No reported episodes of acute onset apraxia, aphasia, dysarthria, agnosia, amnesia, paralysis, loss of coordination, or loss of consciousness  Medication Review  DULoxetine, benzonatate, buPROPion, cevimeline, folic acid, gabapentin, ibandronate, methotrexate, oxyCODONE-acetaminophen, and pantoprazole  History Review  Allergy: Ms. Rourke has No Known Allergies. Drug: Ms. Gurman  reports no history of drug use. Alcohol:  reports current alcohol use. Tobacco:  reports that she has quit smoking. Her smoking use included cigarettes. She has never used smokeless tobacco. Social: Ms. Weedman  reports that she has quit smoking. Her smoking use included cigarettes. She has never used smokeless tobacco. She reports current alcohol use. She reports that  she does not use drugs. Medical:  has a past medical history of Anxiety, Arthritis, Chronic back pain, Depression, Elevated blood pressure reading, GERD (gastroesophageal reflux disease), History of hiatal hernia, PONV (postoperative nausea and vomiting), PTSD (post-traumatic stress disorder), and Refusal of blood transfusions as patient is Jehovah's Witness. Surgical: Ms. Diers  has a past surgical history that includes Laminectomy (1971); Lumbar disc surgery (1991); Tonsillectomy; Cholecystectomy; Abdominal hysterectomy; Colonoscopy; and Lumbar laminectomy/decompression microdiscectomy (N/A, 05/20/2022). Family: family history includes Alcohol abuse in her father; Arthritis in her father; Breast cancer (age of onset: 71) in her mother; Diabetes in her mother; Gout in her father; Rheum arthritis in her mother.  Laboratory Chemistry Profile   Renal Lab Results  Component Value Date   BUN 13 05/14/2022   CREATININE 0.44 05/20/2022   GFRNONAA >60 05/20/2022    Hepatic Lab Results  Component Value Date   AST 31 05/21/2020   ALT 22 05/21/2020   ALBUMIN 4.2 05/21/2020   ALKPHOS 71 05/21/2020    Electrolytes Lab Results  Component Value Date   NA 140 05/14/2022   K 3.6 05/14/2022   CL 107 05/14/2022   CALCIUM 9.0 05/14/2022    Bone No results found for: "VD25OH", "VD125OH2TOT", "[id number]", "[id number]", "25OHVITD1", "25OHVITD2", "25OHVITD3", "TESTOFREE", "TESTOSTERONE"  Inflammation (CRP: Acute Phase) (ESR: Chronic Phase) Lab Results  Component Value Date   ESRSEDRATE 6 12/06/2019         Note: Above Lab results reviewed.  Physical Exam  General appearance: Well nourished, well developed, and well hydrated. In no apparent acute distress Mental status: Alert, oriented x 3 (person, place, & time)       Respiratory: No evidence of acute respiratory distress Eyes: PERLA Vitals: BP (!) 187/89   Pulse 66   Temp 97.8 F (36.6 C) (Temporal)   Resp 16   Ht 5\' 8"  (1.727 m)   Wt 210  lb (95.3 kg)   SpO2 99%   BMI 31.93 kg/m  BMI: Estimated body mass index is 31.93 kg/m as calculated from the following:   Height as of this encounter: 5\' 8"  (1.727 m).   Weight as  of this encounter: 210 lb (95.3 kg). Ideal: Ideal body weight: 63.9 kg (140 lb 14 oz) Adjusted ideal body weight: 76.4 kg (168 lb 8.4 oz)  Right jaw and tooth pain Cervicalgia  Assessment   Diagnosis Status  1. Lumbar radiculopathy   2. Coccydynia   3. Chronic pain syndrome   4. Lumbar post-laminectomy syndrome   5. Primary osteoarthritis of left knee   6. Chronic radicular lumbar pain      Controlled Controlled Controlled    Plan of Care    Ms. Anshika Raffensperger has a current medication list which includes the following long-term medication(s): bupropion, bupropion, duloxetine, and gabapentin.  Pharmacotherapy (Medications Ordered): Meds ordered this encounter  Medications   oxyCODONE-acetaminophen (PERCOCET) 10-325 MG tablet    Sig: Take 1 tablet by mouth every 8 (eight) hours as needed for pain. Must last 30 days.    Dispense:  90 tablet    Refill:  0    Chronic Pain: STOP Act (Not applicable) Fill 1 day early if closed on refill date. Avoid benzodiazepines within 8 hours of opioids   oxyCODONE-acetaminophen (PERCOCET) 10-325 MG tablet    Sig: Take 1 tablet by mouth every 8 (eight) hours as needed for pain. Must last 30 days.    Dispense:  90 tablet    Refill:  0    Chronic Pain: STOP Act (Not applicable) Fill 1 day early if closed on refill date. Avoid benzodiazepines within 8 hours of opioids   oxyCODONE-acetaminophen (PERCOCET) 10-325 MG tablet    Sig: Take 1 tablet by mouth every 8 (eight) hours as needed for pain. Must last 30 days.    Dispense:  90 tablet    Refill:  0    Chronic Pain: STOP Act (Not applicable) Fill 1 day early if closed on refill date. Avoid benzodiazepines within 8 hours of opioids   DULoxetine (CYMBALTA) 60 MG capsule    Sig: Take 1 capsule (60 mg total) by  mouth daily.    Dispense:  90 capsule    Refill:  1   gabapentin (NEURONTIN) 400 MG capsule    Sig: Take 1 capsule (400 mg total) by mouth 2 (two) times daily.    Dispense:  90 capsule    Refill:  1   Orders Placed This Encounter  Procedures   ToxASSURE Select 13 (MW), Urine    Volume: 30 ml(s). Minimum 3 ml of urine is needed. Document temperature of fresh sample. Indications: Long term (current) use of opiate analgesic [phone])    Order Specific Question:   Release to patient    Answer:   Immediate  Patient was honest that she has been taking CBD for insomnia   Repeat caudal, left knee steroid injection as needed.  Follow-up plan:   Return in about 3 months (around 06/15/2023) for Medication Management, in person.    Recent Visits Date Type Provider Dept  12/24/22 Office Visit Gillis Santa, MD [facility]  Showing recent visits within past 90 days and meeting all other requirements Today's Visits Date Type Provider Dept  03/16/23 Office Visit Gillis Santa, MD [facility]  Showing today's visits and meeting all other requirements Future Appointments No visits were found meeting these conditions. Showing future appointments within next 90 days and meeting all other requirements  I discussed the assessment and treatment plan with the patient. The patient was provided an opportunity to ask questions and all were answered. The patient agreed with the plan and demonstrated an understanding  of the instructions.  Patient advised to call back or seek an in-person evaluation if the symptoms or condition worsens.  Duration of encounter: 28minutes.  Total time on encounter, as per AMA guidelines included both the face-to-face and non-face-to-face time personally spent by the physician and/or other qualified health care professional(s) on the day of the encounter (includes time in activities that require the physician or other qualified health care professional and  does not include time in activities normally performed by clinical staff). Physician's time may include the following activities when performed: preparing to see the patient (eg, review of tests, pre-charting review of records) obtaining and/or reviewing separately obtained history performing a medically appropriate examination and/or evaluation counseling and educating the patient/family/caregiver ordering medications, tests, or procedures referring and communicating with other health care professionals (when not separately reported) documenting clinical information in the electronic or other health record independently interpreting results (not separately reported) and communicating results to the patient/ family/caregiver care coordination (not separately reported)  Note by: Gillis Santa, MD Date: 03/16/2023; Time: 8:33 AM

## 2023-03-19 LAB — TOXASSURE SELECT 13 (MW), URINE

## 2023-03-23 ENCOUNTER — Telehealth: Payer: Self-pay | Admitting: *Deleted

## 2023-03-23 NOTE — Telephone Encounter (Signed)
Notified patient of UDS results.Informed her that Dr. Cherylann Ratel will not longer prescribe Oxycodone for her. Also called [facility], cancelled three remaining prescriptions for Oxycodone/Acet.

## 2023-03-23 NOTE — Telephone Encounter (Signed)
-----   Message from Edward Jolly, MD sent at 03/23/2023  8:13 AM EDT ----- Regarding: please call pt with UDS results Hi Emily Landry, Please call patient and inform her of her UDS results.  There was morphine in her urine toxicology screen which is a clear violation of her pain contract.  By her taking oxycodone, there is no way that she should have metabolites or morphine present in her urine.  We had already discussed her CBD intake but her THC levels were also very high.  On the basis of her urine toxicology screen being positive for morphine, we will have to discontinue her oxycodone that I am prescribing given noncompliance with medication regimen.  Please inform her that this will be the last month that she will be on oxycodone prescribed by me.  Please call pharmacy and cancel prescriptions for May and June.  Please make sure this is documented.   ----- Message ----- From: Interface, [facility] Lab Results In Sent: 03/19/2023   5:37 AM EDT To: Edward Jolly, MD

## 2023-03-25 ENCOUNTER — Encounter: Payer: Self-pay | Admitting: Neurosurgery

## 2023-04-15 IMAGING — MR MR LUMBAR SPINE W/O CM
4 of 5 series · 32 of 48 positions shown · non-contrast
Comparison: Lumbar spine MRI 11/02/2020

CLINICAL DATA: Chronic right-sided low back pain with right-sided
sciatica. Felt a pop several weeks ago. History of lumbar surgery.

EXAM:
MRI LUMBAR SPINE WITHOUT CONTRAST
TECHNIQUE: Multiplanar, multisequence MR imaging of the lumbar spine was
performed. No intravenous contrast was administered.

[Series 5: T2 · sagittal · 4.0mm · 0.81mm/px · 8 of 19 slices shown (1 of 2)]
[im 1/19]
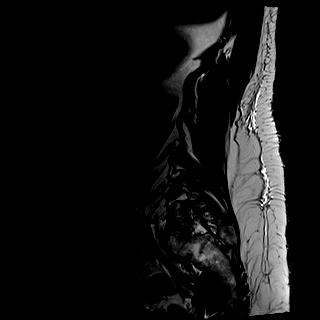
[im 3/19]
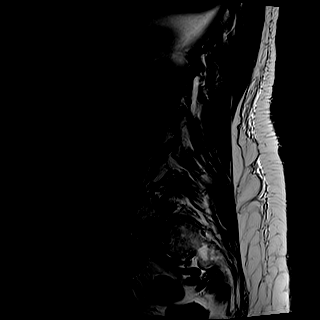
[im 6/19]
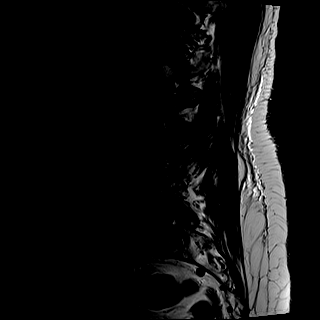
[im 8/19]
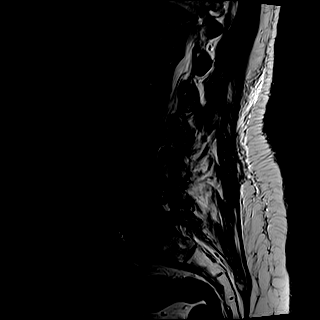
[im 11/19]
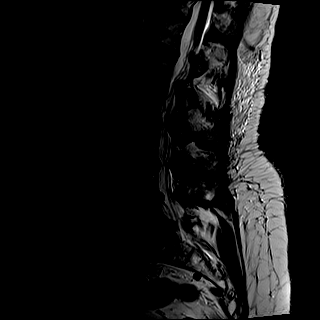
[im 13/19]
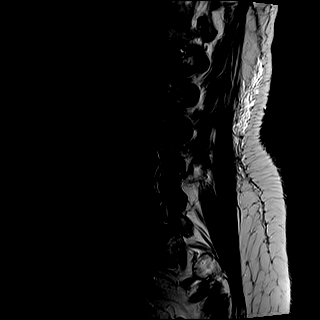
[im 16/19]
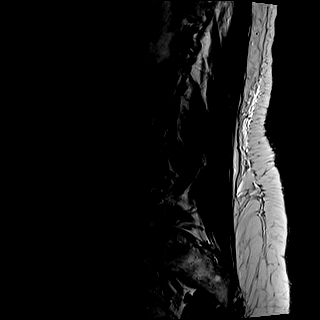
[im 19/19]
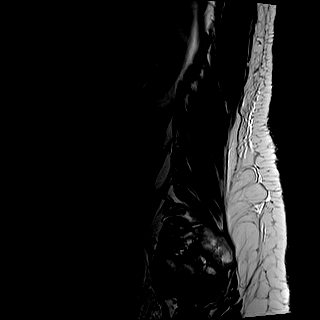

[Series 6: T1 · sagittal · 4.0mm · 0.81mm/px · 7 of 19 slices shown (1 of 2)]
[im 1/19]
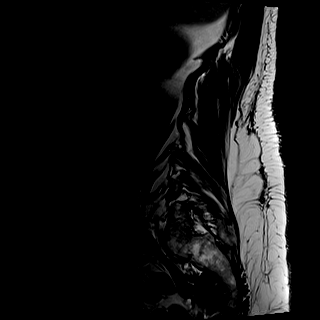
[im 4/19]
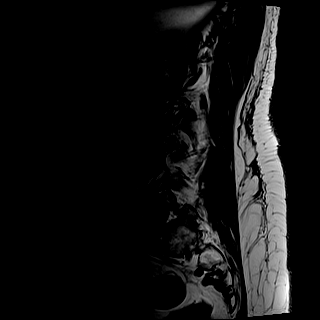
[im 7/19]
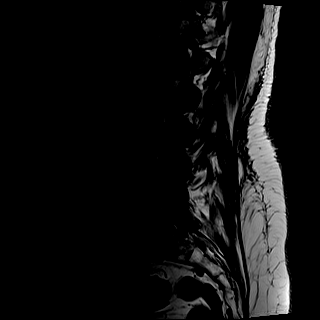
[im 10/19]
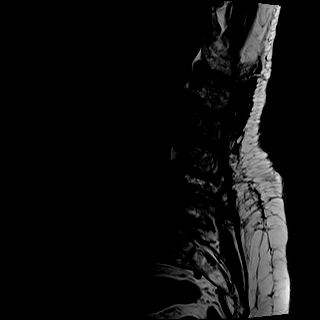
[im 13/19]
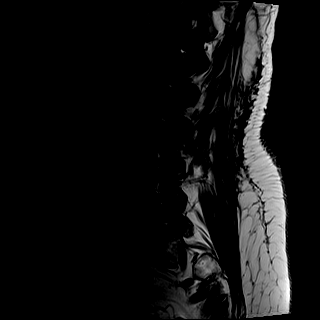
[im 16/19]
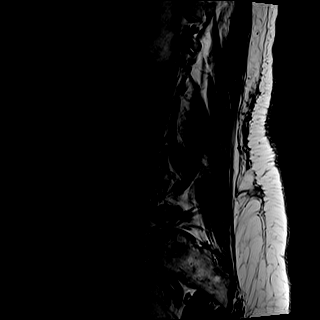
[im 19/19]
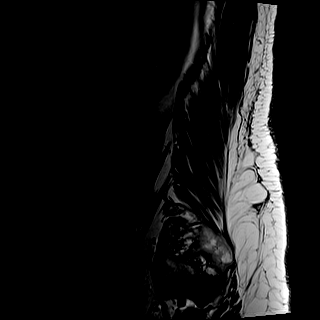

[Series 10: T2 · axial · 4.0mm · 0.78mm/px · z∈[-137,+73]mm · 9 of 36 slices shown (2 of 2)]
[im 1/36]
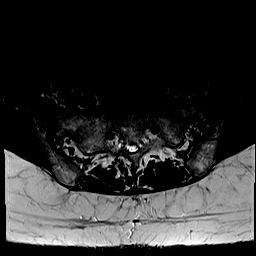
[im 6/36]
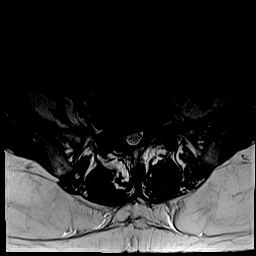
[im 12/36]
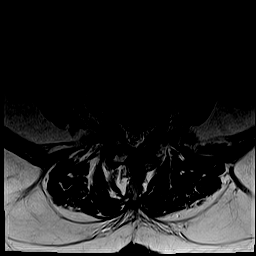
[im 15/36]
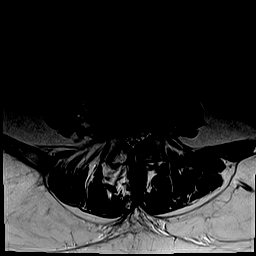
[im 18/36]
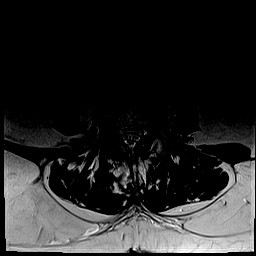
[im 21/36]
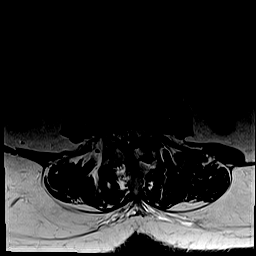
[im 24/36]
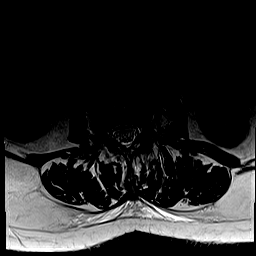
[im 30/36]
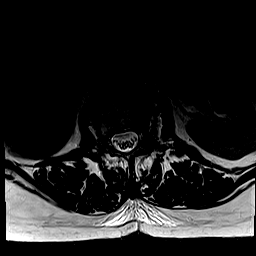
[im 36/36]
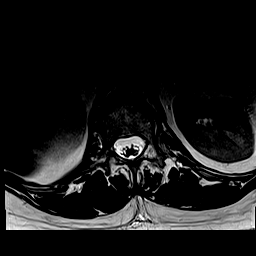

[Series 13: T1 · axial · 4.0mm · 0.39mm/px · z∈[-137,+44]mm · 8 of 36 slices shown (2 of 2)]
[im 1/36]
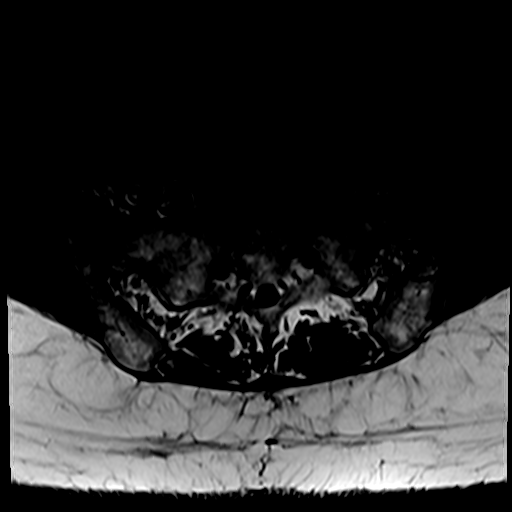
[im 6/36]
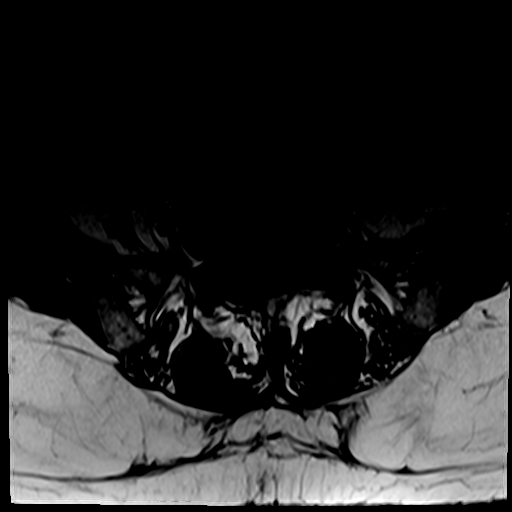
[im 12/36]
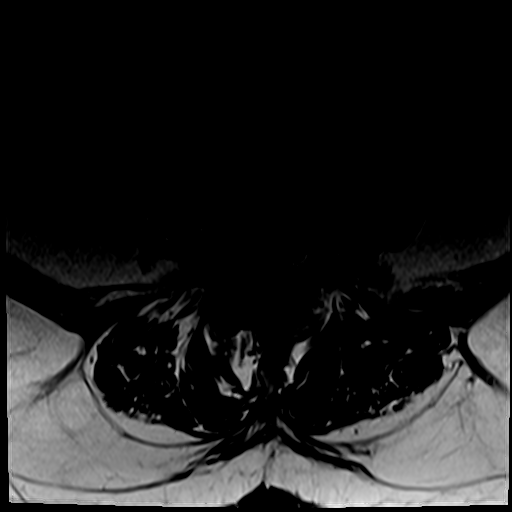
[im 15/36]
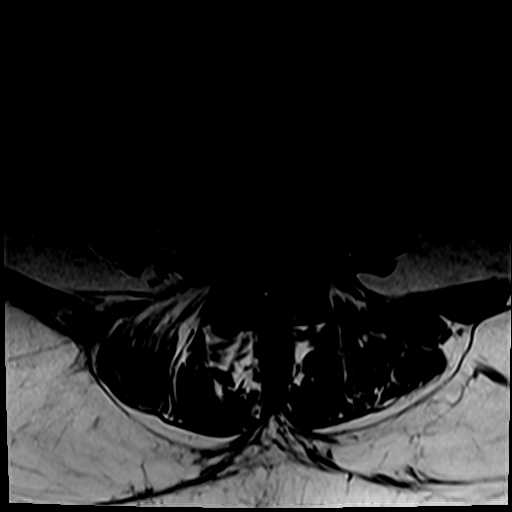
[im 18/36]
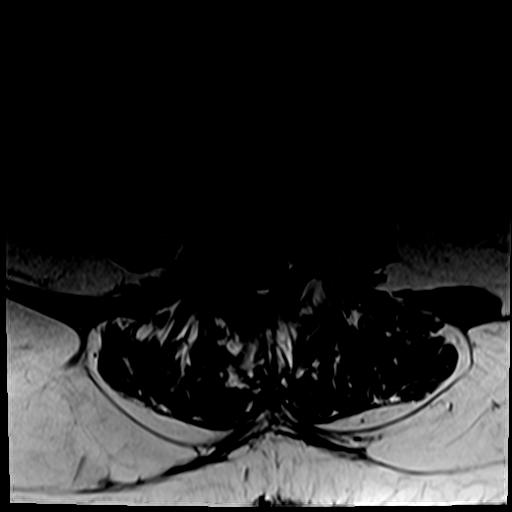
[im 21/36]
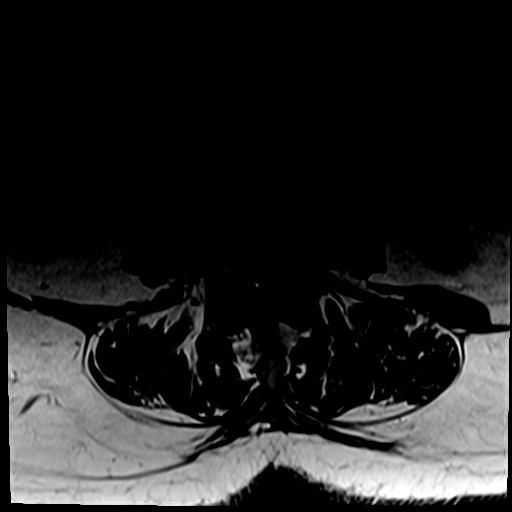
[im 24/36]
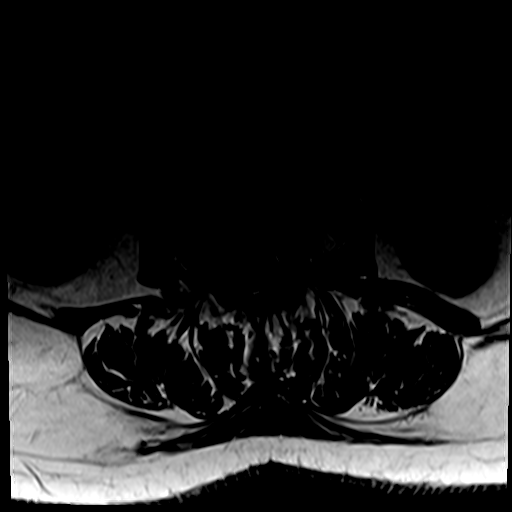
[im 30/36]
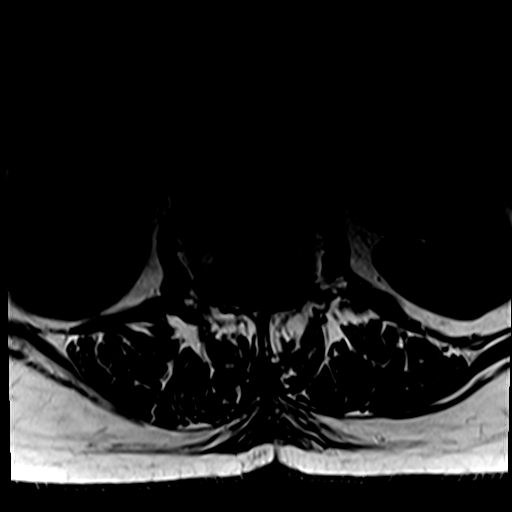

[32 of 48 positions shown; findings below may reference images not displayed]

FINDINGS: Segmentation:  Standard.

Alignment:  Mild upper lumbar dextroscoliosis.

Vertebrae: Incompletely visualized new T11 inferior endplate
compression fracture with marrow edema. New T12 inferior endplate
compression fracture with 10% vertebral body height loss and mild
marrow edema. New L1 superior endplate compression fracture with 30%
height loss and mild marrow edema. New L2 superior endplate
compression fracture with 35% height loss and moderate marrow edema.
New L3 superior endplate compression fracture with 25% height loss
and extensive marrow edema. No significant retropulsion associated
with any of these fractures. Mild endplate edema at L4-5 and L5-S1,
less than on the prior MRI and degenerative in appearance. Increased
multilevel facet edema compared to the prior MRI, most notable
bilaterally at L2-3 and on the right at L3-4. No suspicious marrow
lesion.

Conus medullaris and cauda equina: Conus extends to the T12-L1
level. Conus and cauda equina appear normal.

Paraspinal and other soft tissues: Unremarkable.

Disc levels:

Disc desiccation throughout the lumbar spine. Chronic, moderate disc
space narrowing at L3-4 and severe narrowing at L4-5 and L5-S1.
Diffuse congenital narrowing of the lumbar spinal canal due to short
pedicles.

T12-L1: Minimal disc bulging and mild facet and ligamentum flavum
hypertrophy without stenosis.

L1-2: Minimal disc bulging and moderate facet and ligamentum flavum
hypertrophy without significant stenosis.

L2-3: Disc bulging eccentric to the left and moderate to severe
facet and ligamentum flavum hypertrophy result in moderate spinal
stenosis, mild right and moderate left lateral recess stenosis, and
mild right and moderate left neural foraminal stenosis, overall
slightly progressed from prior.

L3-4: Disc bulging eccentric to the right, a large right paracentral
to subarticular disc extrusion with slight caudal migration, and
moderate to severe facet and ligamentum flavum hypertrophy result in
moderate spinal stenosis, severe right and moderate left lateral
recess stenosis, and mild-to-moderate bilateral neural foraminal
stenosis, overall mildly progressed from prior.

L4-5: Disc bulging and moderate facet and ligamentum flavum
hypertrophy result in severe spinal stenosis and severe right and
moderate left neural foraminal stenosis, overall mildly progressed
from prior.

L5-S1: Prior right laminectomy. Disc bulging, a right paracentral to
right foraminal disc protrusion, and moderate facet hypertrophy
result in mild spinal stenosis, moderate bilateral lateral recess
stenosis, and severe right and moderate left neural foraminal
stenosis, unchanged.
IMPRESSION: 1. New T11-L3 compression fractures with mild-to-moderate height
loss and varying degrees of marrow edema as detailed above, greatest
at L3.
2. Mildly progressive lumbar disc and facet degeneration with severe
spinal stenosis at L4-5 and moderate spinal stenosis at L2-3 and
L3-4.
3. Severe right and moderate left neural foraminal stenosis at L4-5
and L5-S1.

## 2023-05-26 DIAGNOSIS — L578 Other skin changes due to chronic exposure to nonionizing radiation: Secondary | ICD-10-CM | POA: Diagnosis not present

## 2023-05-26 DIAGNOSIS — L810 Postinflammatory hyperpigmentation: Secondary | ICD-10-CM | POA: Diagnosis not present

## 2023-06-02 ENCOUNTER — Encounter: Payer: Self-pay | Admitting: Student in an Organized Health Care Education/Training Program

## 2023-06-10 ENCOUNTER — Encounter: Payer: Medicare HMO | Admitting: Student in an Organized Health Care Education/Training Program

## 2023-06-11 DIAGNOSIS — H903 Sensorineural hearing loss, bilateral: Secondary | ICD-10-CM | POA: Diagnosis not present

## 2023-06-14 DIAGNOSIS — H2513 Age-related nuclear cataract, bilateral: Secondary | ICD-10-CM | POA: Diagnosis not present

## 2023-06-14 DIAGNOSIS — H43813 Vitreous degeneration, bilateral: Secondary | ICD-10-CM | POA: Diagnosis not present

## 2023-06-21 DIAGNOSIS — Z0100 Encounter for examination of eyes and vision without abnormal findings: Secondary | ICD-10-CM | POA: Diagnosis not present

## 2023-06-22 ENCOUNTER — Other Ambulatory Visit: Payer: Self-pay | Admitting: Student in an Organized Health Care Education/Training Program

## 2023-06-22 DIAGNOSIS — M533 Sacrococcygeal disorders, not elsewhere classified: Secondary | ICD-10-CM

## 2023-06-22 DIAGNOSIS — G8929 Other chronic pain: Secondary | ICD-10-CM

## 2023-06-22 DIAGNOSIS — M961 Postlaminectomy syndrome, not elsewhere classified: Secondary | ICD-10-CM

## 2023-06-22 DIAGNOSIS — M5416 Radiculopathy, lumbar region: Secondary | ICD-10-CM

## 2023-06-22 DIAGNOSIS — G894 Chronic pain syndrome: Secondary | ICD-10-CM

## 2023-06-22 DIAGNOSIS — M1712 Unilateral primary osteoarthritis, left knee: Secondary | ICD-10-CM

## 2023-07-05 DIAGNOSIS — M65331 Trigger finger, right middle finger: Secondary | ICD-10-CM | POA: Diagnosis not present

## 2023-07-05 DIAGNOSIS — M19041 Primary osteoarthritis, right hand: Secondary | ICD-10-CM | POA: Diagnosis not present

## 2023-07-05 DIAGNOSIS — M0609 Rheumatoid arthritis without rheumatoid factor, multiple sites: Secondary | ICD-10-CM | POA: Diagnosis not present

## 2023-07-05 DIAGNOSIS — M19042 Primary osteoarthritis, left hand: Secondary | ICD-10-CM | POA: Diagnosis not present

## 2023-07-05 DIAGNOSIS — Z79899 Other long term (current) drug therapy: Secondary | ICD-10-CM | POA: Diagnosis not present

## 2023-07-22 DIAGNOSIS — G8929 Other chronic pain: Secondary | ICD-10-CM | POA: Diagnosis not present

## 2023-07-22 DIAGNOSIS — R053 Chronic cough: Secondary | ICD-10-CM | POA: Diagnosis not present

## 2023-07-22 DIAGNOSIS — G479 Sleep disorder, unspecified: Secondary | ICD-10-CM | POA: Diagnosis not present

## 2023-08-02 DIAGNOSIS — M9901 Segmental and somatic dysfunction of cervical region: Secondary | ICD-10-CM | POA: Diagnosis not present

## 2023-08-02 DIAGNOSIS — M5412 Radiculopathy, cervical region: Secondary | ICD-10-CM | POA: Diagnosis not present

## 2023-08-02 DIAGNOSIS — M530 Cervicocranial syndrome: Secondary | ICD-10-CM | POA: Diagnosis not present

## 2023-08-02 DIAGNOSIS — M25511 Pain in right shoulder: Secondary | ICD-10-CM | POA: Diagnosis not present

## 2023-08-06 DIAGNOSIS — M5412 Radiculopathy, cervical region: Secondary | ICD-10-CM | POA: Diagnosis not present

## 2023-08-06 DIAGNOSIS — M9901 Segmental and somatic dysfunction of cervical region: Secondary | ICD-10-CM | POA: Diagnosis not present

## 2023-08-06 DIAGNOSIS — M530 Cervicocranial syndrome: Secondary | ICD-10-CM | POA: Diagnosis not present

## 2023-08-06 DIAGNOSIS — M25511 Pain in right shoulder: Secondary | ICD-10-CM | POA: Diagnosis not present

## 2023-08-13 DIAGNOSIS — M25511 Pain in right shoulder: Secondary | ICD-10-CM | POA: Diagnosis not present

## 2023-08-13 DIAGNOSIS — M9901 Segmental and somatic dysfunction of cervical region: Secondary | ICD-10-CM | POA: Diagnosis not present

## 2023-08-13 DIAGNOSIS — M530 Cervicocranial syndrome: Secondary | ICD-10-CM | POA: Diagnosis not present

## 2023-08-13 DIAGNOSIS — M5412 Radiculopathy, cervical region: Secondary | ICD-10-CM | POA: Diagnosis not present

## 2023-08-30 ENCOUNTER — Other Ambulatory Visit: Payer: Self-pay

## 2023-08-30 ENCOUNTER — Emergency Department: Payer: Medicare HMO

## 2023-08-30 ENCOUNTER — Inpatient Hospital Stay
Admission: EM | Admit: 2023-08-30 | Discharge: 2023-09-03 | DRG: 565 | Disposition: A | Discharge status: Home Health | Payer: Medicare HMO | Attending: Internal Medicine | Admitting: Internal Medicine

## 2023-08-30 ENCOUNTER — Encounter: Payer: Self-pay | Admitting: Emergency Medicine

## 2023-08-30 DIAGNOSIS — S2242XA Multiple fractures of ribs, left side, initial encounter for closed fracture: Secondary | ICD-10-CM | POA: Diagnosis not present

## 2023-08-30 DIAGNOSIS — Z833 Family history of diabetes mellitus: Secondary | ICD-10-CM | POA: Diagnosis not present

## 2023-08-30 DIAGNOSIS — Z6831 Body mass index (BMI) 31.0-31.9, adult: Secondary | ICD-10-CM | POA: Diagnosis not present

## 2023-08-30 DIAGNOSIS — Y9301 Activity, walking, marching and hiking: Secondary | ICD-10-CM | POA: Diagnosis present

## 2023-08-30 DIAGNOSIS — F32A Depression, unspecified: Secondary | ICD-10-CM | POA: Diagnosis present

## 2023-08-30 DIAGNOSIS — G894 Chronic pain syndrome: Secondary | ICD-10-CM | POA: Diagnosis present

## 2023-08-30 DIAGNOSIS — Z9049 Acquired absence of other specified parts of digestive tract: Secondary | ICD-10-CM | POA: Diagnosis not present

## 2023-08-30 DIAGNOSIS — E669 Obesity, unspecified: Secondary | ICD-10-CM | POA: Diagnosis not present

## 2023-08-30 DIAGNOSIS — F1120 Opioid dependence, uncomplicated: Secondary | ICD-10-CM | POA: Diagnosis present

## 2023-08-30 DIAGNOSIS — F4310 Post-traumatic stress disorder, unspecified: Secondary | ICD-10-CM | POA: Diagnosis present

## 2023-08-30 DIAGNOSIS — R0602 Shortness of breath: Secondary | ICD-10-CM | POA: Diagnosis not present

## 2023-08-30 DIAGNOSIS — R918 Other nonspecific abnormal finding of lung field: Secondary | ICD-10-CM | POA: Diagnosis not present

## 2023-08-30 DIAGNOSIS — F411 Generalized anxiety disorder: Secondary | ICD-10-CM | POA: Diagnosis not present

## 2023-08-30 DIAGNOSIS — S42112A Displaced fracture of body of scapula, left shoulder, initial encounter for closed fracture: Secondary | ICD-10-CM | POA: Diagnosis not present

## 2023-08-30 DIAGNOSIS — M0600 Rheumatoid arthritis without rheumatoid factor, unspecified site: Secondary | ICD-10-CM | POA: Diagnosis not present

## 2023-08-30 DIAGNOSIS — Z79899 Other long term (current) drug therapy: Secondary | ICD-10-CM

## 2023-08-30 DIAGNOSIS — S2249XA Multiple fractures of ribs, unspecified side, initial encounter for closed fracture: Secondary | ICD-10-CM | POA: Diagnosis present

## 2023-08-30 DIAGNOSIS — I700 Atherosclerosis of aorta: Secondary | ICD-10-CM | POA: Diagnosis not present

## 2023-08-30 DIAGNOSIS — R0781 Pleurodynia: Secondary | ICD-10-CM | POA: Diagnosis not present

## 2023-08-30 DIAGNOSIS — Z23 Encounter for immunization: Secondary | ICD-10-CM

## 2023-08-30 DIAGNOSIS — E041 Nontoxic single thyroid nodule: Secondary | ICD-10-CM | POA: Diagnosis not present

## 2023-08-30 DIAGNOSIS — M47812 Spondylosis without myelopathy or radiculopathy, cervical region: Secondary | ICD-10-CM | POA: Diagnosis not present

## 2023-08-30 DIAGNOSIS — M530 Cervicocranial syndrome: Secondary | ICD-10-CM | POA: Diagnosis not present

## 2023-08-30 DIAGNOSIS — Z8261 Family history of arthritis: Secondary | ICD-10-CM

## 2023-08-30 DIAGNOSIS — M25511 Pain in right shoulder: Secondary | ICD-10-CM | POA: Diagnosis not present

## 2023-08-30 DIAGNOSIS — S0990XA Unspecified injury of head, initial encounter: Secondary | ICD-10-CM | POA: Diagnosis not present

## 2023-08-30 DIAGNOSIS — I10 Essential (primary) hypertension: Secondary | ICD-10-CM | POA: Diagnosis present

## 2023-08-30 DIAGNOSIS — K219 Gastro-esophageal reflux disease without esophagitis: Secondary | ICD-10-CM | POA: Diagnosis present

## 2023-08-30 DIAGNOSIS — Z87891 Personal history of nicotine dependence: Secondary | ICD-10-CM | POA: Diagnosis not present

## 2023-08-30 DIAGNOSIS — M5136 Other intervertebral disc degeneration, lumbar region: Secondary | ICD-10-CM | POA: Diagnosis not present

## 2023-08-30 DIAGNOSIS — M542 Cervicalgia: Secondary | ICD-10-CM | POA: Diagnosis not present

## 2023-08-30 DIAGNOSIS — J90 Pleural effusion, not elsewhere classified: Secondary | ICD-10-CM | POA: Diagnosis not present

## 2023-08-30 DIAGNOSIS — F1190 Opioid use, unspecified, uncomplicated: Secondary | ICD-10-CM

## 2023-08-30 DIAGNOSIS — R0902 Hypoxemia: Secondary | ICD-10-CM | POA: Diagnosis not present

## 2023-08-30 DIAGNOSIS — M25519 Pain in unspecified shoulder: Secondary | ICD-10-CM | POA: Diagnosis not present

## 2023-08-30 DIAGNOSIS — W109XXA Fall (on) (from) unspecified stairs and steps, initial encounter: Secondary | ICD-10-CM | POA: Diagnosis present

## 2023-08-30 DIAGNOSIS — M25571 Pain in right ankle and joints of right foot: Secondary | ICD-10-CM | POA: Diagnosis not present

## 2023-08-30 DIAGNOSIS — M47814 Spondylosis without myelopathy or radiculopathy, thoracic region: Secondary | ICD-10-CM | POA: Diagnosis not present

## 2023-08-30 DIAGNOSIS — M9901 Segmental and somatic dysfunction of cervical region: Secondary | ICD-10-CM | POA: Diagnosis not present

## 2023-08-30 DIAGNOSIS — S42102A Fracture of unspecified part of scapula, left shoulder, initial encounter for closed fracture: Secondary | ICD-10-CM | POA: Diagnosis not present

## 2023-08-30 DIAGNOSIS — M25512 Pain in left shoulder: Secondary | ICD-10-CM | POA: Diagnosis not present

## 2023-08-30 DIAGNOSIS — R0689 Other abnormalities of breathing: Secondary | ICD-10-CM | POA: Diagnosis not present

## 2023-08-30 DIAGNOSIS — M19012 Primary osteoarthritis, left shoulder: Secondary | ICD-10-CM | POA: Diagnosis not present

## 2023-08-30 DIAGNOSIS — M5412 Radiculopathy, cervical region: Secondary | ICD-10-CM | POA: Diagnosis not present

## 2023-08-30 DIAGNOSIS — S42002A Fracture of unspecified part of left clavicle, initial encounter for closed fracture: Secondary | ICD-10-CM

## 2023-08-30 LAB — CBC WITH DIFFERENTIAL/PLATELET
Abs Immature Granulocytes: 0.11 10*3/uL — ABNORMAL HIGH (ref 0.00–0.07)
Basophils Absolute: 0 10*3/uL (ref 0.0–0.1)
Basophils Relative: 0 %
Eosinophils Absolute: 0.1 10*3/uL (ref 0.0–0.5)
Eosinophils Relative: 1 %
HCT: 44 % (ref 36.0–46.0)
Hemoglobin: 14.7 g/dL (ref 12.0–15.0)
Immature Granulocytes: 1 %
Lymphocytes Relative: 13 %
Lymphs Abs: 1.5 10*3/uL (ref 0.7–4.0)
MCH: 30.2 pg (ref 26.0–34.0)
MCHC: 33.4 g/dL (ref 30.0–36.0)
MCV: 90.3 fL (ref 80.0–100.0)
Monocytes Absolute: 0.8 10*3/uL (ref 0.1–1.0)
Monocytes Relative: 7 %
Neutro Abs: 9.4 10*3/uL — ABNORMAL HIGH (ref 1.7–7.7)
Neutrophils Relative %: 78 %
Platelets: 241 10*3/uL (ref 150–400)
RBC: 4.87 MIL/uL (ref 3.87–5.11)
RDW: 13.4 % (ref 11.5–15.5)
WBC: 12 10*3/uL — ABNORMAL HIGH (ref 4.0–10.5)
nRBC: 0 % (ref 0.0–0.2)

## 2023-08-30 LAB — COMPREHENSIVE METABOLIC PANEL
ALT: 26 U/L (ref 0–44)
AST: 39 U/L (ref 15–41)
Albumin: 4.2 g/dL (ref 3.5–5.0)
Alkaline Phosphatase: 97 U/L (ref 38–126)
Anion gap: 14 (ref 5–15)
BUN: 16 mg/dL (ref 8–23)
CO2: 23 mmol/L (ref 22–32)
Calcium: 9.2 mg/dL (ref 8.9–10.3)
Chloride: 101 mmol/L (ref 98–111)
Creatinine, Ser: 0.52 mg/dL (ref 0.44–1.00)
GFR, Estimated: >60 mL/min (ref >60)
Glucose, Bld: 98 mg/dL (ref 70–99)
Potassium: 3.6 mmol/L (ref 3.5–5.1)
Sodium: 138 mmol/L (ref 135–145)
Total Bilirubin: 1 mg/dL (ref 0.3–1.2)
Total Protein: 7.3 g/dL (ref 6.5–8.1)

## 2023-08-30 MED ORDER — HYDROMORPHONE HCL 1 MG/ML IJ SOLN
0.5000 mg | INTRAMUSCULAR | Status: AC | PRN
  Administered 2023-08-31 (×2): 0.5 mg via INTRAVENOUS
  Filled 2023-08-30 (×2): qty 0.5

## 2023-08-30 MED ORDER — HYDROMORPHONE HCL 1 MG/ML IJ SOLN
0.5000 mg | Freq: Once | INTRAMUSCULAR | Status: AC
  Administered 2023-08-30: 0.5 mg via INTRAVENOUS
  Filled 2023-08-30: qty 0.5

## 2023-08-30 MED ORDER — MORPHINE SULFATE (PF) 4 MG/ML IV SOLN
4.0000 mg | Freq: Once | INTRAVENOUS | Status: AC
  Administered 2023-08-30: 4 mg via INTRAVENOUS
  Filled 2023-08-30: qty 1

## 2023-08-30 MED ORDER — IOHEXOL 300 MG/ML  SOLN
100.0000 mL | Freq: Once | INTRAMUSCULAR | Status: AC | PRN
  Administered 2023-08-30: 100 mL via INTRAVENOUS

## 2023-08-30 MED ORDER — HYDROMORPHONE HCL 1 MG/ML IJ SOLN
1.0000 mg | Freq: Once | INTRAMUSCULAR | Status: AC
  Administered 2023-08-30: 1 mg via INTRAVENOUS
  Filled 2023-08-30: qty 1

## 2023-08-30 NOTE — ED Triage Notes (Signed)
Pt to ED via [facility] from home for fall. Pt fell off of 2-3 steps, falling approx. 4 feet. Pt is not on blood thinners, pt denies LOC. Pt is c/o left shoulder pain, left rib pain, neck pain, right ankle pain and swelling. Pt has a 22 G IV in the right thumb.  Pt was given 4 mg of Zofran and 200 mcg of Fentanyl in route.

## 2023-08-30 NOTE — ED Provider Notes (Signed)
[facility] Provider Note    Event Date/Time   First MD Initiated Contact with Patient 08/30/23 1750     (approximate)   History   No chief complaint on file.   HPI  Emily Landry is a 72 year old female with history of rheumatoid arthritis presenting to the emergency department for evaluation after a fall.  Patient was walking down the stairs at her daughter's house and had forgotten that the rail had been taken down to redo it.  She reached forward and ended up falling off the staircase approximately 4 feet.  Unsure if she hit her head.  No LOC.  Not on anticoagulation.  Since that time, she has had significant pain in her left shoulder, ribs, back.  Also notes pain over her right ankle and neck.     Physical Exam   Triage Vital Signs: ED Triage Vitals  Encounter Vitals Group     BP 08/30/23 1752 (!) 158/81     Systolic BP Percentile --      Diastolic BP Percentile --      Pulse Rate 08/30/23 1752 80     Resp 08/30/23 1752 16     Temp 08/30/23 1753 98.1 F (36.7 C)     Temp Source 08/30/23 1753 Oral     SpO2 08/30/23 1752 100 %     Weight 08/30/23 1751 210 lb (95.3 kg)     Height 08/30/23 1751 5\' 8"  (1.727 m)     Head Circumference --      Peak Flow --      Pain Score 08/30/23 1750 10     Pain Loc --      Pain Education --      Exclude from Growth Chart --     Most recent vital signs: Vitals:   08/30/23 2000 08/30/23 2230  BP: (!) 152/81 139/68  Pulse:  (!) 105  Resp: (!) 24 (!) 22  Temp:    SpO2:  97%   General: Adult female, laying in bed, awake interactive Head: No signs of skull fracture. No signs of facial fracture. Normal jaw alignment with no malocclusion. Eye: Pupils equally round and reactive to light. Visual fields grossly intact. Chest: Symmetric chest rise, tenderness palpation over the left lateral chest wall without crepitus. Cardiac: Regular rhythm and rate. 2+ radial and DP pulses.  Respiratory: Lungs clear to  auscultation Abdomen: Soft, nondistended.  Tenderness to palpation over the left upper abdomen without overlying skin changes.   Pelvis: Stable in AP and lateral compression. No tenderness to palpation. MSK: Tenderness to palpation over the left shoulder with possible deformity, limited assessment due to patient positioning, limited range of motion of left shoulder secondary to pain.  Some tenderness over the right ankle as well but does have relatively intact range of motion, otherwise ranging extremities spontaneously without pain. Back: There is tenderness to palpation in the cervical, lower thoracic, and throughout the lumbar spine. Neuro: Alert, oriented. GCS 15. Pupils equally round and reactive to light.Normal sensation to light touch in bilateral upper and lower extremity. Skin: No evidence of burns or lacerations.   ED Results / Procedures / Treatments   Labs (all labs ordered are listed, but only abnormal results are displayed) Labs Reviewed  CBC WITH DIFFERENTIAL/PLATELET - Abnormal; Notable for the following components:      Result Value   WBC 12.0 (*)    Neutro Abs 9.4 (*)    Abs Immature Granulocytes 0.11 (*)  All other components within normal limits  COMPREHENSIVE METABOLIC PANEL     EKG EKG independently reviewed interpreted by myself (ER attending) demonstrates:    RADIOLOGY Imaging independently reviewed and interpreted by myself demonstrates:  CT head without acute bleed CT C-spine without acute fracture, radiology partially visualizes rib fractures and clavicle fracture CT chest abdomen pelvis with minimally displaced posterior rib fractures of the 2nd through 4th rib with a comminuted and mildly displaced left scapular fracture Shoulder x-Victorina Kable without obvious fracture, questionable scapular abnormality Ankle x-Rayshell Goecke without acute fracture  PROCEDURES:  Critical Care performed: Yes, see critical care procedure note(s)  CRITICAL CARE Performed by: Trinna Post   Total critical care time: 32 minutes  Critical care time was exclusive of separately billable procedures and treating other patients.  Critical care was necessary to treat or prevent imminent or life-threatening deterioration.  Critical care was time spent personally by me on the following activities: development of treatment plan with patient and/or surrogate as well as nursing, discussions with consultants, evaluation of patient's response to treatment, examination of patient, obtaining history from patient or surrogate, ordering and performing treatments and interventions, ordering and review of laboratory studies, ordering and review of radiographic studies, pulse oximetry and re-evaluation of patient's condition.   Procedures   MEDICATIONS ORDERED IN ED: Medications  morphine (PF) 4 MG/ML injection 4 mg (4 mg Intravenous Given 08/30/23 1841)  HYDROmorphone (DILAUDID) injection 0.5 mg (0.5 mg Intravenous Given 08/30/23 2018)  iohexol (OMNIPAQUE) 300 MG/ML solution 100 mL (100 mLs Intravenous Contrast Given 08/30/23 2027)  HYDROmorphone (DILAUDID) injection 1 mg (1 mg Intravenous Given 08/30/23 2132)     IMPRESSION / MDM / ASSESSMENT AND PLAN / ED COURSE  I reviewed the triage vital signs and the nursing notes.  Differential diagnosis includes, but is not limited to, intracranial bleed, spine fracture, rib fracture, pneumothorax, intra-abdominal injury, extremity injury  Patient's presentation is most consistent with acute presentation with potential threat to life or bodily function.  72 year old female presenting to the ER for evaluation after a fall with multiple areas of tenderness.  Given age, mechanism, and multiple areas of tenderness, CT trauma pack was ordered as well as extremity x-rays.  Ordered for morphine for pain control, with significant ongoing pain.  Ordered for Dilaudid with better pain control.  Plain films without obvious injury.  CT scans notable for 3 rib  fractures, scapular fracture, no associated pneumothorax.  After multiple rounds of IV pain medicine, patient does have improved pain control currently.  Given her age and injuries, do think she is appropriate for admission for further pain control and pulmonary toilet.  Discussed with patient who is comfortable with this plan.  Will reach out to hospitalist team to discuss admission.     FINAL CLINICAL IMPRESSION(S) / ED DIAGNOSES   Final diagnoses:  Closed fracture of multiple ribs of left side, initial encounter  Closed displaced fracture of body of left scapula, initial encounter  Closed nondisplaced fracture of left clavicle, unspecified part of clavicle, initial encounter     Rx / DC Orders   ED Discharge Orders     None        Note:  This document was prepared using Dragon voice recognition software and may include unintentional dictation errors.   Trinna Post, MD 08/30/23 2350

## 2023-08-30 NOTE — ED Notes (Signed)
Ice pack given for pt ankle

## 2023-08-30 NOTE — ED Notes (Signed)
Patient transported to CT 

## 2023-08-31 DIAGNOSIS — S2249XA Multiple fractures of ribs, unspecified side, initial encounter for closed fracture: Secondary | ICD-10-CM | POA: Diagnosis present

## 2023-08-31 DIAGNOSIS — Z23 Encounter for immunization: Secondary | ICD-10-CM | POA: Diagnosis present

## 2023-08-31 DIAGNOSIS — Y9301 Activity, walking, marching and hiking: Secondary | ICD-10-CM | POA: Diagnosis present

## 2023-08-31 DIAGNOSIS — S2242XA Multiple fractures of ribs, left side, initial encounter for closed fracture: Secondary | ICD-10-CM

## 2023-08-31 DIAGNOSIS — F411 Generalized anxiety disorder: Secondary | ICD-10-CM | POA: Diagnosis present

## 2023-08-31 DIAGNOSIS — I10 Essential (primary) hypertension: Secondary | ICD-10-CM | POA: Diagnosis present

## 2023-08-31 DIAGNOSIS — S42112A Displaced fracture of body of scapula, left shoulder, initial encounter for closed fracture: Secondary | ICD-10-CM | POA: Diagnosis present

## 2023-08-31 DIAGNOSIS — W109XXA Fall (on) (from) unspecified stairs and steps, initial encounter: Secondary | ICD-10-CM | POA: Diagnosis present

## 2023-08-31 DIAGNOSIS — Z87891 Personal history of nicotine dependence: Secondary | ICD-10-CM | POA: Diagnosis not present

## 2023-08-31 DIAGNOSIS — Z8261 Family history of arthritis: Secondary | ICD-10-CM | POA: Diagnosis not present

## 2023-08-31 DIAGNOSIS — F1120 Opioid dependence, uncomplicated: Secondary | ICD-10-CM | POA: Diagnosis present

## 2023-08-31 DIAGNOSIS — E669 Obesity, unspecified: Secondary | ICD-10-CM | POA: Diagnosis present

## 2023-08-31 DIAGNOSIS — Z6831 Body mass index (BMI) 31.0-31.9, adult: Secondary | ICD-10-CM | POA: Diagnosis not present

## 2023-08-31 DIAGNOSIS — M0600 Rheumatoid arthritis without rheumatoid factor, unspecified site: Secondary | ICD-10-CM | POA: Diagnosis present

## 2023-08-31 DIAGNOSIS — F1190 Opioid use, unspecified, uncomplicated: Secondary | ICD-10-CM

## 2023-08-31 DIAGNOSIS — Z79899 Other long term (current) drug therapy: Secondary | ICD-10-CM | POA: Diagnosis not present

## 2023-08-31 DIAGNOSIS — F4310 Post-traumatic stress disorder, unspecified: Secondary | ICD-10-CM | POA: Diagnosis present

## 2023-08-31 DIAGNOSIS — F32A Depression, unspecified: Secondary | ICD-10-CM | POA: Diagnosis present

## 2023-08-31 DIAGNOSIS — G894 Chronic pain syndrome: Secondary | ICD-10-CM | POA: Diagnosis present

## 2023-08-31 DIAGNOSIS — S42102A Fracture of unspecified part of scapula, left shoulder, initial encounter for closed fracture: Secondary | ICD-10-CM | POA: Insufficient documentation

## 2023-08-31 DIAGNOSIS — K219 Gastro-esophageal reflux disease without esophagitis: Secondary | ICD-10-CM | POA: Diagnosis present

## 2023-08-31 DIAGNOSIS — Z833 Family history of diabetes mellitus: Secondary | ICD-10-CM | POA: Diagnosis not present

## 2023-08-31 LAB — CBC
HCT: 43.3 % (ref 36.0–46.0)
Hemoglobin: 14.3 g/dL (ref 12.0–15.0)
MCH: 30.3 pg (ref 26.0–34.0)
MCHC: 33 g/dL (ref 30.0–36.0)
MCV: 91.7 fL (ref 80.0–100.0)
Platelets: 216 10*3/uL (ref 150–400)
RBC: 4.72 MIL/uL (ref 3.87–5.11)
RDW: 13.7 % (ref 11.5–15.5)
WBC: 9.4 10*3/uL (ref 4.0–10.5)
nRBC: 0 % (ref 0.0–0.2)

## 2023-08-31 LAB — CREATININE, SERUM
Creatinine, Ser: 0.57 mg/dL (ref 0.44–1.00)
GFR, Estimated: >60 mL/min (ref >60)

## 2023-08-31 MED ORDER — METHOTREXATE 2.5 MG PO TABS: 2.5000 mg | ORAL_TABLET | ORAL | Status: DC

## 2023-08-31 MED ORDER — GUAIFENESIN ER 600 MG PO TB12: 600.0000 mg | ORAL_TABLET | Freq: Two times a day (BID) | ORAL | Status: DC | PRN

## 2023-08-31 MED ORDER — METHOCARBAMOL 500 MG PO TABS: 500.0000 mg | ORAL_TABLET | Freq: Four times a day (QID) | ORAL | Status: DC

## 2023-08-31 MED ORDER — ACETAMINOPHEN 500 MG PO TABS
1000.0000 mg | ORAL_TABLET | Freq: Three times a day (TID) | ORAL | Status: DC | PRN
  Administered 2023-08-31 – 2023-09-03 (×3): 1000 mg via ORAL
  Filled 2023-08-31 (×3): qty 2

## 2023-08-31 MED ORDER — OXYCODONE HCL 5 MG PO TABS
5.0000 mg | ORAL_TABLET | ORAL | Status: DC | PRN
  Administered 2023-08-31 – 2023-09-01 (×4): 5 mg via ORAL
  Filled 2023-08-31 (×4): qty 1

## 2023-08-31 MED ORDER — BUPROPION HCL ER (XL) 150 MG PO TB24
150.0000 mg | ORAL_TABLET | Freq: Every day | ORAL | Status: DC
  Administered 2023-08-31 – 2023-09-03 (×4): 150 mg via ORAL
  Filled 2023-08-31 (×4): qty 1

## 2023-08-31 MED ORDER — METHOCARBAMOL 500 MG PO TABS
500.0000 mg | ORAL_TABLET | Freq: Four times a day (QID) | ORAL | Status: DC
  Administered 2023-08-31 – 2023-09-01 (×6): 500 mg via ORAL
  Filled 2023-08-31 (×6): qty 1

## 2023-08-31 MED ORDER — INFLUENZA VAC A&B SURF ANT ADJ 0.5 ML IM SUSY
0.5000 mL | PREFILLED_SYRINGE | INTRAMUSCULAR | Status: AC
  Administered 2023-09-03: 0.5 mL via INTRAMUSCULAR
  Filled 2023-08-31 (×2): qty 0.5

## 2023-08-31 MED ORDER — GABAPENTIN 400 MG PO CAPS
400.0000 mg | ORAL_CAPSULE | Freq: Two times a day (BID) | ORAL | Status: DC
  Administered 2023-08-31 – 2023-09-01 (×2): 400 mg via ORAL
  Filled 2023-08-31 (×3): qty 1

## 2023-08-31 MED ORDER — ONDANSETRON HCL 4 MG/2ML IJ SOLN
4.0000 mg | Freq: Four times a day (QID) | INTRAMUSCULAR | Status: DC | PRN
  Administered 2023-08-31 (×2): 4 mg via INTRAVENOUS
  Filled 2023-08-31 (×2): qty 2

## 2023-08-31 MED ORDER — ACETAMINOPHEN 650 MG RE SUPP: 650.0000 mg | Freq: Four times a day (QID) | RECTAL | Status: DC | PRN

## 2023-08-31 MED ORDER — GUAIFENESIN ER 600 MG PO TB12
600.0000 mg | ORAL_TABLET | Freq: Two times a day (BID) | ORAL | Status: DC
  Administered 2023-08-31 – 2023-09-03 (×6): 600 mg via ORAL
  Filled 2023-08-31 (×7): qty 1

## 2023-08-31 MED ORDER — ACETAMINOPHEN 500 MG PO TABS
1000.0000 mg | ORAL_TABLET | Freq: Three times a day (TID) | ORAL | Status: DC
  Administered 2023-08-31: 1000 mg via ORAL
  Filled 2023-08-31: qty 2

## 2023-08-31 MED ORDER — ONDANSETRON HCL 4 MG PO TABS
4.0000 mg | ORAL_TABLET | Freq: Four times a day (QID) | ORAL | Status: DC | PRN
  Administered 2023-08-31: 4 mg via ORAL
  Filled 2023-08-31: qty 1

## 2023-08-31 MED ORDER — ACETAMINOPHEN 650 MG RE SUPP: 975.0000 mg | Freq: Three times a day (TID) | RECTAL | Status: DC | PRN

## 2023-08-31 MED ORDER — ACETAMINOPHEN 160 MG/5ML PO SOLN
1000.0000 mg | Freq: Three times a day (TID) | ORAL | Status: DC
  Filled 2023-08-31 (×2): qty 40.6

## 2023-08-31 MED ORDER — OXYCODONE HCL 5 MG/5ML PO SOLN: 2.5000 mg | ORAL | Status: DC | PRN

## 2023-08-31 MED ORDER — ACETAMINOPHEN 325 MG RE SUPP: 975.0000 mg | Freq: Three times a day (TID) | RECTAL | Status: DC

## 2023-08-31 MED ORDER — ACETAMINOPHEN 325 MG PO TABS: 650.0000 mg | ORAL_TABLET | Freq: Four times a day (QID) | ORAL | Status: DC | PRN

## 2023-08-31 MED ORDER — HYDROMORPHONE HCL 1 MG/ML IJ SOLN
0.5000 mg | INTRAMUSCULAR | Status: DC | PRN
  Administered 2023-08-31 – 2023-09-01 (×4): 0.5 mg via INTRAVENOUS
  Filled 2023-08-31 (×4): qty 0.5

## 2023-08-31 MED ORDER — ENOXAPARIN SODIUM 60 MG/0.6ML IJ SOSY
45.0000 mg | PREFILLED_SYRINGE | INTRAMUSCULAR | Status: DC
  Administered 2023-09-01 – 2023-09-03 (×3): 45 mg via SUBCUTANEOUS
  Filled 2023-08-31 (×4): qty 0.6

## 2023-08-31 MED ORDER — ACETAMINOPHEN 160 MG/5ML PO SOLN: 1000.0000 mg | Freq: Three times a day (TID) | ORAL | Status: DC | PRN

## 2023-08-31 MED ORDER — MELATONIN 5 MG PO TABS: 5.0000 mg | ORAL_TABLET | Freq: Every evening | ORAL | Status: DC | PRN

## 2023-08-31 MED ORDER — MORPHINE SULFATE (PF) 2 MG/ML IV SOLN
1.0000 mg | INTRAVENOUS | Status: DC | PRN
  Administered 2023-08-31: 2 mg via INTRAVENOUS
  Filled 2023-08-31 (×2): qty 1

## 2023-08-31 MED ORDER — MELATONIN 5 MG PO TABS
5.0000 mg | ORAL_TABLET | Freq: Every evening | ORAL | Status: DC | PRN
  Administered 2023-09-03: 5 mg via ORAL
  Filled 2023-08-31: qty 1

## 2023-08-31 MED ORDER — DULOXETINE HCL 30 MG PO CPEP
90.0000 mg | ORAL_CAPSULE | Freq: Every day | ORAL | Status: DC
  Administered 2023-09-01 – 2023-09-03 (×3): 90 mg via ORAL
  Filled 2023-08-31 (×4): qty 3

## 2023-08-31 MED ORDER — OXYCODONE HCL 5 MG PO TABS
2.5000 mg | ORAL_TABLET | ORAL | Status: DC | PRN
  Administered 2023-08-31: 5 mg via ORAL
  Filled 2023-08-31: qty 1

## 2023-08-31 MED ORDER — PANTOPRAZOLE SODIUM 20 MG PO TBEC
20.0000 mg | DELAYED_RELEASE_TABLET | Freq: Every day | ORAL | Status: DC
  Administered 2023-09-01 – 2023-09-03 (×3): 20 mg via ORAL
  Filled 2023-08-31 (×4): qty 1

## 2023-08-31 MED ORDER — METHOTREXATE 2.5 MG PO TABS
10.0000 mg | ORAL_TABLET | ORAL | Status: DC
  Filled 2023-08-31 (×2): qty 4

## 2023-08-31 NOTE — Assessment & Plan Note (Addendum)
Chronic pain syndrome Continue home methotrexate Continue duloxetine, gabapentin

## 2023-08-31 NOTE — Evaluation (Addendum)
Physical Therapy Evaluation Patient Details Name: Emily Landry MRN: [id number] DOB: 07/31/1951 Today's Date: 08/31/2023  History of Present Illness  Pt is a 72 y/o female presenting following a fall down 3-4 stairs and complaints of L shoulder, L rib, and R ankle pain. Imaging revealing minimally displaced left posterior second through fourth rib fractures, comminuted and minimally displaced left scapular fracture, and no ankle fracture. PMH includes rheumatoid arthritis, anxiety, lumbar laminectomy, chest pain, and HTN.  Clinical Impression   Pt presents laying in bed with daughters in room, in a lot of pain but unable to verbalize a number. She lives with her daughter in a house with 3 stairs to enter and no rail. PTA she was IND with mobility and ADLs.   Pt performed supine<>sit and sit<>stand modAx2 for safety and to maintain NWB precautions. She lateral stepped 5x toward [facility] with minAx2 and maximal encouragement from therapist/daughter due to pain. Pt extremely limited by pain during today's session and tolerance to upright standing. She would benefit from continued skilled therapy to maximize functional abilities.      If plan is discharge home, recommend the following: A lot of help with walking and/or transfers;A lot of help with bathing/dressing/bathroom;Assistance with cooking/housework;Assist for transportation;Help with stairs or ramp for entrance;Direct supervision/assist for medications management   Can travel by private vehicle        Equipment Recommendations None recommended by PT  Recommendations for Other Services       Functional Status Assessment Patient has had a recent decline in their functional status and demonstrates the ability to make significant improvements in function in a reasonable and predictable amount of time.     Precautions / Restrictions Precautions Precautions: Shoulder Shoulder Interventions: Shoulder sling/immobilizer Required Braces or  Orthoses: Sling Restrictions Weight Bearing Restrictions: Yes LUE Weight Bearing: Non weight bearing      Mobility  Bed Mobility Overal bed mobility: Needs Assistance Bed Mobility: Supine to Sit, Sit to Supine     Supine to sit: Mod assist, +2 for safety/equipment Sit to supine: Mod assist, +2 for safety/equipment        Transfers Overall transfer level: Needs assistance Equipment used: None Transfers: Sit to/from Stand Sit to Stand: Mod assist, +2 physical assistance           General transfer comment: Pt required maximal encouragement and assist from daughter to stand. Pt able to perform lateral stepping toward [facility] with minAx2 for safety.    Ambulation/Gait               General Gait Details: unable due to pain  Stairs            Wheelchair Mobility     Tilt Bed    Modified Rankin (Stroke Patients Only)       Balance Overall balance assessment: Needs assistance Sitting-balance support: Feet unsupported, Single extremity supported Sitting balance-Leahy Scale: Good     Standing balance support: Single extremity supported Standing balance-Leahy Scale: Poor Standing balance comment: Pt required minA for standing, standing balance limited 2/2 pain                             Pertinent Vitals/Pain Pain Assessment Pain Assessment: Faces Faces Pain Scale: Hurts whole lot Pain Location: Ribs/ LUE Pain Descriptors / Indicators: Grimacing, Moaning, Crying, Constant, Sharp Pain Intervention(s): Limited activity within patient's tolerance, Monitored during session, Patient requesting pain meds-RN notified    Home Living Family/patient expects  to be discharged to:: Private residence Living Arrangements: Children Available Help at Discharge: Family Type of Home: House Home Access: Stairs to enter Entrance Stairs-Rails: None Secretary/administrator of Steps: 3     Home Equipment: Shower seat - built in;BSC/3in1;Cane - single point       Prior Function Prior Level of Function : Independent/Modified Independent             Mobility Comments: does not use AD at baseline ADLs Comments: IND with all ADLs     Extremity/Trunk Assessment   Upper Extremity Assessment Upper Extremity Assessment: LUE deficits/detail LUE: Unable to fully assess due to pain;Shoulder pain at rest    Lower Extremity Assessment Lower Extremity Assessment: Overall WFL for tasks assessed       Communication   Communication Communication: No apparent difficulties  Cognition Arousal: Alert Behavior During Therapy: WFL for tasks assessed/performed Overall Cognitive Status: Within Functional Limits for tasks assessed                                          General Comments      Exercises     Assessment/Plan    PT Assessment Patient needs continued PT services  PT Problem List Decreased strength;Decreased range of motion;Decreased activity tolerance;Decreased balance;Decreased mobility;Decreased knowledge of use of DME;Decreased safety awareness;Decreased knowledge of precautions;Pain       PT Treatment Interventions DME instruction;Gait training;Stair training;Functional mobility training;Therapeutic activities;Therapeutic exercise;Balance training;Neuromuscular re-education;Patient/family education    PT Goals (Current goals can be found in the Care Plan section)  Acute Rehab PT Goals Patient Stated Goal: return home PT Goal Formulation: With patient Time For Goal Achievement: 09/14/23 Potential to Achieve Goals: Good    Frequency Min 1X/week     Co-evaluation               AM-PAC PT "6 Clicks" Mobility  Outcome Measure Help needed turning from your back to your side while in a flat bed without using bedrails?: A Lot Help needed moving from lying on your back to sitting on the side of a flat bed without using bedrails?: A Lot Help needed moving to and from a bed to a chair (including a  wheelchair)?: A Lot Help needed standing up from a chair using your arms (e.g., wheelchair or bedside chair)?: A Lot Help needed to walk in hospital room?: A Lot Help needed climbing 3-5 steps with a railing? : Total 6 Click Score: 11    End of Session   Activity Tolerance: Patient limited by pain Patient left: in bed;with call bell/phone within reach;with nursing/sitter in room   PT Visit Diagnosis: Other abnormalities of gait and mobility (R26.89);History of falling (Z91.81);Pain Pain - Right/Left: Left Pain - part of body: Shoulder (Ribs)    Time: 1324-4010 PT Time Calculation (min) (ACUTE ONLY): 41 min   Charges:   PT Evaluation $PT Eval Low Complexity: 1 Low PT Treatments $Therapeutic Activity: 23-37 mins PT General Charges $$ ACUTE PT VISIT: 1 Visit        Marchele Decock, PT, SPT 3:48 PM,08/31/23

## 2023-08-31 NOTE — ED Notes (Signed)
Patient resting with eyes closed. Respirations even and unlabored. NAD noted.

## 2023-08-31 NOTE — Assessment & Plan Note (Signed)
Continue home meds, pending med rec

## 2023-08-31 NOTE — H&P (Addendum)
History and Physical    Patient: Emily Landry WJX:[id number] DOB: 1951-12-12 DOA: 08/30/2023 DOS: the patient was seen and examined on 08/31/2023 PCP: Kandyce Rud, MD  Patient coming from: Home  Chief Complaint: Fall down stairs  HPI: Emily Landry is a 72 y.o. female with medical history significant for Rheumatoid arthritis, anxiety, lumbar laminectomy with chronic pain on chronic opiates, HTN who presents to the ED following an accidental fall while walking down the stairs.  States the drywall with a handrail at the side of the staircase was removed for elevation and she fell sideways of the staircase from the height of about the third or fourth step from the bottom of the staircase.  Had no loss of consciousness.  She presented with left shoulder pain left rib pain and back pain as well as right ankle pain and neck pain.  She was previously in her usual state of health ED course and data review: Vitals initially within normal limits and without hypoxia, but did become tachycardic and tachypneic as the night progressed and briefly dropped O2 sat to 87% Labs notable for WBC of 12,000 otherwise CBC and CMP unremarkable Trauma imaging included CT head, C-spine, chest abdomen and pelvis as well as x-ray left shoulder and ankle and was significant for: IMPRESSION: 1. Minimally displaced left posterior second through fourth rib fractures. 2. Comminuted and minimally displaced left scapular fracture. 3. Trace left pleural effusion. 4. No acute intra-abdominal or intrapelvic process.  Patient was treated with hydromorphone for pain Hospitalist consulted for admission.   Review of Systems: As mentioned in the history of present illness. All other systems reviewed and are negative.  Past Medical History:  Diagnosis Date   Anxiety    Arthritis    Chronic back pain    Depression    Elevated blood pressure reading    GERD (gastroesophageal reflux disease)    History of hiatal hernia     PONV (postoperative nausea and vomiting)    x 1 with Hysterectomy   PTSD (post-traumatic stress disorder)    Refusal of blood transfusions as patient is Jehovah's Witness    Past Surgical History:  Procedure Laterality Date   ABDOMINAL HYSTERECTOMY     no longer has cervix   CHOLECYSTECTOMY     COLONOSCOPY     LAMINECTOMY  1971   LUMBAR DISC SURGERY  1991   LUMBAR LAMINECTOMY/DECOMPRESSION MICRODISCECTOMY N/A 05/20/2022   Procedure: L2-3 AND L4-5 DECOMPRESSION, RIGHT L3-4 AND L5-S1 MICRODISCECTOMIES;  Surgeon: Venetia Night, MD;  Location: [facility] ORS;  Service: Neurosurgery;  Laterality: N/A;   TONSILLECTOMY     Social History:  reports that she has quit smoking. Her smoking use included cigarettes. She has never used smokeless tobacco. She reports current alcohol use. She reports that she does not use drugs.  No Known Allergies  Family History  Problem Relation Age of Onset   Breast cancer Mother 64   Diabetes Mother    Rheum arthritis Mother    Arthritis Father    Gout Father    Alcohol abuse Father     Prior to Admission medications   Medication Sig Start Date End Date Taking? Authorizing Provider  benzonatate (TESSALON) 100 MG capsule Take 100 mg by mouth 3 (three) times daily as needed. 02/15/23   [provider]  buPROPion (WELLBUTRIN XL) 150 MG 24 hr tablet Take 3 tablets (450 mg total) by mouth daily. 06/18/22 12/24/22  Edward Jolly, MD  buPROPion (WELLBUTRIN XL) 150 MG 24 hr tablet TAKE  THREE TABLETS BY MOUTH ONE TIME DAILY 01/29/23   [provider]  cevimeline (EVOXAC) 30 MG capsule Take 30 mg by mouth 3 (three) times daily. 02/03/23   [provider]  DULoxetine (CYMBALTA) 60 MG capsule Take 1 capsule (60 mg total) by mouth daily. 03/16/23 09/12/23  Edward Jolly, MD  folic acid (FOLVITE) 1 MG tablet Take 1 mg by mouth daily. 09/09/20   [provider]  gabapentin (NEURONTIN) 400 MG capsule Take 1 capsule (400 mg total) by mouth 2 (two)  times daily. 03/16/23 06/14/23  Edward Jolly, MD  ibandronate (BONIVA) 150 MG tablet Take 150 mg by mouth every 30 (thirty) days. 12/15/22 12/15/23  [provider]  methotrexate (RHEUMATREX) 2.5 MG tablet Take 2.5 mg by mouth once a week. 4 tablets on Wednesdays 09/09/20   [provider]  pantoprazole (PROTONIX) 20 MG tablet Take 20 mg by mouth daily.    [provider]    Physical Exam: Vitals:   08/30/23 1800 08/30/23 2000 08/30/23 2230 08/31/23 0005  BP: (!) 154/90 (!) 152/81 139/68 121/76  Pulse: 80  (!) 105 (!) 105  Resp: (!) 23 (!) 24 (!) 22 16  Temp:    97.8 F (36.6 C)  TempSrc:    Oral  SpO2: 100%  97% 94%  Weight:      Height:       Physical Exam Vitals and nursing note reviewed.  Constitutional:      General: She is not in acute distress.    Comments: Appears uncomfortable from pain  HENT:     Head: Normocephalic and atraumatic.  Cardiovascular:     Rate and Rhythm: Regular rhythm. Tachycardia present.     Heart sounds: Normal heart sounds.  Pulmonary:     Effort: Tachypnea present.     Breath sounds: Normal breath sounds.  Abdominal:     Palpations: Abdomen is soft.     Tenderness: There is no abdominal tenderness.  Neurological:     Mental Status: Mental status is at baseline.     Labs on Admission: I have personally reviewed following labs and imaging studies  CBC: Recent Labs  Lab 08/30/23 1847  WBC 12.0*  NEUTROABS 9.4*  HGB 14.7  HCT 44.0  MCV 90.3  PLT 241   Basic Metabolic Panel: Recent Labs  Lab 08/30/23 1847  NA 138  K 3.6  CL 101  CO2 23  GLUCOSE 98  BUN 16  CREATININE 0.52  CALCIUM 9.2   GFR: Estimated Creatinine Clearance: 76.8 mL/min (by C-G formula based on SCr of 0.52 mg/dL). Liver Function Tests: Recent Labs  Lab 08/30/23 1847  AST 39  ALT 26  ALKPHOS 97  BILITOT 1.0  PROT 7.3  ALBUMIN 4.2   No results for input(s): "LIPASE", "AMYLASE" in the last 168 hours. No results for input(s):  "AMMONIA" in the last 168 hours. Coagulation Profile: No results for input(s): "INR", "PROTIME" in the last 168 hours. Cardiac Enzymes: No results for input(s): "CKTOTAL", "CKMB", "CKMBINDEX", "TROPONINI" in the last 168 hours. BNP (last 3 results) No results for input(s): "PROBNP" in the last 8760 hours. HbA1C: No results for input(s): "HGBA1C" in the last 72 hours. CBG: No results for input(s): "GLUCAP" in the last 168 hours. Lipid Profile: No results for input(s): "CHOL", "HDL", "LDLCALC", "TRIG", "CHOLHDL", "LDLDIRECT" in the last 72 hours. Thyroid Function Tests: No results for input(s): "TSH", "T4TOTAL", "FREET4", "T3FREE", "THYROIDAB" in the last 72 hours. Anemia Panel: No results for input(s): "VITAMINB12", "  FOLATE", "FERRITIN", "TIBC", "IRON", "RETICCTPCT" in the last 72 hours. Urine analysis:    Component Value Date/Time   COLORURINE YELLOW (A) 05/14/2022 1512   APPEARANCEUR CLEAR (A) 05/14/2022 1512   LABSPEC 1.012 05/14/2022 1512   PHURINE 6.0 05/14/2022 1512   GLUCOSEU NEGATIVE 05/14/2022 1512   HGBUR NEGATIVE 05/14/2022 1512   BILIRUBINUR NEGATIVE 05/14/2022 1512   KETONESUR NEGATIVE 05/14/2022 1512   PROTEINUR NEGATIVE 05/14/2022 1512   NITRITE NEGATIVE 05/14/2022 1512   LEUKOCYTESUR NEGATIVE 05/14/2022 1512    Radiological Exams on Admission: CT L-SPINE NO CHARGE  Result Date: 08/30/2023 CLINICAL DATA:  Fall injury with back pain, left rib cage and left clavicle fractures and left scapular fracture. EXAM: CT THORACIC AND LUMBAR SPINE WITHOUT CONTRAST TECHNIQUE: Multidetector CT imaging of the thoracic and lumbar spine was performed without intravenous contrast. Multiplanar CT image reconstructions were also generated. RADIATION DOSE REDUCTION: This exam was performed according to the departmental dose-optimization program which includes automated exposure control, adjustment of the mA and/or kV according to patient size and/or use of iterative reconstruction  technique. COMPARISON:  MRI thoracic spine 11/02/2020, MRI lumbar spine 04/12/2022. FINDINGS: CT THORACIC SPINE FINDINGS Alignment: There is 3 mm cortical retropulsion in reference to chronic appearing compression fractures of T9 and 11. There is mild thoracic kyphosis and slight thoracic dextroscoliosis. No acute traumatic listhesis or other alignment abnormality is seen. Vertebrae: Generalized osteopenia. There are T9 and 11 bowtie shaped compression fractures both with a chronic appearance new since 2021, with the greatest height loss centrally, at T9 with anterior height loss 50% and posterior height loss 40%, at T11 with anterior height loss 40% and posterior height loss 30%. There are anterior bridging osteophytes from T9 up to T8, marginal endplate spurring of T11 and 3 mm posterior cortical retropulsion at both levels. There is no cortical fragmentation or step-off is seen at either level. The other vertebrae are normal in heights. There is thoracic spondylosis but no primary pathologic process is seen. Paraspinal and other soft tissues: No paraspinal hematoma is seen. Disc levels: No herniated discs or cord compromise are suspected allowing for lack of intrathecal contrast. There is mild flattening of the ventral thecal sac due to retropulsion at T9 and T11 but no secondary mass effect is seen. Due to the chronic compression fractures there is moderate bilateral foraminal stenosis at T8-9 and T9-10, and moderate to severe left and moderate right foraminal stenosis at T10-11. The other thoracic spine foramina are clear. Other: There are nondisplaced posterior fractures of the left second, third and fourth ribs and slightly displaced fractures of the posteromedial sixth through ninth left ribs. CT LUMBAR SPINE FINDINGS Segmentation: Standard. Alignment: There is a mild levoscoliosis apex at L4. Otherwise negative lumbar alignment. Vertebrae: There is osteopenia. No acute fracture or primary pathologic process  is seen. There are mild-to-moderate chronic compression fractures of L1, L2 and L3. L4 and 5 are normal in heights. These fractures were first seen on the MRI 04/12/2022. There is moderate marginal osteophytosis of the lumbar spine. Spinous process abutment with opposing surface spurring and sclerosis is noted at L2-3 and L3-4, to a lesser extent L4-5. There is reactive sclerosis in the L4 and L5 vertebral bodies which is believed to be due to discogenic degenerative arthrosis but has progressed since 04/12/2022. No endplate destructive changes are seen. Paraspinal and other soft tissues: No paraspinal mass or hematoma. Aortic atherosclerosis. Disc levels: T12-L1: Slight disc space loss.  No herniation or stenosis. L1-2: Normal disc height.  There is a mild posterior disc osteophyte complex but no herniation or canal stenosis. Due to facet spurring and foraminal disc bulging there is unilateral moderate to severe left foraminal stenosis. L2-3: There is mild disc space loss with vacuum disc phenomenon. Broad posterior disc osteophyte complex and dorsal ligamentous thickening cause mild-to-moderate spinal canal stenosis. Facet joint spurs and spondylosis causes moderate unilateral left foraminal stenosis. L3-4: There is moderate disc space loss with vacuum phenomenon. Posterior disc osteophyte complex and dorsal ligamentous thickening in combination causing moderate spinal canal stenosis. Facet spurring causes mild left, moderate to severe right foraminal stenosis. L4-5: There is complete degenerative disc collapse. Circumferential disc osteophyte complex. There is mild spinal canal stenosis, with effaced lateral recesses. Mild-to-moderate facet joint spurring and spondylosis cause moderate to severe right and moderate left foraminal stenosis. L5-S1: This disc is also collapsed. Posterior disc osteophyte complex causes mild-to-moderate spinal stenosis and lateralizes to the right where there is posterior displacement  and compression of the right S1 nerve root, slight compression of the left. Facet spurs and spondylosis cause bilateral moderate to severe foraminal stenosis. The visualized SI joints are patent. The visualized sacrum is intact. IMPRESSION: 1. Chronic appearing compression fractures of T9 and T11 with 3 mm posterior cortical retropulsion at both levels but no secondary findings of an acute fracture. 2. Thoracic spine degenerative changes and minimal to mild kyphodextroscoliosis. 3. Multiple left-sided rib fractures. 4. Chronic compression fractures of L1, L2 and L3. 5. Osteopenia, degenerative change and mild lumbar levoscoliosis, without evidence for acute lumbar fracture. 6. Spinous process abutment with opposing surface spurring and sclerosis at L2-3 and L3-4, to a lesser extent L4-5. 7. L5-S1 disc osteophyte complex lateralizes to the right where there is posterior displacement and compression of the right S1 nerve root, slight compression of the left S1 nerve root. 8. Aortic atherosclerosis and remaining detailed findings described above. Aortic Atherosclerosis (ICD10-I70.0). Electronically Signed   By: Almira Bar M.D.   On: 08/30/2023 22:57   CT T-SPINE NO CHARGE  Result Date: 08/30/2023 CLINICAL DATA:  Fall injury with back pain, left rib cage and left clavicle fractures and left scapular fracture. EXAM: CT THORACIC AND LUMBAR SPINE WITHOUT CONTRAST TECHNIQUE: Multidetector CT imaging of the thoracic and lumbar spine was performed without intravenous contrast. Multiplanar CT image reconstructions were also generated. RADIATION DOSE REDUCTION: This exam was performed according to the departmental dose-optimization program which includes automated exposure control, adjustment of the mA and/or kV according to patient size and/or use of iterative reconstruction technique. COMPARISON:  MRI thoracic spine 11/02/2020, MRI lumbar spine 04/12/2022. FINDINGS: CT THORACIC SPINE FINDINGS Alignment: There is 3 mm  cortical retropulsion in reference to chronic appearing compression fractures of T9 and 11. There is mild thoracic kyphosis and slight thoracic dextroscoliosis. No acute traumatic listhesis or other alignment abnormality is seen. Vertebrae: Generalized osteopenia. There are T9 and 11 bowtie shaped compression fractures both with a chronic appearance new since 2021, with the greatest height loss centrally, at T9 with anterior height loss 50% and posterior height loss 40%, at T11 with anterior height loss 40% and posterior height loss 30%. There are anterior bridging osteophytes from T9 up to T8, marginal endplate spurring of T11 and 3 mm posterior cortical retropulsion at both levels. There is no cortical fragmentation or step-off is seen at either level. The other vertebrae are normal in heights. There is thoracic spondylosis but no primary pathologic process is seen. Paraspinal and other soft tissues: No paraspinal hematoma is seen.  Disc levels: No herniated discs or cord compromise are suspected allowing for lack of intrathecal contrast. There is mild flattening of the ventral thecal sac due to retropulsion at T9 and T11 but no secondary mass effect is seen. Due to the chronic compression fractures there is moderate bilateral foraminal stenosis at T8-9 and T9-10, and moderate to severe left and moderate right foraminal stenosis at T10-11. The other thoracic spine foramina are clear. Other: There are nondisplaced posterior fractures of the left second, third and fourth ribs and slightly displaced fractures of the posteromedial sixth through ninth left ribs. CT LUMBAR SPINE FINDINGS Segmentation: Standard. Alignment: There is a mild levoscoliosis apex at L4. Otherwise negative lumbar alignment. Vertebrae: There is osteopenia. No acute fracture or primary pathologic process is seen. There are mild-to-moderate chronic compression fractures of L1, L2 and L3. L4 and 5 are normal in heights. These fractures were first  seen on the MRI 04/12/2022. There is moderate marginal osteophytosis of the lumbar spine. Spinous process abutment with opposing surface spurring and sclerosis is noted at L2-3 and L3-4, to a lesser extent L4-5. There is reactive sclerosis in the L4 and L5 vertebral bodies which is believed to be due to discogenic degenerative arthrosis but has progressed since 04/12/2022. No endplate destructive changes are seen. Paraspinal and other soft tissues: No paraspinal mass or hematoma. Aortic atherosclerosis. Disc levels: T12-L1: Slight disc space loss.  No herniation or stenosis. L1-2: Normal disc height. There is a mild posterior disc osteophyte complex but no herniation or canal stenosis. Due to facet spurring and foraminal disc bulging there is unilateral moderate to severe left foraminal stenosis. L2-3: There is mild disc space loss with vacuum disc phenomenon. Broad posterior disc osteophyte complex and dorsal ligamentous thickening cause mild-to-moderate spinal canal stenosis. Facet joint spurs and spondylosis causes moderate unilateral left foraminal stenosis. L3-4: There is moderate disc space loss with vacuum phenomenon. Posterior disc osteophyte complex and dorsal ligamentous thickening in combination causing moderate spinal canal stenosis. Facet spurring causes mild left, moderate to severe right foraminal stenosis. L4-5: There is complete degenerative disc collapse. Circumferential disc osteophyte complex. There is mild spinal canal stenosis, with effaced lateral recesses. Mild-to-moderate facet joint spurring and spondylosis cause moderate to severe right and moderate left foraminal stenosis. L5-S1: This disc is also collapsed. Posterior disc osteophyte complex causes mild-to-moderate spinal stenosis and lateralizes to the right where there is posterior displacement and compression of the right S1 nerve root, slight compression of the left. Facet spurs and spondylosis cause bilateral moderate to severe  foraminal stenosis. The visualized SI joints are patent. The visualized sacrum is intact. IMPRESSION: 1. Chronic appearing compression fractures of T9 and T11 with 3 mm posterior cortical retropulsion at both levels but no secondary findings of an acute fracture. 2. Thoracic spine degenerative changes and minimal to mild kyphodextroscoliosis. 3. Multiple left-sided rib fractures. 4. Chronic compression fractures of L1, L2 and L3. 5. Osteopenia, degenerative change and mild lumbar levoscoliosis, without evidence for acute lumbar fracture. 6. Spinous process abutment with opposing surface spurring and sclerosis at L2-3 and L3-4, to a lesser extent L4-5. 7. L5-S1 disc osteophyte complex lateralizes to the right where there is posterior displacement and compression of the right S1 nerve root, slight compression of the left S1 nerve root. 8. Aortic atherosclerosis and remaining detailed findings described above. Aortic Atherosclerosis (ICD10-I70.0). Electronically Signed   By: Almira Bar M.D.   On: 08/30/2023 22:57   CT Head Wo Contrast  Result Date: 08/30/2023 CLINICAL  DATA:  The patient fell down 2-3 stair steps, with neck pain, unknown head trauma, left chest and shoulder trauma. EXAM: CT HEAD WITHOUT CONTRAST CT CERVICAL SPINE WITHOUT CONTRAST TECHNIQUE: Multidetector CT imaging of the head and cervical spine was performed following the standard protocol without intravenous contrast. Multiplanar CT image reconstructions of the cervical spine were also generated. RADIATION DOSE REDUCTION: This exam was performed according to the departmental dose-optimization program which includes automated exposure control, adjustment of the mA and/or kV according to patient size and/or use of iterative reconstruction technique. COMPARISON:  Brain MRI 09/29/2022. No prior cervical spine images were plain films. FINDINGS: CT HEAD FINDINGS Brain: There is mild global atrophy, slight atrophic ventriculomegaly, and  mild-to-moderate small-vessel disease of the cerebral white matter. No acute cortical based infarct, hemorrhage, mass or mass effect, or midline shift are seen. There are bilateral calcifications scattered along the falx. Basal cisterns are clear. Vascular: No hyperdense vessel or unexpected calcification. Skull: Negative for fractures or focal lesions. No visible scalp hematoma. Sinuses/Orbits: There is mild membrane thickening in the maxillary and ethmoid sinuses. The sphenoid and frontal sinus, bilateral mastoid air cells and middle ears are clear. The nasal septum is midline. Negative orbits. Other: None. CT CERVICAL SPINE FINDINGS Alignment: There is a straightened cervical lordosis and mild dextroscoliosis, without evidence of listhesis. Narrowing and osteophytes are present of the anterior atlantodental joint with calcified pannus in the posterior joint space. Skull base and vertebrae: There is osteopenia. No acute fracture is evident. No primary bone lesion or focal pathologic process. Soft tissues and spinal canal: No prevertebral fluid or swelling. No visible canal hematoma. There is a 1.3 cm heterogeneous nodule in the right lobe of the thyroid gland. This does not meet size criteria for imaging follow-up (Reference: J Am Coll Radiol. 2015 Feb;12(2): 143-50). No laryngeal mass is seen. Disc levels: There is near complete degenerative disc space loss C5-6 and C6-7, with small bidirectional osteophytes. Posterior disc osteophyte complex C5-6 flattens the ventral cord surface narrowing the thecal sac to 6.8 mm AP. Posterior disc osteophyte complex at C6-7 mildly deforms the ventral cord surface. Other levels show preservation of normal disc heights. There are posterior disc bulges without significant mass effect at C2-3 and C3-4. Multiple levels demonstrate mild facet joint and uncinate spurring. Foraminal stenosis is noted moderately on the right at C5-6, and mild bilaterally at C6-7 and is not seen at  other cervical levels. Upper chest: There are nondisplaced fractures of left posterior second and third ribs, and a partially visible nondisplaced fracture of the proximal shaft of the left clavicle. No apical pneumothorax. Other: None. IMPRESSION: 1. No acute intracranial CT findings or depressed skull fractures. 2. Atrophy and small-vessel disease. 3. Sinus membrane disease. 4. Osteopenia and degenerative change of the cervical spine without evidence of fractures or listhesis. Straightened lordosis with mild dextroscoliosis. 5. Nondisplaced fractures of the left posterior second and third ribs, and partially visible nondisplaced fracture of the proximal shaft of the left clavicle. No apical pneumothorax. 6. 1.3 cm heterogeneous nodule in the right lobe of the thyroid gland. This does not meet size criteria for imaging follow-up. Electronically Signed   By: Almira Bar M.D.   On: 08/30/2023 22:20   CT Cervical Spine Wo Contrast  Result Date: 08/30/2023 CLINICAL DATA:  The patient fell down 2-3 stair steps, with neck pain, unknown head trauma, left chest and shoulder trauma. EXAM: CT HEAD WITHOUT CONTRAST CT CERVICAL SPINE WITHOUT CONTRAST TECHNIQUE: Multidetector CT imaging of  the head and cervical spine was performed following the standard protocol without intravenous contrast. Multiplanar CT image reconstructions of the cervical spine were also generated. RADIATION DOSE REDUCTION: This exam was performed according to the departmental dose-optimization program which includes automated exposure control, adjustment of the mA and/or kV according to patient size and/or use of iterative reconstruction technique. COMPARISON:  Brain MRI 09/29/2022. No prior cervical spine images were plain films. FINDINGS: CT HEAD FINDINGS Brain: There is mild global atrophy, slight atrophic ventriculomegaly, and mild-to-moderate small-vessel disease of the cerebral white matter. No acute cortical based infarct, hemorrhage, mass  or mass effect, or midline shift are seen. There are bilateral calcifications scattered along the falx. Basal cisterns are clear. Vascular: No hyperdense vessel or unexpected calcification. Skull: Negative for fractures or focal lesions. No visible scalp hematoma. Sinuses/Orbits: There is mild membrane thickening in the maxillary and ethmoid sinuses. The sphenoid and frontal sinus, bilateral mastoid air cells and middle ears are clear. The nasal septum is midline. Negative orbits. Other: None. CT CERVICAL SPINE FINDINGS Alignment: There is a straightened cervical lordosis and mild dextroscoliosis, without evidence of listhesis. Narrowing and osteophytes are present of the anterior atlantodental joint with calcified pannus in the posterior joint space. Skull base and vertebrae: There is osteopenia. No acute fracture is evident. No primary bone lesion or focal pathologic process. Soft tissues and spinal canal: No prevertebral fluid or swelling. No visible canal hematoma. There is a 1.3 cm heterogeneous nodule in the right lobe of the thyroid gland. This does not meet size criteria for imaging follow-up (Reference: J Am Coll Radiol. 2015 Feb;12(2): 143-50). No laryngeal mass is seen. Disc levels: There is near complete degenerative disc space loss C5-6 and C6-7, with small bidirectional osteophytes. Posterior disc osteophyte complex C5-6 flattens the ventral cord surface narrowing the thecal sac to 6.8 mm AP. Posterior disc osteophyte complex at C6-7 mildly deforms the ventral cord surface. Other levels show preservation of normal disc heights. There are posterior disc bulges without significant mass effect at C2-3 and C3-4. Multiple levels demonstrate mild facet joint and uncinate spurring. Foraminal stenosis is noted moderately on the right at C5-6, and mild bilaterally at C6-7 and is not seen at other cervical levels. Upper chest: There are nondisplaced fractures of left posterior second and third ribs, and a  partially visible nondisplaced fracture of the proximal shaft of the left clavicle. No apical pneumothorax. Other: None. IMPRESSION: 1. No acute intracranial CT findings or depressed skull fractures. 2. Atrophy and small-vessel disease. 3. Sinus membrane disease. 4. Osteopenia and degenerative change of the cervical spine without evidence of fractures or listhesis. Straightened lordosis with mild dextroscoliosis. 5. Nondisplaced fractures of the left posterior second and third ribs, and partially visible nondisplaced fracture of the proximal shaft of the left clavicle. No apical pneumothorax. 6. 1.3 cm heterogeneous nodule in the right lobe of the thyroid gland. This does not meet size criteria for imaging follow-up. Electronically Signed   By: Almira Bar M.D.   On: 08/30/2023 22:20   CT CHEST ABDOMEN PELVIS W CONTRAST  Result Date: 08/30/2023 CLINICAL DATA:  Larey Seat down 2-3 steps, left rib and shoulder pain EXAM: CT CHEST, ABDOMEN, AND PELVIS WITH CONTRAST TECHNIQUE: Multidetector CT imaging of the chest, abdomen and pelvis was performed following the standard protocol during bolus administration of intravenous contrast. RADIATION DOSE REDUCTION: This exam was performed according to the departmental dose-optimization program which includes automated exposure control, adjustment of the mA and/or kV according to patient size  and/or use of iterative reconstruction technique. CONTRAST:  OMNIPAQUE IOHEXOL 300 MG/ML  SOLN COMPARISON:  12/31/2022 FINDINGS: CT CHEST FINDINGS Cardiovascular: The heart is unremarkable without pericardial effusion. No evidence of thoracic aortic aneurysm or dissection. No evidence of vascular injury. Mediastinum/Nodes: No enlarged mediastinal, hilar, or axillary lymph nodes. Thyroid gland, trachea, and esophagus demonstrate no significant findings. Lungs/Pleura: Trace left pleural fluid. No airspace disease or pneumothorax. Central airways are patent. Musculoskeletal: There are  minimally displaced left posterior second, third, and fourth rib fractures. Comminuted minimally displaced fracture extends through the body of the scapula. No other acute bony abnormalities. Chronic appearing T9 and T11 compression fractures are seen, estimated 50% loss of height. Reconstructed images demonstrate no additional findings. CT ABDOMEN PELVIS FINDINGS Hepatobiliary: No focal liver abnormality is seen. Status post cholecystectomy. Expected postsurgical dilatation of the common bile duct. Pancreas: Unremarkable. No pancreatic ductal dilatation or surrounding inflammatory changes. Spleen: No splenic injury or perisplenic hematoma. Adrenals/Urinary Tract: No adrenal hemorrhage or renal injury identified. Bladder is unremarkable. Stomach/Bowel: No bowel obstruction or ileus. Normal appendix right lower quadrant. No bowel wall thickening or inflammatory change. Vascular/Lymphatic: Aortic atherosclerosis. No enlarged abdominal or pelvic lymph nodes. Reproductive: Status post hysterectomy. No adnexal masses. Other: No free fluid or free intraperitoneal gas. No abdominal wall hernia. Musculoskeletal: No acute displaced fractures. Chronic appearing compression deformities involving the superior endplates of L1, L2, and L3. Severe degenerative changes at the L4-5 and L5-S1 level. Reconstructed images demonstrate no additional findings. IMPRESSION: 1. Minimally displaced left posterior second through fourth rib fractures. 2. Comminuted and minimally displaced left scapular fracture. 3. Trace left pleural effusion. 4. No acute intra-abdominal or intrapelvic process. 5.  Aortic Atherosclerosis (ICD10-I70.0). Electronically Signed   By: Sharlet Salina M.D.   On: 08/30/2023 22:07   DG Shoulder Left  Result Date: 08/30/2023 CLINICAL DATA:  Recent fall with left shoulder pain, initial encounter EXAM: LEFT SHOULDER - 2+ VIEW COMPARISON:  12/31/2022 FINDINGS: Degenerative changes of the acromioclavicular joint are  seen. Humeral head is well seated. Irregularity along the tip of the scapula inferiorly is noted which may be related to a mildly displaced fracture. This can be better evaluated on upcoming CT examination. IMPRESSION: Irregularity along the scapular tip which may represent a focal fracture. This was not well visualized on a prior exam from 12/31/2022. This can be evaluated on upcoming CT. Electronically Signed   By: Alcide Clever M.D.   On: 08/30/2023 20:19   DG Ankle Complete Right  Result Date: 08/30/2023 CLINICAL DATA:  Ankle pain following fall, initial encounter EXAM: RIGHT ANKLE - COMPLETE 3+ VIEW COMPARISON:  None Available. FINDINGS: There is no evidence of fracture, dislocation, or joint effusion. There is no evidence of arthropathy or other focal bone abnormality. Soft tissues are unremarkable. IMPRESSION: No acute abnormality noted. Electronically Signed   By: Alcide Clever M.D.   On: 08/30/2023 20:12     Data Reviewed: Relevant notes from primary care and specialist visits, past discharge summaries as available in EHR, including Care Everywhere. Prior diagnostic testing as pertinent to current admission diagnoses Updated medications and problem lists for reconciliation ED course, including vitals, labs, imaging, treatment and response to treatment Triage notes, nursing and pharmacy notes and ED provider's notes Notable results as noted in HPI   Assessment and Plan: * Multiple left rib fractures, initial encounter Left scapular fracture Accidental fall down stairs, initial encounter Pain control, multimodal Geriatric trauma protocol (I-S, flutter valve, PT OT consults, delirium precautions, O2  as needed, guaifenesin pain control and muscle relaxant...) Consider Ortho consult for scapular fracture  Seronegative rheumatoid arthritis (HCC) Chronic pain syndrome Continue home methotrexate Continue duloxetine, gabapentin  Hypertension Continue home meds, pending med  rec  Generalized anxiety disorder Continue Cymbalta and Wellbutrin    DVT prophylaxis: Lovenox  Consults: none  Advance Care Planning:   Code Status: Prior   Family Communication: none  Disposition Plan: Back to previous home environment  Severity of Illness: The appropriate patient status for this patient is OBSERVATION. Observation status is judged to be reasonable and necessary in order to provide the required intensity of service to ensure the patient's safety. The patient's presenting symptoms, physical exam findings, and initial radiographic and laboratory data in the context of their medical condition is felt to place them at decreased risk for further clinical deterioration. Furthermore, it is anticipated that the patient will be medically stable for discharge from the hospital within 2 midnights of admission.   Author: Andris Baumann, MD 08/31/2023 12:32 AM  For on call review [email or web address].

## 2023-08-31 NOTE — ED Notes (Signed)
Got up to toilet with much pain with movement.

## 2023-08-31 NOTE — Progress Notes (Signed)
OT Cancellation Note  Patient Details Name: Suzzane Warrior MRN: [id number] DOB: 09/25/1951   Cancelled Treatment:    Reason Eval/Treat Not Completed: Pain limiting ability to participate (Discussed with PT team - pt in 10/10 pain with mobility. Will re-attempt tomorrow to see if pain has improved for OT eval. Sling in room.)  Janyth Riera L. Alzena Gerber, OTR/L  08/31/23, 2:01 PM

## 2023-08-31 NOTE — Assessment & Plan Note (Signed)
Continue Cymbalta and Wellbutrin

## 2023-08-31 NOTE — Assessment & Plan Note (Addendum)
Left scapular fracture Accidental fall down stairs, initial encounter Pain control, multimodal Geriatric trauma protocol (I-S, flutter valve, PT OT consults, delirium precautions, O2 as needed, guaifenesin pain control and muscle relaxant...) Consider Ortho consult for scapular fracture

## 2023-08-31 NOTE — ED Notes (Signed)
Pt ambulatory to restroom

## 2023-08-31 NOTE — Consult Note (Signed)
ORTHOPAEDIC CONSULTATION  REQUESTING PHYSICIAN: Lurene Shadow, MD  Chief Complaint:   Left scapula fracture  History of Present Illness: Emily Landry is a 72 y.o. female with medical history significant for Rheumatoid arthritis, anxiety, lumbar laminectomy with chronic pain on chronic opiates, HTN who presents to the ED following an accidental fall while walking down the stairs.  Patient landed on her left shoulder and back from the fall as well as twisting her right ankle slightly.  Patient reports her right ankle is feeling better and she has ambulated since coming into the hospital to and from the bathroom several times this morning with her daughter.  She does endorse severe lateral left shoulder and posterior shoulder pain with any motion of the left upper extremity.  She denies any previous injury to her left shoulder or back but does have a history of compression fractures in her thoracic spine.  The patient does endorse some pleuritic style pain when taking a deep breath likely secondary to her rib fractures.  She had no presyncopal symptoms or chest pain or loss of consciousness.  Nuys any numbness or tingling.  Past Medical History:  Diagnosis Date   Anxiety    Arthritis    Chronic back pain    Depression    Elevated blood pressure reading    GERD (gastroesophageal reflux disease)    History of hiatal hernia    PONV (postoperative nausea and vomiting)    x 1 with Hysterectomy   PTSD (post-traumatic stress disorder)    Refusal of blood transfusions as patient is Jehovah's Witness    Past Surgical History:  Procedure Laterality Date   ABDOMINAL HYSTERECTOMY     no longer has cervix   CHOLECYSTECTOMY     COLONOSCOPY     LAMINECTOMY  1971   LUMBAR DISC SURGERY  1991   LUMBAR LAMINECTOMY/DECOMPRESSION MICRODISCECTOMY N/A 05/20/2022   Procedure: L2-3 AND L4-5 DECOMPRESSION, RIGHT L3-4 AND L5-S1 MICRODISCECTOMIES;   Surgeon: Venetia Night, MD;  Location: [facility] ORS;  Service: Neurosurgery;  Laterality: N/A;   TONSILLECTOMY     Social History   Socioeconomic History   Marital status: Married    Spouse name: Clyde   Number of children: 4   Years of education: high school   Highest education level: Not on file  Occupational History   Not on file  Tobacco Use   Smoking status: Former    Types: Cigarettes   Smokeless tobacco: Never   Tobacco comments:    Smoked for 2 years as a teenager only  Advertising account planner   Vaping status: Never Used  Substance and Sexual Activity   Alcohol use: Yes    Comment: occ   Drug use: Never   Sexual activity: Not Currently  Other Topics Concern   Not on file  Social History Narrative   07/25/20   From: [place], moved to be near children   Living: with husband, [place] and oldest daughter   Work: retired       Family: Scientist, research (physical sciences) (daughter she lives), Physicist, medical ([place]), [place] ([place]), [place] ([place]) - 2 grandchildren - one in [place] and one in [place]      Enjoys: bike, fish, Pharmacist, community      Exercise: walking regularly   Diet: healthy - too many sweets      Safety   Seat belts: Yes    Guns: No   Safe in relationships: Yes    Social Determinants of Health   Financial Resource Strain: Low Risk  (01/14/2023)  Received from [facility], [facility]   Overall Financial Resource Strain (CARDIA)    Difficulty of Paying Living Expenses: Not very hard  Food Insecurity: No Food Insecurity (01/14/2023)   Received from [facility], [facility]   Hunger Vital Sign    Worried About Running Out of Food in the Last Year: Never true    Ran Out of Food in the Last Year: Never true  Transportation Needs: No Transportation Needs (01/14/2023)   Received from [facility], [facility]   [facility] - Transportation    In the past 12 months, has lack of transportation kept you from medical  appointments or from getting medications?: No    Lack of Transportation (Non-Medical): No  Physical Activity: Not on file  Stress: Not on file  Social Connections: Not on file   Family History  Problem Relation Age of Onset   Breast cancer Mother 16   Diabetes Mother    Rheum arthritis Mother    Arthritis Father    Gout Father    Alcohol abuse Father    No Known Allergies Prior to Admission medications   Medication Sig Start Date End Date Taking? Authorizing Provider  buPROPion (WELLBUTRIN XL) 150 MG 24 hr tablet TAKE THREE TABLETS BY MOUTH ONE TIME DAILY 01/29/23   [provider]  DULoxetine (CYMBALTA) 60 MG capsule Take 1 capsule (60 mg total) by mouth daily. 03/16/23 09/12/23  Edward Jolly, MD  folic acid (FOLVITE) 1 MG tablet Take 1 mg by mouth daily. 09/09/20   [provider]  gabapentin (NEURONTIN) 400 MG capsule Take 1 capsule (400 mg total) by mouth 2 (two) times daily. 03/16/23 06/14/23  Edward Jolly, MD  ibandronate (BONIVA) 150 MG tablet Take 150 mg by mouth every 30 (thirty) days. 12/15/22 12/15/23  [provider]  methotrexate (RHEUMATREX) 2.5 MG tablet Take 2.5 mg by mouth once a week. 4 tablets on Wednesdays 09/09/20   [provider]  pantoprazole (PROTONIX) 20 MG tablet Take 20 mg by mouth daily.    [provider]   CT L-SPINE NO CHARGE  Result Date: 08/30/2023 CLINICAL DATA:  Fall injury with back pain, left rib cage and left clavicle fractures and left scapular fracture. EXAM: CT THORACIC AND LUMBAR SPINE WITHOUT CONTRAST TECHNIQUE: Multidetector CT imaging of the thoracic and lumbar spine was performed without intravenous contrast. Multiplanar CT image reconstructions were also generated. RADIATION DOSE REDUCTION: This exam was performed according to the departmental dose-optimization program which includes automated exposure control, adjustment of the mA and/or kV according to patient size and/or use of iterative reconstruction  technique. COMPARISON:  MRI thoracic spine 11/02/2020, MRI lumbar spine 04/12/2022. FINDINGS: CT THORACIC SPINE FINDINGS Alignment: There is 3 mm cortical retropulsion in reference to chronic appearing compression fractures of T9 and 11. There is mild thoracic kyphosis and slight thoracic dextroscoliosis. No acute traumatic listhesis or other alignment abnormality is seen. Vertebrae: Generalized osteopenia. There are T9 and 11 bowtie shaped compression fractures both with a chronic appearance new since 2021, with the greatest height loss centrally, at T9 with anterior height loss 50% and posterior height loss 40%, at T11 with anterior height loss 40% and posterior height loss 30%. There are anterior bridging osteophytes from T9 up to T8, marginal endplate spurring of T11 and 3 mm posterior cortical retropulsion at both levels. There is no cortical fragmentation or step-off is seen at either level. The other vertebrae  are normal in heights. There is thoracic spondylosis but no primary pathologic process is seen. Paraspinal and other soft tissues: No paraspinal hematoma is seen. Disc levels: No herniated discs or cord compromise are suspected allowing for lack of intrathecal contrast. There is mild flattening of the ventral thecal sac due to retropulsion at T9 and T11 but no secondary mass effect is seen. Due to the chronic compression fractures there is moderate bilateral foraminal stenosis at T8-9 and T9-10, and moderate to severe left and moderate right foraminal stenosis at T10-11. The other thoracic spine foramina are clear. Other: There are nondisplaced posterior fractures of the left second, third and fourth ribs and slightly displaced fractures of the posteromedial sixth through ninth left ribs. CT LUMBAR SPINE FINDINGS Segmentation: Standard. Alignment: There is a mild levoscoliosis apex at L4. Otherwise negative lumbar alignment. Vertebrae: There is osteopenia. No acute fracture or primary pathologic process  is seen. There are mild-to-moderate chronic compression fractures of L1, L2 and L3. L4 and 5 are normal in heights. These fractures were first seen on the MRI 04/12/2022. There is moderate marginal osteophytosis of the lumbar spine. Spinous process abutment with opposing surface spurring and sclerosis is noted at L2-3 and L3-4, to a lesser extent L4-5. There is reactive sclerosis in the L4 and L5 vertebral bodies which is believed to be due to discogenic degenerative arthrosis but has progressed since 04/12/2022. No endplate destructive changes are seen. Paraspinal and other soft tissues: No paraspinal mass or hematoma. Aortic atherosclerosis. Disc levels: T12-L1: Slight disc space loss.  No herniation or stenosis. L1-2: Normal disc height. There is a mild posterior disc osteophyte complex but no herniation or canal stenosis. Due to facet spurring and foraminal disc bulging there is unilateral moderate to severe left foraminal stenosis. L2-3: There is mild disc space loss with vacuum disc phenomenon. Broad posterior disc osteophyte complex and dorsal ligamentous thickening cause mild-to-moderate spinal canal stenosis. Facet joint spurs and spondylosis causes moderate unilateral left foraminal stenosis. L3-4: There is moderate disc space loss with vacuum phenomenon. Posterior disc osteophyte complex and dorsal ligamentous thickening in combination causing moderate spinal canal stenosis. Facet spurring causes mild left, moderate to severe right foraminal stenosis. L4-5: There is complete degenerative disc collapse. Circumferential disc osteophyte complex. There is mild spinal canal stenosis, with effaced lateral recesses. Mild-to-moderate facet joint spurring and spondylosis cause moderate to severe right and moderate left foraminal stenosis. L5-S1: This disc is also collapsed. Posterior disc osteophyte complex causes mild-to-moderate spinal stenosis and lateralizes to the right where there is posterior displacement  and compression of the right S1 nerve root, slight compression of the left. Facet spurs and spondylosis cause bilateral moderate to severe foraminal stenosis. The visualized SI joints are patent. The visualized sacrum is intact. IMPRESSION: 1. Chronic appearing compression fractures of T9 and T11 with 3 mm posterior cortical retropulsion at both levels but no secondary findings of an acute fracture. 2. Thoracic spine degenerative changes and minimal to mild kyphodextroscoliosis. 3. Multiple left-sided rib fractures. 4. Chronic compression fractures of L1, L2 and L3. 5. Osteopenia, degenerative change and mild lumbar levoscoliosis, without evidence for acute lumbar fracture. 6. Spinous process abutment with opposing surface spurring and sclerosis at L2-3 and L3-4, to a lesser extent L4-5. 7. L5-S1 disc osteophyte complex lateralizes to the right where there is posterior displacement and compression of the right S1 nerve root, slight compression of the left S1 nerve root. 8. Aortic atherosclerosis and remaining detailed findings described above. Aortic Atherosclerosis (ICD10-I70.0).  Electronically Signed   By: Almira Bar M.D.   On: 08/30/2023 22:57   CT T-SPINE NO CHARGE  Result Date: 08/30/2023 CLINICAL DATA:  Fall injury with back pain, left rib cage and left clavicle fractures and left scapular fracture. EXAM: CT THORACIC AND LUMBAR SPINE WITHOUT CONTRAST TECHNIQUE: Multidetector CT imaging of the thoracic and lumbar spine was performed without intravenous contrast. Multiplanar CT image reconstructions were also generated. RADIATION DOSE REDUCTION: This exam was performed according to the departmental dose-optimization program which includes automated exposure control, adjustment of the mA and/or kV according to patient size and/or use of iterative reconstruction technique. COMPARISON:  MRI thoracic spine 11/02/2020, MRI lumbar spine 04/12/2022. FINDINGS: CT THORACIC SPINE FINDINGS Alignment: There is 3 mm  cortical retropulsion in reference to chronic appearing compression fractures of T9 and 11. There is mild thoracic kyphosis and slight thoracic dextroscoliosis. No acute traumatic listhesis or other alignment abnormality is seen. Vertebrae: Generalized osteopenia. There are T9 and 11 bowtie shaped compression fractures both with a chronic appearance new since 2021, with the greatest height loss centrally, at T9 with anterior height loss 50% and posterior height loss 40%, at T11 with anterior height loss 40% and posterior height loss 30%. There are anterior bridging osteophytes from T9 up to T8, marginal endplate spurring of T11 and 3 mm posterior cortical retropulsion at both levels. There is no cortical fragmentation or step-off is seen at either level. The other vertebrae are normal in heights. There is thoracic spondylosis but no primary pathologic process is seen. Paraspinal and other soft tissues: No paraspinal hematoma is seen. Disc levels: No herniated discs or cord compromise are suspected allowing for lack of intrathecal contrast. There is mild flattening of the ventral thecal sac due to retropulsion at T9 and T11 but no secondary mass effect is seen. Due to the chronic compression fractures there is moderate bilateral foraminal stenosis at T8-9 and T9-10, and moderate to severe left and moderate right foraminal stenosis at T10-11. The other thoracic spine foramina are clear. Other: There are nondisplaced posterior fractures of the left second, third and fourth ribs and slightly displaced fractures of the posteromedial sixth through ninth left ribs. CT LUMBAR SPINE FINDINGS Segmentation: Standard. Alignment: There is a mild levoscoliosis apex at L4. Otherwise negative lumbar alignment. Vertebrae: There is osteopenia. No acute fracture or primary pathologic process is seen. There are mild-to-moderate chronic compression fractures of L1, L2 and L3. L4 and 5 are normal in heights. These fractures were first  seen on the MRI 04/12/2022. There is moderate marginal osteophytosis of the lumbar spine. Spinous process abutment with opposing surface spurring and sclerosis is noted at L2-3 and L3-4, to a lesser extent L4-5. There is reactive sclerosis in the L4 and L5 vertebral bodies which is believed to be due to discogenic degenerative arthrosis but has progressed since 04/12/2022. No endplate destructive changes are seen. Paraspinal and other soft tissues: No paraspinal mass or hematoma. Aortic atherosclerosis. Disc levels: T12-L1: Slight disc space loss.  No herniation or stenosis. L1-2: Normal disc height. There is a mild posterior disc osteophyte complex but no herniation or canal stenosis. Due to facet spurring and foraminal disc bulging there is unilateral moderate to severe left foraminal stenosis. L2-3: There is mild disc space loss with vacuum disc phenomenon. Broad posterior disc osteophyte complex and dorsal ligamentous thickening cause mild-to-moderate spinal canal stenosis. Facet joint spurs and spondylosis causes moderate unilateral left foraminal stenosis. L3-4: There is moderate disc space loss with vacuum phenomenon.  Posterior disc osteophyte complex and dorsal ligamentous thickening in combination causing moderate spinal canal stenosis. Facet spurring causes mild left, moderate to severe right foraminal stenosis. L4-5: There is complete degenerative disc collapse. Circumferential disc osteophyte complex. There is mild spinal canal stenosis, with effaced lateral recesses. Mild-to-moderate facet joint spurring and spondylosis cause moderate to severe right and moderate left foraminal stenosis. L5-S1: This disc is also collapsed. Posterior disc osteophyte complex causes mild-to-moderate spinal stenosis and lateralizes to the right where there is posterior displacement and compression of the right S1 nerve root, slight compression of the left. Facet spurs and spondylosis cause bilateral moderate to severe  foraminal stenosis. The visualized SI joints are patent. The visualized sacrum is intact. IMPRESSION: 1. Chronic appearing compression fractures of T9 and T11 with 3 mm posterior cortical retropulsion at both levels but no secondary findings of an acute fracture. 2. Thoracic spine degenerative changes and minimal to mild kyphodextroscoliosis. 3. Multiple left-sided rib fractures. 4. Chronic compression fractures of L1, L2 and L3. 5. Osteopenia, degenerative change and mild lumbar levoscoliosis, without evidence for acute lumbar fracture. 6. Spinous process abutment with opposing surface spurring and sclerosis at L2-3 and L3-4, to a lesser extent L4-5. 7. L5-S1 disc osteophyte complex lateralizes to the right where there is posterior displacement and compression of the right S1 nerve root, slight compression of the left S1 nerve root. 8. Aortic atherosclerosis and remaining detailed findings described above. Aortic Atherosclerosis (ICD10-I70.0). Electronically Signed   By: Almira Bar M.D.   On: 08/30/2023 22:57   CT Head Wo Contrast  Result Date: 08/30/2023 CLINICAL DATA:  The patient fell down 2-3 stair steps, with neck pain, unknown head trauma, left chest and shoulder trauma. EXAM: CT HEAD WITHOUT CONTRAST CT CERVICAL SPINE WITHOUT CONTRAST TECHNIQUE: Multidetector CT imaging of the head and cervical spine was performed following the standard protocol without intravenous contrast. Multiplanar CT image reconstructions of the cervical spine were also generated. RADIATION DOSE REDUCTION: This exam was performed according to the departmental dose-optimization program which includes automated exposure control, adjustment of the mA and/or kV according to patient size and/or use of iterative reconstruction technique. COMPARISON:  Brain MRI 09/29/2022. No prior cervical spine images were plain films. FINDINGS: CT HEAD FINDINGS Brain: There is mild global atrophy, slight atrophic ventriculomegaly, and  mild-to-moderate small-vessel disease of the cerebral white matter. No acute cortical based infarct, hemorrhage, mass or mass effect, or midline shift are seen. There are bilateral calcifications scattered along the falx. Basal cisterns are clear. Vascular: No hyperdense vessel or unexpected calcification. Skull: Negative for fractures or focal lesions. No visible scalp hematoma. Sinuses/Orbits: There is mild membrane thickening in the maxillary and ethmoid sinuses. The sphenoid and frontal sinus, bilateral mastoid air cells and middle ears are clear. The nasal septum is midline. Negative orbits. Other: None. CT CERVICAL SPINE FINDINGS Alignment: There is a straightened cervical lordosis and mild dextroscoliosis, without evidence of listhesis. Narrowing and osteophytes are present of the anterior atlantodental joint with calcified pannus in the posterior joint space. Skull base and vertebrae: There is osteopenia. No acute fracture is evident. No primary bone lesion or focal pathologic process. Soft tissues and spinal canal: No prevertebral fluid or swelling. No visible canal hematoma. There is a 1.3 cm heterogeneous nodule in the right lobe of the thyroid gland. This does not meet size criteria for imaging follow-up (Reference: J Am Coll Radiol. 2015 Feb;12(2): 143-50). No laryngeal mass is seen. Disc levels: There is near complete degenerative disc space  loss C5-6 and C6-7, with small bidirectional osteophytes. Posterior disc osteophyte complex C5-6 flattens the ventral cord surface narrowing the thecal sac to 6.8 mm AP. Posterior disc osteophyte complex at C6-7 mildly deforms the ventral cord surface. Other levels show preservation of normal disc heights. There are posterior disc bulges without significant mass effect at C2-3 and C3-4. Multiple levels demonstrate mild facet joint and uncinate spurring. Foraminal stenosis is noted moderately on the right at C5-6, and mild bilaterally at C6-7 and is not seen at  other cervical levels. Upper chest: There are nondisplaced fractures of left posterior second and third ribs, and a partially visible nondisplaced fracture of the proximal shaft of the left clavicle. No apical pneumothorax. Other: None. IMPRESSION: 1. No acute intracranial CT findings or depressed skull fractures. 2. Atrophy and small-vessel disease. 3. Sinus membrane disease. 4. Osteopenia and degenerative change of the cervical spine without evidence of fractures or listhesis. Straightened lordosis with mild dextroscoliosis. 5. Nondisplaced fractures of the left posterior second and third ribs, and partially visible nondisplaced fracture of the proximal shaft of the left clavicle. No apical pneumothorax. 6. 1.3 cm heterogeneous nodule in the right lobe of the thyroid gland. This does not meet size criteria for imaging follow-up. Electronically Signed   By: Almira Bar M.D.   On: 08/30/2023 22:20   CT Cervical Spine Wo Contrast  Result Date: 08/30/2023 CLINICAL DATA:  The patient fell down 2-3 stair steps, with neck pain, unknown head trauma, left chest and shoulder trauma. EXAM: CT HEAD WITHOUT CONTRAST CT CERVICAL SPINE WITHOUT CONTRAST TECHNIQUE: Multidetector CT imaging of the head and cervical spine was performed following the standard protocol without intravenous contrast. Multiplanar CT image reconstructions of the cervical spine were also generated. RADIATION DOSE REDUCTION: This exam was performed according to the departmental dose-optimization program which includes automated exposure control, adjustment of the mA and/or kV according to patient size and/or use of iterative reconstruction technique. COMPARISON:  Brain MRI 09/29/2022. No prior cervical spine images were plain films. FINDINGS: CT HEAD FINDINGS Brain: There is mild global atrophy, slight atrophic ventriculomegaly, and mild-to-moderate small-vessel disease of the cerebral white matter. No acute cortical based infarct, hemorrhage, mass  or mass effect, or midline shift are seen. There are bilateral calcifications scattered along the falx. Basal cisterns are clear. Vascular: No hyperdense vessel or unexpected calcification. Skull: Negative for fractures or focal lesions. No visible scalp hematoma. Sinuses/Orbits: There is mild membrane thickening in the maxillary and ethmoid sinuses. The sphenoid and frontal sinus, bilateral mastoid air cells and middle ears are clear. The nasal septum is midline. Negative orbits. Other: None. CT CERVICAL SPINE FINDINGS Alignment: There is a straightened cervical lordosis and mild dextroscoliosis, without evidence of listhesis. Narrowing and osteophytes are present of the anterior atlantodental joint with calcified pannus in the posterior joint space. Skull base and vertebrae: There is osteopenia. No acute fracture is evident. No primary bone lesion or focal pathologic process. Soft tissues and spinal canal: No prevertebral fluid or swelling. No visible canal hematoma. There is a 1.3 cm heterogeneous nodule in the right lobe of the thyroid gland. This does not meet size criteria for imaging follow-up (Reference: J Am Coll Radiol. 2015 Feb;12(2): 143-50). No laryngeal mass is seen. Disc levels: There is near complete degenerative disc space loss C5-6 and C6-7, with small bidirectional osteophytes. Posterior disc osteophyte complex C5-6 flattens the ventral cord surface narrowing the thecal sac to 6.8 mm AP. Posterior disc osteophyte complex at C6-7 mildly deforms the  ventral cord surface. Other levels show preservation of normal disc heights. There are posterior disc bulges without significant mass effect at C2-3 and C3-4. Multiple levels demonstrate mild facet joint and uncinate spurring. Foraminal stenosis is noted moderately on the right at C5-6, and mild bilaterally at C6-7 and is not seen at other cervical levels. Upper chest: There are nondisplaced fractures of left posterior second and third ribs, and a  partially visible nondisplaced fracture of the proximal shaft of the left clavicle. No apical pneumothorax. Other: None. IMPRESSION: 1. No acute intracranial CT findings or depressed skull fractures. 2. Atrophy and small-vessel disease. 3. Sinus membrane disease. 4. Osteopenia and degenerative change of the cervical spine without evidence of fractures or listhesis. Straightened lordosis with mild dextroscoliosis. 5. Nondisplaced fractures of the left posterior second and third ribs, and partially visible nondisplaced fracture of the proximal shaft of the left clavicle. No apical pneumothorax. 6. 1.3 cm heterogeneous nodule in the right lobe of the thyroid gland. This does not meet size criteria for imaging follow-up. Electronically Signed   By: Almira Bar M.D.   On: 08/30/2023 22:20   CT CHEST ABDOMEN PELVIS W CONTRAST  Result Date: 08/30/2023 CLINICAL DATA:  Larey Seat down 2-3 steps, left rib and shoulder pain EXAM: CT CHEST, ABDOMEN, AND PELVIS WITH CONTRAST TECHNIQUE: Multidetector CT imaging of the chest, abdomen and pelvis was performed following the standard protocol during bolus administration of intravenous contrast. RADIATION DOSE REDUCTION: This exam was performed according to the departmental dose-optimization program which includes automated exposure control, adjustment of the mA and/or kV according to patient size and/or use of iterative reconstruction technique. CONTRAST:  OMNIPAQUE IOHEXOL 300 MG/ML  SOLN COMPARISON:  12/31/2022 FINDINGS: CT CHEST FINDINGS Cardiovascular: The heart is unremarkable without pericardial effusion. No evidence of thoracic aortic aneurysm or dissection. No evidence of vascular injury. Mediastinum/Nodes: No enlarged mediastinal, hilar, or axillary lymph nodes. Thyroid gland, trachea, and esophagus demonstrate no significant findings. Lungs/Pleura: Trace left pleural fluid. No airspace disease or pneumothorax. Central airways are patent. Musculoskeletal: There are  minimally displaced left posterior second, third, and fourth rib fractures. Comminuted minimally displaced fracture extends through the body of the scapula. No other acute bony abnormalities. Chronic appearing T9 and T11 compression fractures are seen, estimated 50% loss of height. Reconstructed images demonstrate no additional findings. CT ABDOMEN PELVIS FINDINGS Hepatobiliary: No focal liver abnormality is seen. Status post cholecystectomy. Expected postsurgical dilatation of the common bile duct. Pancreas: Unremarkable. No pancreatic ductal dilatation or surrounding inflammatory changes. Spleen: No splenic injury or perisplenic hematoma. Adrenals/Urinary Tract: No adrenal hemorrhage or renal injury identified. Bladder is unremarkable. Stomach/Bowel: No bowel obstruction or ileus. Normal appendix right lower quadrant. No bowel wall thickening or inflammatory change. Vascular/Lymphatic: Aortic atherosclerosis. No enlarged abdominal or pelvic lymph nodes. Reproductive: Status post hysterectomy. No adnexal masses. Other: No free fluid or free intraperitoneal gas. No abdominal wall hernia. Musculoskeletal: No acute displaced fractures. Chronic appearing compression deformities involving the superior endplates of L1, L2, and L3. Severe degenerative changes at the L4-5 and L5-S1 level. Reconstructed images demonstrate no additional findings. IMPRESSION: 1. Minimally displaced left posterior second through fourth rib fractures. 2. Comminuted and minimally displaced left scapular fracture. 3. Trace left pleural effusion. 4. No acute intra-abdominal or intrapelvic process. 5.  Aortic Atherosclerosis (ICD10-I70.0). Electronically Signed   By: Sharlet Salina M.D.   On: 08/30/2023 22:07   DG Shoulder Left  Result Date: 08/30/2023 CLINICAL DATA:  Recent fall with left shoulder pain, initial encounter  EXAM: LEFT SHOULDER - 2+ VIEW COMPARISON:  12/31/2022 FINDINGS: Degenerative changes of the acromioclavicular joint are  seen. Humeral head is well seated. Irregularity along the tip of the scapula inferiorly is noted which may be related to a mildly displaced fracture. This can be better evaluated on upcoming CT examination. IMPRESSION: Irregularity along the scapular tip which may represent a focal fracture. This was not well visualized on a prior exam from 12/31/2022. This can be evaluated on upcoming CT. Electronically Signed   By: Alcide Clever M.D.   On: 08/30/2023 20:19   DG Ankle Complete Right  Result Date: 08/30/2023 CLINICAL DATA:  Ankle pain following fall, initial encounter EXAM: RIGHT ANKLE - COMPLETE 3+ VIEW COMPARISON:  None Available. FINDINGS: There is no evidence of fracture, dislocation, or joint effusion. There is no evidence of arthropathy or other focal bone abnormality. Soft tissues are unremarkable. IMPRESSION: No acute abnormality noted. Electronically Signed   By: Alcide Clever M.D.   On: 08/30/2023 20:12    Positive ROS: All other systems have been reviewed and were otherwise negative with the exception of those mentioned in the HPI and as above.  Physical Exam: General:  Alert, no acute distress Psychiatric:  Patient is competent for consent with normal mood and affect   Cardiovascular:  No pedal edema Respiratory:  No wheezing, non-labored breathing GI:  Abdomen is soft and non-tender Skin:  No lesions in the area of chief complaint Neurologic:  Sensation intact distally Lymphatic:  No axillary or cervical lymphadenopathy  Orthopedic Exam:  Left upper extremity Tender to palpation over the lateral proximal humerus and posterior scapula with some swelling over the area No tenderness over the distal humerus elbow forearm or hand No tenderness over the clavicle Intact range of motion to the wrist fingers with intact neurovascular exam AIN/PIN/U/M/R/Ax distributions intact Compartments all soft with a good radial pulse  Secondary survey No tenderness to palpation over other bony  prominences in the lower extremities or bilateral upper extremities No pain with logroll or simulated axial loading of the bilateral lower extremity All compartments soft No tenderness to palpation over the cervical or thoracic spine, no bony step-off Motor grossly intact throughout, no focal deficits Sensation grossly intact throughout, no focal deficits Good distal pulses and capillary refill on all extremities   X-rays:  X-rays and CT scans reviewed of the left shoulder and scapula which show a mid body to distal tip scapula fracture with some comminution and minimal displacement.  No evidence of any involvement of the glenoid or glenoid neck and the humeral head is located.  Agree with radiology interpretation  Assessment: Left scapula fracture  Plan: I reviewed the radiologic and clinical exam findings with the patient and her family.  She has a comminuted scapula body fracture with no involvement of the glenoid neck or glenoid.  We discussed treatment options including potential operative and conservative treatment options and at this time I am recommending nonoperative intervention with a sling and nonweightbearing of the left upper extremity.  I discussed the natural course and history of fracture healing with the patient and family and reviewed the pertinent relevant findings.  She will follow-up in the office in 1 to 2 weeks after discharge for repeat x-rays to ensure no more further displacement of the fracture.  All questions answered patient family agree with the plan with outpatient follow-up.  Follow-up information was provided to the patient's family.    Reinaldo Berber MD  Beeper #:  [phone]  08/31/2023 1:25 PM

## 2023-08-31 NOTE — ED Notes (Signed)
Pt titrated to RA per Dr Myriam Forehand

## 2023-08-31 NOTE — ED Notes (Signed)
Pt given 2 new ice packs as requested

## 2023-08-31 NOTE — Progress Notes (Signed)
PHARMACIST - PHYSICIAN COMMUNICATION  CONCERNING:  Enoxaparin (Lovenox) for DVT Prophylaxis    RECOMMENDATION: Patient was prescribed enoxaprin 40mg  q24 hours for VTE prophylaxis.   Filed Weights   08/30/23 1751  Weight: 95.3 kg (210 lb)    Body mass index is 31.93 kg/m.  Estimated Creatinine Clearance: 76.8 mL/min (by C-G formula based on SCr of 0.52 mg/dL).   Based on [facility] policy patient is candidate for enoxaparin 0.5mg /kg TBW SQ every 24 hours based on BMI being >30.  DESCRIPTION: Pharmacy has adjusted enoxaparin dose per [facility] policy.  Patient is now receiving enoxaparin 0.5 mg/kg every 24 hours   Otelia Sergeant, PharmD, [facility] 08/31/2023 12:55 AM

## 2023-08-31 NOTE — Progress Notes (Addendum)
Emily Landry is a 72 y.o. female with medical history significant for Rheumatoid arthritis, anxiety, lumbar laminectomy with chronic pain on chronic opiates, HTN who presents to the ED following an accidental fall while walking down the stairs.  She was found to have multiple left rib fractures and left scapular fracture. Dr. Audelia Acton, orthopedic surgeon, has been consulted for left scapular fracture. Analgesics as needed for pain.  PT and OT evaluation. Plan of care discussed with patient and Emily Landry, daughter, at the bedside.   ADDENDUM  I went back to see the patient and her 2 daughters at the bedside.  Patient had requested to go home today.  She had been evaluated by Dr. Audelia Acton, who recommended conservative management for left scapular fracture.  Patient has a sling in the left arm.  She had been successfully weaned off of oxygen.  Family and OT were at the bedside.  After talking to her, she realized that she was in so much pain and was worried that she would not be able to adequately control her pain at home.  She has decided to stay in the hospital to get her pain under control prior to discharge.  2 daughters at the bedside agree with the plan.

## 2023-08-31 NOTE — ED Notes (Signed)
Pt daughter requesting to speak to MD regarding d/c home. Reports it is not helpful to sit in the ER for pain control. Dr Myriam Forehand messaged.

## 2023-09-01 DIAGNOSIS — S2242XA Multiple fractures of ribs, left side, initial encounter for closed fracture: Secondary | ICD-10-CM | POA: Diagnosis not present

## 2023-09-01 MED ORDER — METHOCARBAMOL 500 MG PO TABS
750.0000 mg | ORAL_TABLET | Freq: Four times a day (QID) | ORAL | Status: DC
  Administered 2023-09-01 – 2023-09-03 (×8): 750 mg via ORAL
  Filled 2023-09-01 (×8): qty 2

## 2023-09-01 MED ORDER — ALUM & MAG HYDROXIDE-SIMETH 200-200-20 MG/5ML PO SUSP
30.0000 mL | ORAL | Status: DC | PRN
  Administered 2023-09-01: 30 mL via ORAL
  Filled 2023-09-01: qty 30

## 2023-09-01 MED ORDER — LIDOCAINE 5 % EX PTCH
1.0000 | MEDICATED_PATCH | CUTANEOUS | Status: DC
  Administered 2023-09-01 – 2023-09-02 (×2): 1 via TRANSDERMAL
  Filled 2023-09-01 (×2): qty 1

## 2023-09-01 MED ORDER — OXYCODONE HCL 5 MG PO TABS
5.0000 mg | ORAL_TABLET | ORAL | Status: DC | PRN
  Administered 2023-09-01 – 2023-09-02 (×6): 10 mg via ORAL
  Administered 2023-09-03: 5 mg via ORAL
  Filled 2023-09-01 (×6): qty 2
  Filled 2023-09-01: qty 1

## 2023-09-01 MED ORDER — HYDROMORPHONE HCL 1 MG/ML IJ SOLN
0.5000 mg | INTRAMUSCULAR | Status: DC | PRN
  Administered 2023-09-01: 0.5 mg via INTRAVENOUS
  Filled 2023-09-01: qty 0.5

## 2023-09-01 MED ORDER — OXYCODONE HCL 5 MG PO TABS: 5.0000 mg | ORAL_TABLET | ORAL | Status: DC | PRN

## 2023-09-01 MED ORDER — HYDROMORPHONE HCL 1 MG/ML IJ SOLN
0.5000 mg | INTRAMUSCULAR | Status: DC | PRN
  Administered 2023-09-02: 0.5 mg via INTRAVENOUS
  Filled 2023-09-01: qty 0.5

## 2023-09-01 MED ORDER — GABAPENTIN 400 MG PO CAPS
400.0000 mg | ORAL_CAPSULE | Freq: Three times a day (TID) | ORAL | Status: DC
  Administered 2023-09-01 – 2023-09-03 (×6): 400 mg via ORAL
  Filled 2023-09-01 (×6): qty 1

## 2023-09-01 MED ORDER — KETOROLAC TROMETHAMINE 15 MG/ML IJ SOLN
15.0000 mg | Freq: Four times a day (QID) | INTRAMUSCULAR | Status: DC
  Administered 2023-09-01 – 2023-09-03 (×8): 15 mg via INTRAVENOUS
  Filled 2023-09-01 (×8): qty 1

## 2023-09-01 NOTE — Progress Notes (Signed)
PROGRESS NOTE    Blayke Dam  ZOX:[id number] DOB: May 24, 1951 DOA: 08/30/2023 PCP: Kandyce Rud, MD    Brief Narrative:   72 y.o. female with medical history significant for Rheumatoid arthritis, anxiety, lumbar laminectomy with chronic pain on chronic opiates, HTN who presents to the ED following an accidental fall while walking down the stairs.  States the drywall with a handrail at the side of the staircase was removed for elevation and she fell sideways of the staircase from the height of about the third or fourth step from the bottom of the staircase.  Had no loss of consciousness.  She presented with left shoulder pain left rib pain and back pain as well as right ankle pain and neck pain.  She was previously in her usual state of health   9/18: Patient seen in consultation by orthopedics.  Conservative management with sling and immobility recommended.  Patient still in a substantial amount of pain requiring intravenous narcotic use.   Assessment & Plan:   Principal Problem:   Multiple left rib fractures, initial encounter Active Problems:   Fall (on) (from) unspecified stairs and steps, initial encounter   Seronegative rheumatoid arthritis (HCC)   Chronic pain syndrome   Chronic, continuous use of opioids   Hypertension   Generalized anxiety disorder   Closed left scapular fracture  * Multiple left rib fractures, initial encounter Left scapular fracture Accidental fall down stairs, initial encounter Lengthy discussion with patient and daughter at bedside on 9/18.  Will attempt multimodal pain control.  Minimize IV narcotic use.  No plans for surgical intervention.  Family would like to bring patient back home but current recommendation given level of debility is for skilled nursing facility.  Will reevaluate after 24 hours of augmented pain regimen.    Seronegative rheumatoid arthritis (HCC) Chronic pain syndrome Continue home methotrexate Continue duloxetine,  gabapentin   Hypertension No antihypertensives listed on home medication regimen   Generalized anxiety disorder Continue Cymbalta and Wellbutrin   DVT prophylaxis: SQ Lovenox Code Status: Full Family Communication: Daughter at bedside 9/18 Disposition Plan: Status is: Inpatient Remains inpatient appropriate because: Intractable pain   Level of care: Telemetry Medical  Consultants:  Orthopedics  Procedures:  None  Antimicrobials: None   Subjective: Seen and examined.  Sitting up in chair.  No visible distress.  States pain is only present with any exertion  Objective: Vitals:   08/31/23 2345 09/01/23 0754 09/01/23 0811 09/01/23 0835  BP: 117/70 (!) 112/59    Pulse: 94 (!) 103  94  Resp: 18 20    Temp: 98.3 F (36.8 C) 98.8 F (37.1 C)    TempSrc:      SpO2: (!) 83% (!) 81% 91% 92%  Weight:      Height:        Intake/Output Summary (Last 24 hours) at 09/01/2023 1350 Last data filed at 09/01/2023 1032 Gross per 24 hour  Intake 720 ml  Output --  Net 720 ml   Filed Weights   08/30/23 1751  Weight: 95.3 kg    Examination:  General exam: Appears calm and comfortable  Respiratory system: Clear to auscultation. Respiratory effort normal. Cardiovascular system: S1-S2, RRR, no murmurs, no pedal edema Gastrointestinal system: Soft,/ND, normal bowel sounds Central nervous system: Alert and oriented. No focal neurological deficits. Extremities: Shoulder in sling.  Limited range of motion left upper extremity Skin: No rashes, lesions or ulcers Psychiatry: Judgement and insight appear normal. Mood & affect appropriate.     Data  Reviewed: I have personally reviewed following labs and imaging studies  CBC: Recent Labs  Lab 08/30/23 1847 08/31/23 0145  WBC 12.0* 9.4  NEUTROABS 9.4*  --   HGB 14.7 14.3  HCT 44.0 43.3  MCV 90.3 91.7  PLT 241 216   Basic Metabolic Panel: Recent Labs  Lab 08/30/23 1847 08/31/23 0145  NA 138  --   K 3.6  --   CL 101   --   CO2 23  --   GLUCOSE 98  --   BUN 16  --   CREATININE 0.52 0.57  CALCIUM 9.2  --    GFR: Estimated Creatinine Clearance: 76.8 mL/min (by C-G formula based on SCr of 0.57 mg/dL). Liver Function Tests: Recent Labs  Lab 08/30/23 1847  AST 39  ALT 26  ALKPHOS 97  BILITOT 1.0  PROT 7.3  ALBUMIN 4.2   No results for input(s): "LIPASE", "AMYLASE" in the last 168 hours. No results for input(s): "AMMONIA" in the last 168 hours. Coagulation Profile: No results for input(s): "INR", "PROTIME" in the last 168 hours. Cardiac Enzymes: No results for input(s): "CKTOTAL", "CKMB", "CKMBINDEX", "TROPONINI" in the last 168 hours. BNP (last 3 results) No results for input(s): "PROBNP" in the last 8760 hours. HbA1C: No results for input(s): "HGBA1C" in the last 72 hours. CBG: No results for input(s): "GLUCAP" in the last 168 hours. Lipid Profile: No results for input(s): "CHOL", "HDL", "LDLCALC", "TRIG", "CHOLHDL", "LDLDIRECT" in the last 72 hours. Thyroid Function Tests: No results for input(s): "TSH", "T4TOTAL", "FREET4", "T3FREE", "THYROIDAB" in the last 72 hours. Anemia Panel: No results for input(s): "VITAMINB12", "FOLATE", "FERRITIN", "TIBC", "IRON", "RETICCTPCT" in the last 72 hours. Sepsis Labs: No results for input(s): "PROCALCITON", "LATICACIDVEN" in the last 168 hours.  No results found for this or any previous visit (from the past 240 hour(s)).       Radiology Studies: CT L-SPINE NO CHARGE  Result Date: 08/30/2023 CLINICAL DATA:  Fall injury with back pain, left rib cage and left clavicle fractures and left scapular fracture. EXAM: CT THORACIC AND LUMBAR SPINE WITHOUT CONTRAST TECHNIQUE: Multidetector CT imaging of the thoracic and lumbar spine was performed without intravenous contrast. Multiplanar CT image reconstructions were also generated. RADIATION DOSE REDUCTION: This exam was performed according to the departmental dose-optimization program which includes  automated exposure control, adjustment of the mA and/or kV according to patient size and/or use of iterative reconstruction technique. COMPARISON:  MRI thoracic spine 11/02/2020, MRI lumbar spine 04/12/2022. FINDINGS: CT THORACIC SPINE FINDINGS Alignment: There is 3 mm cortical retropulsion in reference to chronic appearing compression fractures of T9 and 11. There is mild thoracic kyphosis and slight thoracic dextroscoliosis. No acute traumatic listhesis or other alignment abnormality is seen. Vertebrae: Generalized osteopenia. There are T9 and 11 bowtie shaped compression fractures both with a chronic appearance new since 2021, with the greatest height loss centrally, at T9 with anterior height loss 50% and posterior height loss 40%, at T11 with anterior height loss 40% and posterior height loss 30%. There are anterior bridging osteophytes from T9 up to T8, marginal endplate spurring of T11 and 3 mm posterior cortical retropulsion at both levels. There is no cortical fragmentation or step-off is seen at either level. The other vertebrae are normal in heights. There is thoracic spondylosis but no primary pathologic process is seen. Paraspinal and other soft tissues: No paraspinal hematoma is seen. Disc levels: No herniated discs or cord compromise are suspected allowing for lack of intrathecal contrast. There is  mild flattening of the ventral thecal sac due to retropulsion at T9 and T11 but no secondary mass effect is seen. Due to the chronic compression fractures there is moderate bilateral foraminal stenosis at T8-9 and T9-10, and moderate to severe left and moderate right foraminal stenosis at T10-11. The other thoracic spine foramina are clear. Other: There are nondisplaced posterior fractures of the left second, third and fourth ribs and slightly displaced fractures of the posteromedial sixth through ninth left ribs. CT LUMBAR SPINE FINDINGS Segmentation: Standard. Alignment: There is a mild levoscoliosis apex  at L4. Otherwise negative lumbar alignment. Vertebrae: There is osteopenia. No acute fracture or primary pathologic process is seen. There are mild-to-moderate chronic compression fractures of L1, L2 and L3. L4 and 5 are normal in heights. These fractures were first seen on the MRI 04/12/2022. There is moderate marginal osteophytosis of the lumbar spine. Spinous process abutment with opposing surface spurring and sclerosis is noted at L2-3 and L3-4, to a lesser extent L4-5. There is reactive sclerosis in the L4 and L5 vertebral bodies which is believed to be due to discogenic degenerative arthrosis but has progressed since 04/12/2022. No endplate destructive changes are seen. Paraspinal and other soft tissues: No paraspinal mass or hematoma. Aortic atherosclerosis. Disc levels: T12-L1: Slight disc space loss.  No herniation or stenosis. L1-2: Normal disc height. There is a mild posterior disc osteophyte complex but no herniation or canal stenosis. Due to facet spurring and foraminal disc bulging there is unilateral moderate to severe left foraminal stenosis. L2-3: There is mild disc space loss with vacuum disc phenomenon. Broad posterior disc osteophyte complex and dorsal ligamentous thickening cause mild-to-moderate spinal canal stenosis. Facet joint spurs and spondylosis causes moderate unilateral left foraminal stenosis. L3-4: There is moderate disc space loss with vacuum phenomenon. Posterior disc osteophyte complex and dorsal ligamentous thickening in combination causing moderate spinal canal stenosis. Facet spurring causes mild left, moderate to severe right foraminal stenosis. L4-5: There is complete degenerative disc collapse. Circumferential disc osteophyte complex. There is mild spinal canal stenosis, with effaced lateral recesses. Mild-to-moderate facet joint spurring and spondylosis cause moderate to severe right and moderate left foraminal stenosis. L5-S1: This disc is also collapsed. Posterior disc  osteophyte complex causes mild-to-moderate spinal stenosis and lateralizes to the right where there is posterior displacement and compression of the right S1 nerve root, slight compression of the left. Facet spurs and spondylosis cause bilateral moderate to severe foraminal stenosis. The visualized SI joints are patent. The visualized sacrum is intact. IMPRESSION: 1. Chronic appearing compression fractures of T9 and T11 with 3 mm posterior cortical retropulsion at both levels but no secondary findings of an acute fracture. 2. Thoracic spine degenerative changes and minimal to mild kyphodextroscoliosis. 3. Multiple left-sided rib fractures. 4. Chronic compression fractures of L1, L2 and L3. 5. Osteopenia, degenerative change and mild lumbar levoscoliosis, without evidence for acute lumbar fracture. 6. Spinous process abutment with opposing surface spurring and sclerosis at L2-3 and L3-4, to a lesser extent L4-5. 7. L5-S1 disc osteophyte complex lateralizes to the right where there is posterior displacement and compression of the right S1 nerve root, slight compression of the left S1 nerve root. 8. Aortic atherosclerosis and remaining detailed findings described above. Aortic Atherosclerosis (ICD10-I70.0). Electronically Signed   By: Almira Bar M.D.   On: 08/30/2023 22:57   CT T-SPINE NO CHARGE  Result Date: 08/30/2023 CLINICAL DATA:  Fall injury with back pain, left rib cage and left clavicle fractures and left scapular fracture.  EXAM: CT THORACIC AND LUMBAR SPINE WITHOUT CONTRAST TECHNIQUE: Multidetector CT imaging of the thoracic and lumbar spine was performed without intravenous contrast. Multiplanar CT image reconstructions were also generated. RADIATION DOSE REDUCTION: This exam was performed according to the departmental dose-optimization program which includes automated exposure control, adjustment of the mA and/or kV according to patient size and/or use of iterative reconstruction technique.  COMPARISON:  MRI thoracic spine 11/02/2020, MRI lumbar spine 04/12/2022. FINDINGS: CT THORACIC SPINE FINDINGS Alignment: There is 3 mm cortical retropulsion in reference to chronic appearing compression fractures of T9 and 11. There is mild thoracic kyphosis and slight thoracic dextroscoliosis. No acute traumatic listhesis or other alignment abnormality is seen. Vertebrae: Generalized osteopenia. There are T9 and 11 bowtie shaped compression fractures both with a chronic appearance new since 2021, with the greatest height loss centrally, at T9 with anterior height loss 50% and posterior height loss 40%, at T11 with anterior height loss 40% and posterior height loss 30%. There are anterior bridging osteophytes from T9 up to T8, marginal endplate spurring of T11 and 3 mm posterior cortical retropulsion at both levels. There is no cortical fragmentation or step-off is seen at either level. The other vertebrae are normal in heights. There is thoracic spondylosis but no primary pathologic process is seen. Paraspinal and other soft tissues: No paraspinal hematoma is seen. Disc levels: No herniated discs or cord compromise are suspected allowing for lack of intrathecal contrast. There is mild flattening of the ventral thecal sac due to retropulsion at T9 and T11 but no secondary mass effect is seen. Due to the chronic compression fractures there is moderate bilateral foraminal stenosis at T8-9 and T9-10, and moderate to severe left and moderate right foraminal stenosis at T10-11. The other thoracic spine foramina are clear. Other: There are nondisplaced posterior fractures of the left second, third and fourth ribs and slightly displaced fractures of the posteromedial sixth through ninth left ribs. CT LUMBAR SPINE FINDINGS Segmentation: Standard. Alignment: There is a mild levoscoliosis apex at L4. Otherwise negative lumbar alignment. Vertebrae: There is osteopenia. No acute fracture or primary pathologic process is seen.  There are mild-to-moderate chronic compression fractures of L1, L2 and L3. L4 and 5 are normal in heights. These fractures were first seen on the MRI 04/12/2022. There is moderate marginal osteophytosis of the lumbar spine. Spinous process abutment with opposing surface spurring and sclerosis is noted at L2-3 and L3-4, to a lesser extent L4-5. There is reactive sclerosis in the L4 and L5 vertebral bodies which is believed to be due to discogenic degenerative arthrosis but has progressed since 04/12/2022. No endplate destructive changes are seen. Paraspinal and other soft tissues: No paraspinal mass or hematoma. Aortic atherosclerosis. Disc levels: T12-L1: Slight disc space loss.  No herniation or stenosis. L1-2: Normal disc height. There is a mild posterior disc osteophyte complex but no herniation or canal stenosis. Due to facet spurring and foraminal disc bulging there is unilateral moderate to severe left foraminal stenosis. L2-3: There is mild disc space loss with vacuum disc phenomenon. Broad posterior disc osteophyte complex and dorsal ligamentous thickening cause mild-to-moderate spinal canal stenosis. Facet joint spurs and spondylosis causes moderate unilateral left foraminal stenosis. L3-4: There is moderate disc space loss with vacuum phenomenon. Posterior disc osteophyte complex and dorsal ligamentous thickening in combination causing moderate spinal canal stenosis. Facet spurring causes mild left, moderate to severe right foraminal stenosis. L4-5: There is complete degenerative disc collapse. Circumferential disc osteophyte complex. There is mild spinal canal stenosis,  with effaced lateral recesses. Mild-to-moderate facet joint spurring and spondylosis cause moderate to severe right and moderate left foraminal stenosis. L5-S1: This disc is also collapsed. Posterior disc osteophyte complex causes mild-to-moderate spinal stenosis and lateralizes to the right where there is posterior displacement and  compression of the right S1 nerve root, slight compression of the left. Facet spurs and spondylosis cause bilateral moderate to severe foraminal stenosis. The visualized SI joints are patent. The visualized sacrum is intact. IMPRESSION: 1. Chronic appearing compression fractures of T9 and T11 with 3 mm posterior cortical retropulsion at both levels but no secondary findings of an acute fracture. 2. Thoracic spine degenerative changes and minimal to mild kyphodextroscoliosis. 3. Multiple left-sided rib fractures. 4. Chronic compression fractures of L1, L2 and L3. 5. Osteopenia, degenerative change and mild lumbar levoscoliosis, without evidence for acute lumbar fracture. 6. Spinous process abutment with opposing surface spurring and sclerosis at L2-3 and L3-4, to a lesser extent L4-5. 7. L5-S1 disc osteophyte complex lateralizes to the right where there is posterior displacement and compression of the right S1 nerve root, slight compression of the left S1 nerve root. 8. Aortic atherosclerosis and remaining detailed findings described above. Aortic Atherosclerosis (ICD10-I70.0). Electronically Signed   By: Almira Bar M.D.   On: 08/30/2023 22:57   CT Head Wo Contrast  Result Date: 08/30/2023 CLINICAL DATA:  The patient fell down 2-3 stair steps, with neck pain, unknown head trauma, left chest and shoulder trauma. EXAM: CT HEAD WITHOUT CONTRAST CT CERVICAL SPINE WITHOUT CONTRAST TECHNIQUE: Multidetector CT imaging of the head and cervical spine was performed following the standard protocol without intravenous contrast. Multiplanar CT image reconstructions of the cervical spine were also generated. RADIATION DOSE REDUCTION: This exam was performed according to the departmental dose-optimization program which includes automated exposure control, adjustment of the mA and/or kV according to patient size and/or use of iterative reconstruction technique. COMPARISON:  Brain MRI 09/29/2022. No prior cervical spine  images were plain films. FINDINGS: CT HEAD FINDINGS Brain: There is mild global atrophy, slight atrophic ventriculomegaly, and mild-to-moderate small-vessel disease of the cerebral white matter. No acute cortical based infarct, hemorrhage, mass or mass effect, or midline shift are seen. There are bilateral calcifications scattered along the falx. Basal cisterns are clear. Vascular: No hyperdense vessel or unexpected calcification. Skull: Negative for fractures or focal lesions. No visible scalp hematoma. Sinuses/Orbits: There is mild membrane thickening in the maxillary and ethmoid sinuses. The sphenoid and frontal sinus, bilateral mastoid air cells and middle ears are clear. The nasal septum is midline. Negative orbits. Other: None. CT CERVICAL SPINE FINDINGS Alignment: There is a straightened cervical lordosis and mild dextroscoliosis, without evidence of listhesis. Narrowing and osteophytes are present of the anterior atlantodental joint with calcified pannus in the posterior joint space. Skull base and vertebrae: There is osteopenia. No acute fracture is evident. No primary bone lesion or focal pathologic process. Soft tissues and spinal canal: No prevertebral fluid or swelling. No visible canal hematoma. There is a 1.3 cm heterogeneous nodule in the right lobe of the thyroid gland. This does not meet size criteria for imaging follow-up (Reference: J Am Coll Radiol. 2015 Feb;12(2): 143-50). No laryngeal mass is seen. Disc levels: There is near complete degenerative disc space loss C5-6 and C6-7, with small bidirectional osteophytes. Posterior disc osteophyte complex C5-6 flattens the ventral cord surface narrowing the thecal sac to 6.8 mm AP. Posterior disc osteophyte complex at C6-7 mildly deforms the ventral cord surface. Other levels show preservation of  normal disc heights. There are posterior disc bulges without significant mass effect at C2-3 and C3-4. Multiple levels demonstrate mild facet joint and  uncinate spurring. Foraminal stenosis is noted moderately on the right at C5-6, and mild bilaterally at C6-7 and is not seen at other cervical levels. Upper chest: There are nondisplaced fractures of left posterior second and third ribs, and a partially visible nondisplaced fracture of the proximal shaft of the left clavicle. No apical pneumothorax. Other: None. IMPRESSION: 1. No acute intracranial CT findings or depressed skull fractures. 2. Atrophy and small-vessel disease. 3. Sinus membrane disease. 4. Osteopenia and degenerative change of the cervical spine without evidence of fractures or listhesis. Straightened lordosis with mild dextroscoliosis. 5. Nondisplaced fractures of the left posterior second and third ribs, and partially visible nondisplaced fracture of the proximal shaft of the left clavicle. No apical pneumothorax. 6. 1.3 cm heterogeneous nodule in the right lobe of the thyroid gland. This does not meet size criteria for imaging follow-up. Electronically Signed   By: Almira Bar M.D.   On: 08/30/2023 22:20   CT Cervical Spine Wo Contrast  Result Date: 08/30/2023 CLINICAL DATA:  The patient fell down 2-3 stair steps, with neck pain, unknown head trauma, left chest and shoulder trauma. EXAM: CT HEAD WITHOUT CONTRAST CT CERVICAL SPINE WITHOUT CONTRAST TECHNIQUE: Multidetector CT imaging of the head and cervical spine was performed following the standard protocol without intravenous contrast. Multiplanar CT image reconstructions of the cervical spine were also generated. RADIATION DOSE REDUCTION: This exam was performed according to the departmental dose-optimization program which includes automated exposure control, adjustment of the mA and/or kV according to patient size and/or use of iterative reconstruction technique. COMPARISON:  Brain MRI 09/29/2022. No prior cervical spine images were plain films. FINDINGS: CT HEAD FINDINGS Brain: There is mild global atrophy, slight atrophic  ventriculomegaly, and mild-to-moderate small-vessel disease of the cerebral white matter. No acute cortical based infarct, hemorrhage, mass or mass effect, or midline shift are seen. There are bilateral calcifications scattered along the falx. Basal cisterns are clear. Vascular: No hyperdense vessel or unexpected calcification. Skull: Negative for fractures or focal lesions. No visible scalp hematoma. Sinuses/Orbits: There is mild membrane thickening in the maxillary and ethmoid sinuses. The sphenoid and frontal sinus, bilateral mastoid air cells and middle ears are clear. The nasal septum is midline. Negative orbits. Other: None. CT CERVICAL SPINE FINDINGS Alignment: There is a straightened cervical lordosis and mild dextroscoliosis, without evidence of listhesis. Narrowing and osteophytes are present of the anterior atlantodental joint with calcified pannus in the posterior joint space. Skull base and vertebrae: There is osteopenia. No acute fracture is evident. No primary bone lesion or focal pathologic process. Soft tissues and spinal canal: No prevertebral fluid or swelling. No visible canal hematoma. There is a 1.3 cm heterogeneous nodule in the right lobe of the thyroid gland. This does not meet size criteria for imaging follow-up (Reference: J Am Coll Radiol. 2015 Feb;12(2): 143-50). No laryngeal mass is seen. Disc levels: There is near complete degenerative disc space loss C5-6 and C6-7, with small bidirectional osteophytes. Posterior disc osteophyte complex C5-6 flattens the ventral cord surface narrowing the thecal sac to 6.8 mm AP. Posterior disc osteophyte complex at C6-7 mildly deforms the ventral cord surface. Other levels show preservation of normal disc heights. There are posterior disc bulges without significant mass effect at C2-3 and C3-4. Multiple levels demonstrate mild facet joint and uncinate spurring. Foraminal stenosis is noted moderately on the right at C5-6,  and mild bilaterally at C6-7  and is not seen at other cervical levels. Upper chest: There are nondisplaced fractures of left posterior second and third ribs, and a partially visible nondisplaced fracture of the proximal shaft of the left clavicle. No apical pneumothorax. Other: None. IMPRESSION: 1. No acute intracranial CT findings or depressed skull fractures. 2. Atrophy and small-vessel disease. 3. Sinus membrane disease. 4. Osteopenia and degenerative change of the cervical spine without evidence of fractures or listhesis. Straightened lordosis with mild dextroscoliosis. 5. Nondisplaced fractures of the left posterior second and third ribs, and partially visible nondisplaced fracture of the proximal shaft of the left clavicle. No apical pneumothorax. 6. 1.3 cm heterogeneous nodule in the right lobe of the thyroid gland. This does not meet size criteria for imaging follow-up. Electronically Signed   By: Almira Bar M.D.   On: 08/30/2023 22:20   CT CHEST ABDOMEN PELVIS W CONTRAST  Result Date: 08/30/2023 CLINICAL DATA:  Larey Seat down 2-3 steps, left rib and shoulder pain EXAM: CT CHEST, ABDOMEN, AND PELVIS WITH CONTRAST TECHNIQUE: Multidetector CT imaging of the chest, abdomen and pelvis was performed following the standard protocol during bolus administration of intravenous contrast. RADIATION DOSE REDUCTION: This exam was performed according to the departmental dose-optimization program which includes automated exposure control, adjustment of the mA and/or kV according to patient size and/or use of iterative reconstruction technique. CONTRAST:  OMNIPAQUE IOHEXOL 300 MG/ML  SOLN COMPARISON:  12/31/2022 FINDINGS: CT CHEST FINDINGS Cardiovascular: The heart is unremarkable without pericardial effusion. No evidence of thoracic aortic aneurysm or dissection. No evidence of vascular injury. Mediastinum/Nodes: No enlarged mediastinal, hilar, or axillary lymph nodes. Thyroid gland, trachea, and esophagus demonstrate no significant findings.  Lungs/Pleura: Trace left pleural fluid. No airspace disease or pneumothorax. Central airways are patent. Musculoskeletal: There are minimally displaced left posterior second, third, and fourth rib fractures. Comminuted minimally displaced fracture extends through the body of the scapula. No other acute bony abnormalities. Chronic appearing T9 and T11 compression fractures are seen, estimated 50% loss of height. Reconstructed images demonstrate no additional findings. CT ABDOMEN PELVIS FINDINGS Hepatobiliary: No focal liver abnormality is seen. Status post cholecystectomy. Expected postsurgical dilatation of the common bile duct. Pancreas: Unremarkable. No pancreatic ductal dilatation or surrounding inflammatory changes. Spleen: No splenic injury or perisplenic hematoma. Adrenals/Urinary Tract: No adrenal hemorrhage or renal injury identified. Bladder is unremarkable. Stomach/Bowel: No bowel obstruction or ileus. Normal appendix right lower quadrant. No bowel wall thickening or inflammatory change. Vascular/Lymphatic: Aortic atherosclerosis. No enlarged abdominal or pelvic lymph nodes. Reproductive: Status post hysterectomy. No adnexal masses. Other: No free fluid or free intraperitoneal gas. No abdominal wall hernia. Musculoskeletal: No acute displaced fractures. Chronic appearing compression deformities involving the superior endplates of L1, L2, and L3. Severe degenerative changes at the L4-5 and L5-S1 level. Reconstructed images demonstrate no additional findings. IMPRESSION: 1. Minimally displaced left posterior second through fourth rib fractures. 2. Comminuted and minimally displaced left scapular fracture. 3. Trace left pleural effusion. 4. No acute intra-abdominal or intrapelvic process. 5.  Aortic Atherosclerosis (ICD10-I70.0). Electronically Signed   By: Sharlet Salina M.D.   On: 08/30/2023 22:07   DG Shoulder Left  Result Date: 08/30/2023 CLINICAL DATA:  Recent fall with left shoulder pain, initial  encounter EXAM: LEFT SHOULDER - 2+ VIEW COMPARISON:  12/31/2022 FINDINGS: Degenerative changes of the acromioclavicular joint are seen. Humeral head is well seated. Irregularity along the tip of the scapula inferiorly is noted which may be related to a mildly displaced fracture.  This can be better evaluated on upcoming CT examination. IMPRESSION: Irregularity along the scapular tip which may represent a focal fracture. This was not well visualized on a prior exam from 12/31/2022. This can be evaluated on upcoming CT. Electronically Signed   By: Alcide Clever M.D.   On: 08/30/2023 20:19   DG Ankle Complete Right  Result Date: 08/30/2023 CLINICAL DATA:  Ankle pain following fall, initial encounter EXAM: RIGHT ANKLE - COMPLETE 3+ VIEW COMPARISON:  None Available. FINDINGS: There is no evidence of fracture, dislocation, or joint effusion. There is no evidence of arthropathy or other focal bone abnormality. Soft tissues are unremarkable. IMPRESSION: No acute abnormality noted. Electronically Signed   By: Alcide Clever M.D.   On: 08/30/2023 20:12        Scheduled Meds:  buPROPion  150 mg Oral Daily   DULoxetine  90 mg Oral Daily   enoxaparin (LOVENOX) injection  45 mg Subcutaneous Q24H   gabapentin  400 mg Oral TID   guaiFENesin  600 mg Oral BID   influenza vaccine adjuvanted  0.5 mL Intramuscular Tomorrow-1000   ketorolac  15 mg Intravenous Q6H   methocarbamol  750 mg Oral Q6H   methotrexate  10 mg Oral Weekly   pantoprazole  20 mg Oral Daily   Continuous Infusions:   LOS: 1 day    Tresa Moore, MD [facility]   If 7PM-7AM, please contact night-coverage  09/01/2023, 1:50 PM

## 2023-09-01 NOTE — Evaluation (Signed)
Occupational Therapy Evaluation Patient Details Name: Emily Landry MRN: [id number] DOB: [birth year] Today's Date: 09/01/2023   History of Present Illness Pt is a 72 y/o female presenting following a fall down 3-4 stairs and complaints of L shoulder, L rib, and R ankle pain. Imaging revealing minimally displaced left posterior second through fourth rib fractures, comminuted and minimally displaced left scapular fracture, and no ankle fracture. PMH includes rheumatoid arthritis, anxiety, lumbar laminectomy, chest pain, and HTN.   Clinical Impression   Patient presenting with decreased Ind in self care,balance, functional mobility/transfers, endurance, and safety awareness. Patient reports being Ind at baseline and living with daughter. Multiple fxs with increased pain during session. MD arrives to discuss pain management plan.  Patient currently functioning at set up A for grooming tasks while seated. Pt stands with mod A and takes steps 10' with min HHA and back to chair. Daughter want's to take pt home at discharge. OT asked for aircast from pt and daughter to bring shoes to attempt steps next session.  Patient will benefit from acute OT to increase overall independence in the areas of ADLs, functional mobility, and safety awareness in order to safely discharge.      If plan is discharge home, recommend the following: A lot of help with walking and/or transfers;A lot of help with bathing/dressing/bathroom;Assistance with cooking/housework;Assist for transportation;Help with stairs or ramp for entrance    Functional Status Assessment  Patient has had a recent decline in their functional status and demonstrates the ability to make significant improvements in function in a reasonable and predictable amount of time.  Equipment Recommendations  None recommended by OT       Precautions / Restrictions Precautions Precautions: Shoulder Shoulder Interventions: Shoulder sling/immobilizer Required  Braces or Orthoses: Sling Restrictions Weight Bearing Restrictions: Yes LUE Weight Bearing: Non weight bearing      Mobility Bed Mobility               General bed mobility comments: seated in recliner chair at beginning/end of session    Transfers Overall transfer level: Needs assistance Equipment used: 1 person hand held assist, None Transfers: Sit to/from Stand Sit to Stand: Mod assist                  Balance Overall balance assessment: Needs assistance Sitting-balance support: Feet unsupported, Single extremity supported Sitting balance-Leahy Scale: Good     Standing balance support: Single extremity supported Standing balance-Leahy Scale: Poor                             ADL either performed or assessed with clinical judgement   ADL Overall ADL's : Needs assistance/impaired     Grooming: Wash/dry hands;Wash/dry face;Oral care;Sitting;Set up                                       Vision Patient Visual Report: No change from baseline              Pertinent Vitals/Pain Pain Assessment Pain Assessment: 0-10 Pain Score: 10-Worst pain ever Pain Location: Ribs, L UE, and R ankle Pain Descriptors / Indicators: Grimacing, Moaning, Constant, Guarding Pain Intervention(s): Monitored during session, Premedicated before session, Repositioned, Limited activity within patient's tolerance     Extremity/Trunk Assessment Upper Extremity Assessment Upper Extremity Assessment: LUE deficits/detail;Right hand dominant LUE: Unable to fully assess due to pain;Unable  to fully assess due to immobilization           Communication Communication Communication: No apparent difficulties   Cognition Arousal: Alert Behavior During Therapy: WFL for tasks assessed/performed Overall Cognitive Status: Within Functional Limits for tasks assessed                                       General Comments  SPO2 remained >90%  throughout session on 2L O2            Home Living Family/patient expects to be discharged to:: Private residence Living Arrangements: Children Available Help at Discharge: Family;Available PRN/intermittently Type of Home: House Home Access: Stairs to enter [employer] of Steps: 3 Entrance Stairs-Rails: None Home Layout: Two level;Able to live on main level with bedroom/bathroom     Bathroom Shower/Tub: Producer, television/film/video: Handicapped height     Home Equipment: Shower seat - built in;BSC/3in1;Cane - single point          Prior Functioning/Environment Prior Level of Function : Independent/Modified Independent             Mobility Comments: does not use AD at baseline ADLs Comments: IND with all ADLs        OT Problem List: Decreased strength;Decreased activity tolerance;Decreased safety awareness;Impaired balance (sitting and/or standing);Decreased knowledge of use of DME or AE;Impaired UE functional use;Decreased knowledge of precautions;Pain      OT Treatment/Interventions: Self-care/ADL training;Energy conservation;DME and/or AE instruction;Patient/family education;Balance training;Therapeutic activities    OT Goals(Current goals can be found in the care plan section) Acute Rehab OT Goals Patient Stated Goal: to go home OT Goal Formulation: With patient/family Time For Goal Achievement: 09/15/23 Potential to Achieve Goals: Fair ADL Goals Pt Will Perform Grooming: with supervision;standing Pt Will Perform Lower Body Dressing: with min assist;sit to/from stand Pt Will Transfer to Toilet: with min assist;ambulating Pt Will Perform Toileting - Clothing Manipulation and hygiene: with min assist;sit to/from stand  OT Frequency: Min 1X/week       AM-PAC OT "6 Clicks" Daily Activity     Outcome Measure Help from another person eating meals?: A Little Help from another person taking care of personal grooming?: A Little Help from another  person toileting, which includes using toliet, bedpan, or urinal?: A Lot Help from another person bathing (including washing, rinsing, drying)?: A Lot Help from another person to put on and taking off regular upper body clothing?: A Lot Help from another person to put on and taking off regular lower body clothing?: A Lot 6 Click Score: 14   End of Session Nurse Communication: Mobility status;Other (comment) (asked MD for aircast and asked daughter in room to bring shoes from home)  Activity Tolerance: Patient tolerated treatment well;Patient limited by pain Patient left: with call bell/phone within reach;in chair;with chair alarm set;with family/visitor present  OT Visit Diagnosis: Unsteadiness on feet (R26.81);Repeated falls (R29.6);Muscle weakness (generalized) (M62.81)                Time: 1110-1135 OT Time Calculation (min): 25 min Charges:  OT General Charges $OT Visit: 1 Visit OT Evaluation $OT Eval Moderate Complexity: 1 Mod OT Treatments $Self Care/Home Management : 8-22 mins  Jackquline Denmark, MS, OTR/L , CBIS ascom [phone]  09/01/23, 1:20 PM

## 2023-09-01 NOTE — Progress Notes (Addendum)
Physical Therapy Treatment Patient Details Name: Emily Landry MRN: [id number] DOB: October 09, 1951 Today's Date: 09/01/2023   History of Present Illness Pt is a 72 y/o female presenting following a fall down 3-4 stairs and complaints of L shoulder, L rib, and R ankle pain. Imaging revealing minimally displaced left posterior second through fourth rib fractures, comminuted and minimally displaced left scapular fracture, and no ankle fracture. PMH includes rheumatoid arthritis, anxiety, lumbar laminectomy, chest pain, and HTN.    PT Comments  Pt presents laying in bed with family in room, 10/10 pain following mobility. She required mod-maxAx1 for supine>sit and modAx1 for step pivot to recliner. She continues to be limited by pain that worsens with transitional movements, but has shown slight improvement in activity tolerance compared to previous session. She would benefit from continued skilled care to maximize functional abilities.   PT recommendation/plan section updated as appropriate and care team updated as well.    If plan is discharge home, recommend the following: A lot of help with walking and/or transfers;A lot of help with bathing/dressing/bathroom;Assistance with cooking/housework;Assist for transportation;Help with stairs or ramp for entrance;Direct supervision/assist for medications management   Can travel by private vehicle     No  Equipment Recommendations  None recommended by PT    Recommendations for Other Services       Precautions / Restrictions Precautions Precautions: Shoulder Shoulder Interventions: Shoulder sling/immobilizer Required Braces or Orthoses: Sling Restrictions Weight Bearing Restrictions: Yes LUE Weight Bearing: Non weight bearing     Mobility  Bed Mobility Overal bed mobility: Needs Assistance Bed Mobility: Supine to Sit       Sit to supine: Mod assist, Max assist   General bed mobility comments: mod-maxA due to pain with transitional  movements    Transfers Overall transfer level: Needs assistance Equipment used: None Transfers: Sit to/from Stand, Bed to chair/wheelchair/BSC Sit to Stand: Mod assist   Step pivot transfers: Mod assist            Ambulation/Gait               General Gait Details: unable due to pain   Stairs             Wheelchair Mobility     Tilt Bed    Modified Rankin (Stroke Patients Only)       Balance Overall balance assessment: Needs assistance Sitting-balance support: Feet unsupported, Single extremity supported Sitting balance-Leahy Scale: Good     Standing balance support: No upper extremity supported Standing balance-Leahy Scale: Poor Standing balance comment: continues to require minA for standing 2/2 pain                            Cognition Arousal: Alert Behavior During Therapy: WFL for tasks assessed/performed Overall Cognitive Status: Within Functional Limits for tasks assessed                                          Exercises      General Comments General comments (skin integrity, edema, etc.): SPO2 remained >90% throughout session on 2L O2      Pertinent Vitals/Pain Pain Assessment Pain Assessment: 0-10 Pain Score: 10-Worst pain ever Pain Location: Ribs/ LUE following mobility Pain Descriptors / Indicators: Grimacing, Moaning, Crying, Constant, Sharp Pain Intervention(s): Monitored during session, Limited activity within patient's tolerance    Home Living  Prior Function            PT Goals (current goals can now be found in the care plan section) Acute Rehab PT Goals Patient Stated Goal: return home Progress towards PT goals: Progressing toward goals    Frequency    Min 1X/week      PT Plan      Co-evaluation              AM-PAC PT "6 Clicks" Mobility   Outcome Measure  Help needed turning from your back to your side while in a flat bed  without using bedrails?: A Lot Help needed moving from lying on your back to sitting on the side of a flat bed without using bedrails?: A Lot Help needed moving to and from a bed to a chair (including a wheelchair)?: A Lot Help needed standing up from a chair using your arms (e.g., wheelchair or bedside chair)?: A Lot Help needed to walk in hospital room?: A Lot Help needed climbing 3-5 steps with a railing? : Total 6 Click Score: 11    End of Session Equipment Utilized During Treatment: Other (comment) (LUE sling) Activity Tolerance: Patient limited by pain Patient left: in chair;with call bell/phone within reach;with family/visitor present Nurse Communication: Mobility status PT Visit Diagnosis: Other abnormalities of gait and mobility (R26.89);History of falling (Z91.81);Pain Pain - Right/Left: Left Pain - part of body: Shoulder (Ribs)     Time: 9518-8416 PT Time Calculation (min) (ACUTE ONLY): 37 min  Charges:    $Therapeutic Activity: 38-52 mins PT General Charges $$ ACUTE PT VISIT: 1 Visit                     Seth Higginbotham, PT, SPT 12:12 PM,09/01/23

## 2023-09-01 NOTE — Plan of Care (Signed)

## 2023-09-02 ENCOUNTER — Inpatient Hospital Stay: Payer: Medicare HMO

## 2023-09-02 DIAGNOSIS — S2242XA Multiple fractures of ribs, left side, initial encounter for closed fracture: Secondary | ICD-10-CM | POA: Diagnosis not present

## 2023-09-02 MED ORDER — IPRATROPIUM-ALBUTEROL 0.5-2.5 (3) MG/3ML IN SOLN
3.0000 mL | RESPIRATORY_TRACT | Status: DC | PRN
  Administered 2023-09-02: 3 mL via RESPIRATORY_TRACT
  Filled 2023-09-02: qty 3

## 2023-09-02 NOTE — Progress Notes (Signed)
1041 MD made aware neb treatment was given. Per MD importance for today IS and flutter. Nurse explained proper use of IS to pt with teach back method. Pt can reach 1250 witnessed by this nurse. Pt verbalized understanding that IS needs to be used x10 every hour unless resting for today

## 2023-09-02 NOTE — Progress Notes (Signed)
PROGRESS NOTE    Emily Landry  UXN:[id number] DOB: 1951-08-10 DOA: 08/30/2023 PCP: Kandyce Rud, MD    Brief Narrative:   72 y.o. female with medical history significant for Rheumatoid arthritis, anxiety, lumbar laminectomy with chronic pain on chronic opiates, HTN who presents to the ED following an accidental fall while walking down the stairs.  States the drywall with a handrail at the side of the staircase was removed for elevation and she fell sideways of the staircase from the height of about the third or fourth step from the bottom of the staircase.  Had no loss of consciousness.  She presented with left shoulder pain left rib pain and back pain as well as right ankle pain and neck pain.  She was previously in her usual state of health   9/18: Patient seen in consultation by orthopedics.  Conservative management with sling and immobility recommended.  Patient still in a substantial amount of pain requiring intravenous narcotic use.  9/19: Pain control improved   Assessment & Plan:   Principal Problem:   Multiple left rib fractures, initial encounter Active Problems:   Fall (on) (from) unspecified stairs and steps, initial encounter   Seronegative rheumatoid arthritis (HCC)   Chronic pain syndrome   Chronic, continuous use of opioids   Hypertension   Generalized anxiety disorder   Closed left scapular fracture  * Multiple left rib fractures, initial encounter Left scapular fracture Accidental fall down stairs, initial encounter Lengthy discussion with patient and daughter at bedside on 9/18.  Patient is responding to multimodal pain control.  Continue plan of care.  Minimize IV narcotic use.  No plans for surgical intervention.  Plan for discharge home 9/20 with home health.   Seronegative rheumatoid arthritis (HCC) Chronic pain syndrome Continue home methotrexate Continue duloxetine, gabapentin   Hypertension No antihypertensives listed on home medication  regimen Follow-up outpatient   Generalized anxiety disorder Continue Cymbalta and Wellbutrin   DVT prophylaxis: SQ Lovenox Code Status: Full Family Communication: Daughter at bedside 9/18 Disposition Plan: Status is: Inpatient Remains inpatient appropriate because: Intractable pain   Level of care: Telemetry Medical  Consultants:  Orthopedics  Procedures:  None  Antimicrobials: None   Subjective: Seen and examined.  Lying in bed.  Continues to complain of pain though improved from prior.  Objective: Vitals:   09/01/23 0811 09/01/23 0835 09/02/23 0017 09/02/23 0804  BP:   114/64 (!) 105/56  Pulse:  94 69 75  Resp:   18 20  Temp:   (!) 97.5 F (36.4 C) 98 F (36.7 C)  TempSrc:    Oral  SpO2: 91% 92% 91%   Weight:      Height:        Intake/Output Summary (Last 24 hours) at 09/02/2023 1352 Last data filed at 09/02/2023 1200 Gross per 24 hour  Intake 240 ml  Output 900 ml  Net -660 ml   Filed Weights   08/30/23 1751  Weight: 95.3 kg    Examination:  General exam: Appears fatigued Respiratory system: Coarse breath sounds.  Normal work of breathing.  Room air Cardiovascular system: S1-S2, RRR, no murmurs, no pedal edema Gastrointestinal system: Soft,/ND, normal bowel sounds Central nervous system: Alert and oriented. No focal neurological deficits. Extremities: Shoulder in sling.  Limited range of motion left upper extremity Skin: No rashes, lesions or ulcers Psychiatry: Judgement and insight appear normal. Mood & affect appropriate.     Data Reviewed: I have personally reviewed following labs and imaging studies  CBC:  Recent Labs  Lab 08/30/23 1847 08/31/23 0145  WBC 12.0* 9.4  NEUTROABS 9.4*  --   HGB 14.7 14.3  HCT 44.0 43.3  MCV 90.3 91.7  PLT 241 216   Basic Metabolic Panel: Recent Labs  Lab 08/30/23 1847 08/31/23 0145  NA 138  --   K 3.6  --   CL 101  --   CO2 23  --   GLUCOSE 98  --   BUN 16  --   CREATININE 0.52 0.57   CALCIUM 9.2  --    GFR: Estimated Creatinine Clearance: 76.8 mL/min (by C-G formula based on SCr of 0.57 mg/dL). Liver Function Tests: Recent Labs  Lab 08/30/23 1847  AST 39  ALT 26  ALKPHOS 97  BILITOT 1.0  PROT 7.3  ALBUMIN 4.2   No results for input(s): "LIPASE", "AMYLASE" in the last 168 hours. No results for input(s): "AMMONIA" in the last 168 hours. Coagulation Profile: No results for input(s): "INR", "PROTIME" in the last 168 hours. Cardiac Enzymes: No results for input(s): "CKTOTAL", "CKMB", "CKMBINDEX", "TROPONINI" in the last 168 hours. BNP (last 3 results) No results for input(s): "PROBNP" in the last 8760 hours. HbA1C: No results for input(s): "HGBA1C" in the last 72 hours. CBG: No results for input(s): "GLUCAP" in the last 168 hours. Lipid Profile: No results for input(s): "CHOL", "HDL", "LDLCALC", "TRIG", "CHOLHDL", "LDLDIRECT" in the last 72 hours. Thyroid Function Tests: No results for input(s): "TSH", "T4TOTAL", "FREET4", "T3FREE", "THYROIDAB" in the last 72 hours. Anemia Panel: No results for input(s): "VITAMINB12", "FOLATE", "FERRITIN", "TIBC", "IRON", "RETICCTPCT" in the last 72 hours. Sepsis Labs: No results for input(s): "PROCALCITON", "LATICACIDVEN" in the last 168 hours.  No results found for this or any previous visit (from the past 240 hour(s)).       Radiology Studies: No results found.      Scheduled Meds:  buPROPion  150 mg Oral Daily   DULoxetine  90 mg Oral Daily   enoxaparin (LOVENOX) injection  45 mg Subcutaneous Q24H   gabapentin  400 mg Oral TID   guaiFENesin  600 mg Oral BID   influenza vaccine adjuvanted  0.5 mL Intramuscular Tomorrow-1000   ketorolac  15 mg Intravenous Q6H   lidocaine  1 patch Transdermal Q24H   methocarbamol  750 mg Oral Q6H   methotrexate  10 mg Oral Weekly   pantoprazole  20 mg Oral Daily   Continuous Infusions:   LOS: 2 days    Tresa Moore, MD [facility]   If 7PM-7AM,  please contact night-coverage  09/02/2023, 1:52 PM

## 2023-09-02 NOTE — Plan of Care (Signed)

## 2023-09-02 NOTE — TOC Progression Note (Signed)
Transition of Care [facility]) - Progression Note    Patient Details  Name: Emily Landry MRN: [id number] Date of Birth: January 01, 1951  Transition of Care [facility]) CM/SW Contact  Garret Reddish, RN Phone Number: 09/02/2023, 2:54 PM  Clinical Narrative:     Chart reviewed.  Noted that patient was admitted with multiple left rib fractures due to a fall at home.    I have meet with patient and her daughter at bedside today.  I have spoken to patient and her daughter about home health services.  Patient did not have a home health preference.  I  have asked [place] to accept home health referral.  Patient and her daughter have requested PT, OT, and RN for home services.   PT reports no needed DME.  Patient informs me that she does have a wheelchair, and BSC at  home.    Patient will remain in hospital for pain control today.  Tentative discharge planned for tomorrow.    TOC will continue to follow for discharge planning.     Expected Discharge Plan: Home w Home Health Services Barriers to Discharge: Continued Medical Work up (Working on getting pain management under control.)  Expected Discharge Plan and Services   Discharge Planning Services: CM Consult Post Acute Care Choice: Home Health Living arrangements for the past 2 months: Single Family Home                   DME Agency:  (Patient has a BSC, and wheelchair at home.)       HH Arranged: RN, PT, OT HH Agency: [facility] Date [facility] Agency Contacted: 09/02/23 Time HH Agency Contacted: 1400 Representative spoke with at [facility] Agency: [place]   Social Determinants of Health (SDOH) Interventions SDOH Screenings   Food Insecurity: No Food Insecurity (08/31/2023)  Housing: Low Risk  (08/31/2023)  Transportation Needs: No Transportation Needs (08/31/2023)  Utilities: Not At Risk (08/31/2023)  Depression (PHQ2-9): Low Risk  (03/16/2023)  Financial Resource Strain: Low Risk  (01/14/2023)   Received from [facility],  [facility]  Tobacco Use: Medium Risk (08/30/2023)    Readmission Risk Interventions     No data to display

## 2023-09-02 NOTE — Progress Notes (Signed)
Physical Therapy Treatment Patient Details Name: Emily Landry MRN: [id number] DOB: Feb 17, 1951 Today's Date: 09/02/2023   History of Present Illness Pt is a 72 y/o female presenting following a fall down 3-4 stairs and complaints of L shoulder, L rib, and R ankle pain. Imaging revealing minimally displaced left posterior second through fourth rib fractures, comminuted and minimally displaced left scapular fracture, and no ankle fracture. PMH includes rheumatoid arthritis, anxiety, lumbar laminectomy, chest pain, and HTN.    PT Comments  Pt alert, up in recliner at start/end of session. R ankle wrapped with ace wraps to assist with pain/stability as able. Overall the patient demonstrated improvement in mobility compared to previous session, care team updated of pt progress. Several sit <> stand transfers throughout, CGA with cues for [facility] placement/hand placement to improve safety and independence. She ambulated ~192ft total with chair follow for safety. She also performed stair navigation (4 steps, step to pattern), progressed from modA handheld assist to CGA-minA with SPC. Pt fearful but cautious with stairs, encouragement and education provided. The patient would benefit from further skilled PT intervention to continue to progress towards goals.        If plan is discharge home, recommend the following: A lot of help with bathing/dressing/bathroom;Assistance with cooking/housework;Assist for transportation;Help with stairs or ramp for entrance;Direct supervision/assist for medications management;A little help with walking and/or transfers   Can travel by private vehicle     No  Equipment Recommendations  None recommended by PT    Recommendations for Other Services       Precautions / Restrictions Precautions Precautions: Shoulder Shoulder Interventions: Shoulder sling/immobilizer Required Braces or Orthoses: Sling Restrictions Weight Bearing Restrictions: Yes LUE Weight Bearing: Non  weight bearing     Mobility  Bed Mobility               General bed mobility comments: seated in recliner chair at beginning/end of session    Transfers Overall transfer level: Needs assistance Equipment used: 1 person hand held assist, None Transfers: Sit to/from Stand Sit to Stand: Contact guard assist, Min assist                Ambulation/Gait Ambulation/Gait assistance: Contact guard assist Gait Distance (Feet): 100 Feet Assistive device: 1 person hand held assist, Straight cane         General Gait Details: initially with rail in hallway, progressed to [facility]. noted for decreased gait velocity, cued to normalize step length and SPC placement   Stairs Stairs: Yes Stairs assistance: Min assist, Contact guard assist, +2 safety/equipment, Mod assist Stair Management: No rails, Step to pattern, With cane Number of Stairs: 4 General stair comments: very slow, cautious attempts at stairs. pt fearful. progressed to minA-CGA with SPC use vs handheld assistance   Wheelchair Mobility     Tilt Bed    Modified Rankin (Stroke Patients Only)       Balance Overall balance assessment: Needs assistance Sitting-balance support: Feet unsupported, Single extremity supported Sitting balance-Leahy Scale: Good     Standing balance support: Single extremity supported Standing balance-Leahy Scale: Fair                              Cognition Arousal: Alert Behavior During Therapy: WFL for tasks assessed/performed Overall Cognitive Status: Within Functional Limits for tasks assessed  Exercises      General Comments        Pertinent Vitals/Pain Pain Assessment Pain Assessment: Faces Pain Score: 5  Pain Location: Ribs, L UE, and R ankle Pain Descriptors / Indicators: Grimacing, Moaning, Constant, Guarding Pain Intervention(s): Limited activity within patient's tolerance, Monitored during  session, Repositioned    Home Living                          Prior Function            PT Goals (current goals can now be found in the care plan section) Progress towards PT goals: Progressing toward goals    Frequency    Min 1X/week      PT Plan      Co-evaluation              AM-PAC PT "6 Clicks" Mobility   Outcome Measure  Help needed turning from your back to your side while in a flat bed without using bedrails?: A Little Help needed moving from lying on your back to sitting on the side of a flat bed without using bedrails?: A Little Help needed moving to and from a bed to a chair (including a wheelchair)?: A Little Help needed standing up from a chair using your arms (e.g., wheelchair or bedside chair)?: A Little Help needed to walk in hospital room?: A Little Help needed climbing 3-5 steps with a railing? : A Little 6 Click Score: 18    End of Session Equipment Utilized During Treatment: Gait belt Activity Tolerance: Patient limited by pain Patient left: in chair;with call bell/phone within reach;with family/visitor present Nurse Communication: Mobility status PT Visit Diagnosis: Other abnormalities of gait and mobility (R26.89);History of falling (Z91.81);Pain Pain - Right/Left: Left Pain - part of body: Shoulder (Ribs)     Time: 1610-9604 PT Time Calculation (min) (ACUTE ONLY): 38 min  Charges:    $Gait Training: 8-22 mins $Therapeutic Activity: 23-37 mins PT General Charges $$ ACUTE PT VISIT: 1 Visit                    Olga Coaster PT, DPT 3:05 PM,09/02/23

## 2023-09-03 DIAGNOSIS — S2242XA Multiple fractures of ribs, left side, initial encounter for closed fracture: Secondary | ICD-10-CM | POA: Diagnosis not present

## 2023-09-03 MED ORDER — SENNOSIDES-DOCUSATE SODIUM 8.6-50 MG PO TABS: 1.0000 | ORAL_TABLET | Freq: Two times a day (BID) | ORAL | 0 refills | Status: AC

## 2023-09-03 MED ORDER — BISACODYL 10 MG RE SUPP: 10.0000 mg | Freq: Every day | RECTAL | Status: DC | PRN

## 2023-09-03 MED ORDER — SENNOSIDES-DOCUSATE SODIUM 8.6-50 MG PO TABS: 1.0000 | ORAL_TABLET | Freq: Two times a day (BID) | ORAL | Status: DC

## 2023-09-03 MED ORDER — OXYCODONE HCL 5 MG PO TABS: 5.0000 mg | ORAL_TABLET | ORAL | 0 refills | Status: DC | PRN

## 2023-09-03 MED ORDER — ACETAMINOPHEN 500 MG PO TABS: 500.0000 mg | ORAL_TABLET | Freq: Four times a day (QID) | ORAL | Status: AC | PRN

## 2023-09-03 MED ORDER — POLYETHYLENE GLYCOL 3350 17 G PO PACK: 17.0000 g | PACK | Freq: Every day | ORAL | Status: DC

## 2023-09-03 MED ORDER — LIDOCAINE 5 % EX PTCH: 1.0000 | MEDICATED_PATCH | CUTANEOUS | 0 refills | Status: DC

## 2023-09-03 MED ORDER — METHOCARBAMOL 750 MG PO TABS: 750.0000 mg | ORAL_TABLET | Freq: Four times a day (QID) | ORAL | 0 refills | Status: AC

## 2023-09-03 MED ORDER — POLYETHYLENE GLYCOL 3350 17 G PO PACK: 17.0000 g | PACK | Freq: Every day | ORAL | 0 refills | Status: AC | PRN

## 2023-09-03 MED ORDER — GABAPENTIN 400 MG PO CAPS: 400.0000 mg | ORAL_CAPSULE | Freq: Three times a day (TID) | ORAL | 0 refills | Status: DC

## 2023-09-03 NOTE — TOC Transition Note (Signed)
Transition of Care [facility]) - CM/SW Discharge Note   Patient Details  Name: Emily Landry MRN: [id number] Date of Birth: 18-Nov-1951  Transition of Care [facility]) CM/SW Contact:  Garret Reddish, RN Phone Number: 09/03/2023, 10:30 AM   Clinical Narrative:     Chart reviewed.  Noted that patient has orders for discharge today.    I have made [place] with [facility] aware that patient would be a discharge home for today.    I have spoken with patient and her daughter Jonita Albee and confirmed that [facility] will provide Home Health services for Home Health PT, OT and RN.  I have informed staff nurse of the above information.    Final next level of care: Home w Home Health Services Barriers to Discharge: No Barriers Identified   Patient Goals and CMS Choice CMS Medicare.gov Compare Post Acute Care list provided to:: Patient Choice offered to / list presented to : Patient  Discharge Placement                    Name of family member notified: Jonita Albee, Patient's daughter Patient and family notified of of transfer: 09/03/23  Discharge Plan and Services Additional resources added to the After Visit Summary for     Discharge Planning Services: CM Consult Post Acute Care Choice: Home Health            DME Agency:  (Patient has a BSC, and wheelchair at home.)       HH Arranged: RN, PT, OT [facility] Agency: [facility] Date [facility] Agency Contacted: 09/02/23 Time HH Agency Contacted: 1400 Representative spoke with at [facility] Agency: [place]  Social Determinants of Health (SDOH) Interventions SDOH Screenings   Food Insecurity: No Food Insecurity (08/31/2023)  Housing: Low Risk  (08/31/2023)  Transportation Needs: No Transportation Needs (08/31/2023)  Utilities: Not At Risk (08/31/2023)  Depression (PHQ2-9): Low Risk  (03/16/2023)  Financial Resource Strain: Low Risk  (01/14/2023)   Received from [facility], [facility]  Tobacco Use: Medium Risk (08/30/2023)      Readmission Risk Interventions     No data to display

## 2023-09-03 NOTE — Progress Notes (Signed)
Discharged. Home with daughter. AVS printed and reviewed. All questions answered.

## 2023-09-03 NOTE — Progress Notes (Signed)
Occupational Therapy Treatment Patient Details Name: Arbor [place] MRN: [id number] DOB: [birth year] Today's Date: 09/03/2023   History of present illness Pt is a 72 y/o female presenting following a fall down 3-4 stairs and complaints of L shoulder, L rib, and R ankle pain. Imaging revealing minimally displaced left posterior second through fourth rib fractures, comminuted and minimally displaced left scapular fracture, and no ankle fracture. PMH includes rheumatoid arthritis, anxiety, lumbar laminectomy, chest pain, and HTN.   OT comments  Upon entering the room, pt supine in seated in recliner chair with daughter present in room. Pt reports need for toileting and OT assisted her with CGA- Min HHA with ambulation of ~ 10' into bathroom. Pt seated on commode and able to void. Pt needing assistance with threading B LEs into LB clothing secondary to pain. She utilized wipes for hygiene with set up A and stands with min A to pull pants over B hips. She needed assistance to pull over L hip and returns to recliner chair. OT assisted pt with UB dressing and donning sling with education for techniques provided to family in room. Did recommend use of gown if needed at home to increase ease for toileting at home. Pt seated in recliner chair at end of session with all needs within reach.       If plan is discharge home, recommend the following:  A lot of help with walking and/or transfers;A lot of help with bathing/dressing/bathroom;Assistance with cooking/housework;Assist for transportation;Help with stairs or ramp for entrance   Equipment Recommendations  None recommended by OT       Precautions / Restrictions Precautions Precautions: Shoulder Shoulder Interventions: Shoulder sling/immobilizer Required Braces or Orthoses: Sling Restrictions Weight Bearing Restrictions: Yes LUE Weight Bearing: Non weight bearing       Mobility Bed Mobility               General bed mobility comments:  seated in recliner chair at beginning/end of session    Transfers Overall transfer level: Needs assistance Equipment used: 1 person hand held assist Transfers: Sit to/from Stand Sit to Stand: Contact guard assist, Min assist                 Balance Overall balance assessment: Needs assistance Sitting-balance support: Feet unsupported, Single extremity supported Sitting balance-Leahy Scale: Good     Standing balance support: Single extremity supported, During functional activity Standing balance-Leahy Scale: Fair                             ADL either performed or assessed with clinical judgement   ADL Overall ADL's : Needs assistance/impaired     Grooming: Wash/dry hands;Supervision/safety;Standing           Upper Body Dressing : Moderate assistance;Sitting   Lower Body Dressing: Moderate assistance;Sit to/from stand                        Cognition Arousal: Alert Behavior During Therapy: WFL for tasks assessed/performed Overall Cognitive Status: Within Functional Limits for tasks assessed                                                     Pertinent Vitals/ Pain       Pain Assessment Pain Assessment: 0-10 Faces Pain  Scale: Hurts little more Pain Location: Ribs, L UE Pain Descriptors / Indicators: Grimacing, Moaning, Constant, Guarding Pain Intervention(s): Limited activity within patient's tolerance, Monitored during session, Repositioned         Frequency  Min 1X/week        Progress Toward Goals  OT Goals(current goals can now be found in the care plan section)  Progress towards OT goals: Progressing toward goals      AM-PAC OT "6 Clicks" Daily Activity     Outcome Measure   Help from another person eating meals?: A Little Help from another person taking care of personal grooming?: A Little Help from another person toileting, which includes using toliet, bedpan, or urinal?: A Lot Help from  another person bathing (including washing, rinsing, drying)?: A Lot Help from another person to put on and taking off regular upper body clothing?: A Lot Help from another person to put on and taking off regular lower body clothing?: A Lot 6 Click Score: 14    End of Session    OT Visit Diagnosis: Unsteadiness on feet (R26.81);Repeated falls (R29.6);Muscle weakness (generalized) (M62.81)   Activity Tolerance Patient tolerated treatment well   Patient Left with call bell/phone within reach;in chair;with family/visitor present   Nurse Communication Mobility status        Time: 4132-4401 OT Time Calculation (min): 20 min  Charges: OT General Charges $OT Visit: 1 Visit OT Treatments $Self Care/Home Management : 8-22 mins  Jackquline Denmark, MS, OTR/L , CBIS ascom [phone]  09/03/23, 11:52 AM

## 2023-09-03 NOTE — Progress Notes (Signed)
Physical Therapy Treatment Patient Details Name: Emily Landry MRN: [id number] DOB: [birth year] Today's Date: 09/03/2023   History of Present Illness Pt is a 72 y/o female presenting following a fall down 3-4 stairs and complaints of L shoulder, L rib, and R ankle pain. Imaging revealing minimally displaced left posterior second through fourth rib fractures, comminuted and minimally displaced left scapular fracture, and no ankle fracture. PMH includes rheumatoid arthritis, anxiety, lumbar laminectomy, chest pain, and HTN.    PT Comments  Patient alert, agreeable to PT, reported 5/10 pain in L ribs mostly. She continued to demonstrate progression towards goals. minA and step by step cues for technique for supine to sit. Shoes donned with maxA prior to mobility, pt declined ace wrap for RLE. Several sit <> Stands with SPC and CGA, effortful but no assistance needed. She ambulated ~144ft during the session with SPC and CGA as well, no LOB noted though decreased gait velocity. Pt able to perform stair navigation with CGA, SPC and step by step cueing for proper technique. The patient would benefit from further skilled PT intervention to continue to progress towards goals.  Pt on room air on PT entrance, but with mobility decreased to 86% despite rest breaks and cues for PLB. Donned [place] (2L) for all mobility, returned to room air at end of session, 93% noted upon PT exit.    If plan is discharge home, recommend the following: A lot of help with bathing/dressing/bathroom;Assistance with cooking/housework;Assist for transportation;Help with stairs or ramp for entrance;Direct supervision/assist for medications management;A little help with walking and/or transfers   Can travel by private vehicle     No  Equipment Recommendations  None recommended by PT    Recommendations for Other Services       Precautions / Restrictions Precautions Precautions: Shoulder Shoulder Interventions: Shoulder  sling/immobilizer Restrictions Weight Bearing Restrictions: Yes LUE Weight Bearing: Non weight bearing     Mobility  Bed Mobility Overal bed mobility: Needs Assistance Bed Mobility: Supine to Sit     Supine to sit: Min assist     General bed mobility comments: extra time, cues for technique/hand placement    Transfers Overall transfer level: Needs assistance Equipment used: Straight cane Transfers: Sit to/from Stand Sit to Stand: Contact guard assist           General transfer comment: effortful but pt able to complete without assistance    Ambulation/Gait Ambulation/Gait assistance: Contact guard assist Gait Distance (Feet): 100 Feet Assistive device: Straight cane         General Gait Details: shoes donned prior to mobility. CGA throughout but no LOB. decreased velocity   Stairs Stairs: Yes Stairs assistance: Contact guard assist   Number of Stairs: 4 General stair comments: improved tolerance noted, still needed step by step cueing for safety, overall CGA   Wheelchair Mobility     Tilt Bed    Modified Rankin (Stroke Patients Only)       Balance Overall balance assessment: Needs assistance Sitting-balance support: Feet unsupported, Single extremity supported Sitting balance-Leahy Scale: Good     Standing balance support: Single extremity supported Standing balance-Leahy Scale: Fair                              Cognition Arousal: Alert Behavior During Therapy: WFL for tasks assessed/performed Overall Cognitive Status: Within Functional Limits for tasks assessed  Exercises      General Comments        Pertinent Vitals/Pain Pain Assessment Pain Assessment: 0-10 Pain Score: 5  Pain Location: Ribs, L UE Pain Descriptors / Indicators: Grimacing, Moaning, Constant, Guarding Pain Intervention(s): Limited activity within patient's tolerance, Monitored during session,  Repositioned    Home Living                          Prior Function            PT Goals (current goals can now be found in the care plan section) Progress towards PT goals: Progressing toward goals    Frequency    Min 1X/week      PT Plan      Co-evaluation              AM-PAC PT "6 Clicks" Mobility   Outcome Measure  Help needed turning from your back to your side while in a flat bed without using bedrails?: A Little Help needed moving from lying on your back to sitting on the side of a flat bed without using bedrails?: A Little Help needed moving to and from a bed to a chair (including a wheelchair)?: A Little Help needed standing up from a chair using your arms (e.g., wheelchair or bedside chair)?: A Little Help needed to walk in hospital room?: A Little Help needed climbing 3-5 steps with a railing? : A Little 6 Click Score: 18    End of Session Equipment Utilized During Treatment: Gait belt Activity Tolerance: Patient tolerated treatment well Patient left: in chair;with call bell/phone within reach Nurse Communication: Mobility status PT Visit Diagnosis: Other abnormalities of gait and mobility (R26.89);History of falling (Z91.81);Pain Pain - Right/Left: Left Pain - part of body: Shoulder (Ribs)     Time: 0827-0900 PT Time Calculation (min) (ACUTE ONLY): 33 min  Charges:    $Gait Training: 8-22 mins $Therapeutic Activity: 8-22 mins PT General Charges $$ ACUTE PT VISIT: 1 Visit                     Olga Coaster PT, DPT 10:38 AM,09/03/23

## 2023-09-03 NOTE — Discharge Summary (Signed)
Physician Discharge Summary  Emily Landry LKG:[id number] DOB: 05-Sep-1951 DOA: 08/30/2023  PCP: Kandyce Rud, MD  Admit date: 08/30/2023 Discharge date: 09/03/2023  Admitted From: Home Disposition:  Home w home health  Recommendations for Outpatient Follow-up:  Follow up with PCP in 1-2 weeks   Home Health:Yes PT OT RN Equipment/Devices:LUE sling   Discharge Condition:Stable  CODE STATUS:FULL  Diet recommendation: Reg  Brief/Interim Summary:   72 y.o. female with medical history significant for Rheumatoid arthritis, anxiety, lumbar laminectomy with chronic pain on chronic opiates, HTN who presents to the ED following an accidental fall while walking down the stairs.  States the drywall with a handrail at the side of the staircase was removed for elevation and she fell sideways of the staircase from the height of about the third or fourth step from the bottom of the staircase.  Had no loss of consciousness.  She presented with left shoulder pain left rib pain and back pain as well as right ankle pain and neck pain.  She was previously in her usual state of health    9/18: Patient seen in consultation by orthopedics.  Conservative management with sling and immobility recommended.  Patient still in a substantial amount of pain requiring intravenous narcotic use.   9/19: Pain control improved 9/20: Patient pain control improved.  Discussed plan with patient.  Appropriate for discharge.  Will prescribe multimodal pain regimen.  Home health PT OT RN ordered.    Discharge Diagnoses:  Principal Problem:   Multiple left rib fractures, initial encounter Active Problems:   Fall (on) (from) unspecified stairs and steps, initial encounter   Seronegative rheumatoid arthritis (HCC)   Chronic pain syndrome   Chronic, continuous use of opioids   Hypertension   Generalized anxiety disorder   Closed left scapular fracture    * Multiple left rib fractures, initial encounter Left scapular  fracture Accidental fall down stairs, initial encounter Orthopedic consulted.  Nonsurgical management.  Pain control achieved with multimodal pain regimen.  Will discharge on gabapentin, Robaxin, Tylenol, ibuprofen.  As needed narcotics for breakthrough pain.  Seronegative rheumatoid arthritis (HCC) Chronic pain syndrome Continue home methotrexate Continue duloxetine, gabapentin   Hypertension No antihypertensives listed on home medication regimen Follow-up outpatient   Generalized anxiety disorder Continue Cymbalta and Wellbutrin  Discharge Instructions  Discharge Instructions     Diet - low sodium heart healthy   Complete by: As directed    Increase activity slowly   Complete by: As directed       Allergies as of 09/03/2023   No Known Allergies      Medication List     TAKE these medications    acetaminophen 500 MG tablet Commonly known as: TYLENOL Take 1 tablet (500 mg total) by mouth every 6 (six) hours as needed for mild pain, fever or headache.   buPROPion 150 MG 24 hr tablet Commonly known as: WELLBUTRIN XL TAKE THREE TABLETS BY MOUTH ONE TIME DAILY   DULoxetine 60 MG capsule Commonly known as: CYMBALTA Take 1 capsule (60 mg total) by mouth daily.   folic acid 1 MG tablet Commonly known as: FOLVITE Take 1 mg by mouth daily.   gabapentin 400 MG capsule Commonly known as: Neurontin Take 1 capsule (400 mg total) by mouth 3 (three) times daily for 14 days. What changed: when to take this   ibandronate 150 MG tablet Commonly known as: BONIVA Take 150 mg by mouth every 30 (thirty) days.   lidocaine 5 % Commonly known as:  LIDODERM Place 1 patch onto the skin daily. Remove & Discard patch within 12 hours or as directed by MD   methocarbamol 750 MG tablet Commonly known as: ROBAXIN Take 1 tablet (750 mg total) by mouth every 6 (six) hours for 14 days.   methotrexate 2.5 MG tablet Commonly known as: RHEUMATREX Take 2.5 mg by mouth once a week. 4  tablets on Wednesdays   oxyCODONE 5 MG immediate release tablet Commonly known as: Oxy IR/ROXICODONE Take 1-2 tablets (5-10 mg total) by mouth every 4 (four) hours as needed for moderate pain or severe pain.   pantoprazole 20 MG tablet Commonly known as: PROTONIX Take 20 mg by mouth daily.   polyethylene glycol 17 g packet Commonly known as: MIRALAX / GLYCOLAX Take 17 g by mouth daily as needed.   senna-docusate 8.6-50 MG tablet Commonly known as: Senokot-S Take 1 tablet by mouth 2 (two) times daily for 14 days.        No Known Allergies  Consultations: Orthopedics   Procedures/Studies: DG Chest Port 1 View  Result Date: 09/02/2023 CLINICAL DATA:  Shortness of breath.  Multiple left rib fractures. EXAM: PORTABLE CHEST 1 VIEW COMPARISON:  12/31/2022. Chest, abdomen and pelvis CT dated 08/30/2023. FINDINGS: Poor inspiration. The recently demonstrated multiple posterior left rib fractures are not well visualized radiographically with a minimally displaced left 4th posterior rib fracture radiographically evident and unchanged. There is interval visualization of a minimally displaced left 7th posterolateral rib fracture. The comminuted left scapular fracture is not well visualized. Interval small amount of focal airspace opacity in the left lower lung zone with a small amount of linear atelectasis in the left mid to lower lung zone. The remainder of the lungs are clear. Probable minimal left pleural effusion with no pneumothorax seen. IMPRESSION: 1. Poor inspiration with interval visualization of a minimally displaced left 7th posterolateral rib fracture. 2. The other recently demonstrated left rib fractures are not well visualized radiographically. 3. Interval small amount of left lower lung zone atelectasis or pulmonary contusion and a small amount of left mid to lower lung zone atelectasis. 4. Probable minimal left pleural effusion. Electronically Signed   By: Beckie Salts M.D.   On:  09/02/2023 14:31   CT L-SPINE NO CHARGE  Result Date: 08/30/2023 CLINICAL DATA:  Fall injury with back pain, left rib cage and left clavicle fractures and left scapular fracture. EXAM: CT THORACIC AND LUMBAR SPINE WITHOUT CONTRAST TECHNIQUE: Multidetector CT imaging of the thoracic and lumbar spine was performed without intravenous contrast. Multiplanar CT image reconstructions were also generated. RADIATION DOSE REDUCTION: This exam was performed according to the departmental dose-optimization program which includes automated exposure control, adjustment of the mA and/or kV according to patient size and/or use of iterative reconstruction technique. COMPARISON:  MRI thoracic spine 11/02/2020, MRI lumbar spine 04/12/2022. FINDINGS: CT THORACIC SPINE FINDINGS Alignment: There is 3 mm cortical retropulsion in reference to chronic appearing compression fractures of T9 and 11. There is mild thoracic kyphosis and slight thoracic dextroscoliosis. No acute traumatic listhesis or other alignment abnormality is seen. Vertebrae: Generalized osteopenia. There are T9 and 11 bowtie shaped compression fractures both with a chronic appearance new since 2021, with the greatest height loss centrally, at T9 with anterior height loss 50% and posterior height loss 40%, at T11 with anterior height loss 40% and posterior height loss 30%. There are anterior bridging osteophytes from T9 up to T8, marginal endplate spurring of T11 and 3 mm posterior cortical retropulsion at both levels.  There is no cortical fragmentation or step-off is seen at either level. The other vertebrae are normal in heights. There is thoracic spondylosis but no primary pathologic process is seen. Paraspinal and other soft tissues: No paraspinal hematoma is seen. Disc levels: No herniated discs or cord compromise are suspected allowing for lack of intrathecal contrast. There is mild flattening of the ventral thecal sac due to retropulsion at T9 and T11 but no  secondary mass effect is seen. Due to the chronic compression fractures there is moderate bilateral foraminal stenosis at T8-9 and T9-10, and moderate to severe left and moderate right foraminal stenosis at T10-11. The other thoracic spine foramina are clear. Other: There are nondisplaced posterior fractures of the left second, third and fourth ribs and slightly displaced fractures of the posteromedial sixth through ninth left ribs. CT LUMBAR SPINE FINDINGS Segmentation: Standard. Alignment: There is a mild levoscoliosis apex at L4. Otherwise negative lumbar alignment. Vertebrae: There is osteopenia. No acute fracture or primary pathologic process is seen. There are mild-to-moderate chronic compression fractures of L1, L2 and L3. L4 and 5 are normal in heights. These fractures were first seen on the MRI 04/12/2022. There is moderate marginal osteophytosis of the lumbar spine. Spinous process abutment with opposing surface spurring and sclerosis is noted at L2-3 and L3-4, to a lesser extent L4-5. There is reactive sclerosis in the L4 and L5 vertebral bodies which is believed to be due to discogenic degenerative arthrosis but has progressed since 04/12/2022. No endplate destructive changes are seen. Paraspinal and other soft tissues: No paraspinal mass or hematoma. Aortic atherosclerosis. Disc levels: T12-L1: Slight disc space loss.  No herniation or stenosis. L1-2: Normal disc height. There is a mild posterior disc osteophyte complex but no herniation or canal stenosis. Due to facet spurring and foraminal disc bulging there is unilateral moderate to severe left foraminal stenosis. L2-3: There is mild disc space loss with vacuum disc phenomenon. Broad posterior disc osteophyte complex and dorsal ligamentous thickening cause mild-to-moderate spinal canal stenosis. Facet joint spurs and spondylosis causes moderate unilateral left foraminal stenosis. L3-4: There is moderate disc space loss with vacuum phenomenon.  Posterior disc osteophyte complex and dorsal ligamentous thickening in combination causing moderate spinal canal stenosis. Facet spurring causes mild left, moderate to severe right foraminal stenosis. L4-5: There is complete degenerative disc collapse. Circumferential disc osteophyte complex. There is mild spinal canal stenosis, with effaced lateral recesses. Mild-to-moderate facet joint spurring and spondylosis cause moderate to severe right and moderate left foraminal stenosis. L5-S1: This disc is also collapsed. Posterior disc osteophyte complex causes mild-to-moderate spinal stenosis and lateralizes to the right where there is posterior displacement and compression of the right S1 nerve root, slight compression of the left. Facet spurs and spondylosis cause bilateral moderate to severe foraminal stenosis. The visualized SI joints are patent. The visualized sacrum is intact. IMPRESSION: 1. Chronic appearing compression fractures of T9 and T11 with 3 mm posterior cortical retropulsion at both levels but no secondary findings of an acute fracture. 2. Thoracic spine degenerative changes and minimal to mild kyphodextroscoliosis. 3. Multiple left-sided rib fractures. 4. Chronic compression fractures of L1, L2 and L3. 5. Osteopenia, degenerative change and mild lumbar levoscoliosis, without evidence for acute lumbar fracture. 6. Spinous process abutment with opposing surface spurring and sclerosis at L2-3 and L3-4, to a lesser extent L4-5. 7. L5-S1 disc osteophyte complex lateralizes to the right where there is posterior displacement and compression of the right S1 nerve root, slight compression of the left  S1 nerve root. 8. Aortic atherosclerosis and remaining detailed findings described above. Aortic Atherosclerosis (ICD10-I70.0). Electronically Signed   By: Almira Bar M.D.   On: 08/30/2023 22:57   CT T-SPINE NO CHARGE  Result Date: 08/30/2023 CLINICAL DATA:  Fall injury with back pain, left rib cage and left  clavicle fractures and left scapular fracture. EXAM: CT THORACIC AND LUMBAR SPINE WITHOUT CONTRAST TECHNIQUE: Multidetector CT imaging of the thoracic and lumbar spine was performed without intravenous contrast. Multiplanar CT image reconstructions were also generated. RADIATION DOSE REDUCTION: This exam was performed according to the departmental dose-optimization program which includes automated exposure control, adjustment of the mA and/or kV according to patient size and/or use of iterative reconstruction technique. COMPARISON:  MRI thoracic spine 11/02/2020, MRI lumbar spine 04/12/2022. FINDINGS: CT THORACIC SPINE FINDINGS Alignment: There is 3 mm cortical retropulsion in reference to chronic appearing compression fractures of T9 and 11. There is mild thoracic kyphosis and slight thoracic dextroscoliosis. No acute traumatic listhesis or other alignment abnormality is seen. Vertebrae: Generalized osteopenia. There are T9 and 11 bowtie shaped compression fractures both with a chronic appearance new since 2021, with the greatest height loss centrally, at T9 with anterior height loss 50% and posterior height loss 40%, at T11 with anterior height loss 40% and posterior height loss 30%. There are anterior bridging osteophytes from T9 up to T8, marginal endplate spurring of T11 and 3 mm posterior cortical retropulsion at both levels. There is no cortical fragmentation or step-off is seen at either level. The other vertebrae are normal in heights. There is thoracic spondylosis but no primary pathologic process is seen. Paraspinal and other soft tissues: No paraspinal hematoma is seen. Disc levels: No herniated discs or cord compromise are suspected allowing for lack of intrathecal contrast. There is mild flattening of the ventral thecal sac due to retropulsion at T9 and T11 but no secondary mass effect is seen. Due to the chronic compression fractures there is moderate bilateral foraminal stenosis at T8-9 and T9-10,  and moderate to severe left and moderate right foraminal stenosis at T10-11. The other thoracic spine foramina are clear. Other: There are nondisplaced posterior fractures of the left second, third and fourth ribs and slightly displaced fractures of the posteromedial sixth through ninth left ribs. CT LUMBAR SPINE FINDINGS Segmentation: Standard. Alignment: There is a mild levoscoliosis apex at L4. Otherwise negative lumbar alignment. Vertebrae: There is osteopenia. No acute fracture or primary pathologic process is seen. There are mild-to-moderate chronic compression fractures of L1, L2 and L3. L4 and 5 are normal in heights. These fractures were first seen on the MRI 04/12/2022. There is moderate marginal osteophytosis of the lumbar spine. Spinous process abutment with opposing surface spurring and sclerosis is noted at L2-3 and L3-4, to a lesser extent L4-5. There is reactive sclerosis in the L4 and L5 vertebral bodies which is believed to be due to discogenic degenerative arthrosis but has progressed since 04/12/2022. No endplate destructive changes are seen. Paraspinal and other soft tissues: No paraspinal mass or hematoma. Aortic atherosclerosis. Disc levels: T12-L1: Slight disc space loss.  No herniation or stenosis. L1-2: Normal disc height. There is a mild posterior disc osteophyte complex but no herniation or canal stenosis. Due to facet spurring and foraminal disc bulging there is unilateral moderate to severe left foraminal stenosis. L2-3: There is mild disc space loss with vacuum disc phenomenon. Broad posterior disc osteophyte complex and dorsal ligamentous thickening cause mild-to-moderate spinal canal stenosis. Facet joint spurs and spondylosis causes  moderate unilateral left foraminal stenosis. L3-4: There is moderate disc space loss with vacuum phenomenon. Posterior disc osteophyte complex and dorsal ligamentous thickening in combination causing moderate spinal canal stenosis. Facet spurring causes  mild left, moderate to severe right foraminal stenosis. L4-5: There is complete degenerative disc collapse. Circumferential disc osteophyte complex. There is mild spinal canal stenosis, with effaced lateral recesses. Mild-to-moderate facet joint spurring and spondylosis cause moderate to severe right and moderate left foraminal stenosis. L5-S1: This disc is also collapsed. Posterior disc osteophyte complex causes mild-to-moderate spinal stenosis and lateralizes to the right where there is posterior displacement and compression of the right S1 nerve root, slight compression of the left. Facet spurs and spondylosis cause bilateral moderate to severe foraminal stenosis. The visualized SI joints are patent. The visualized sacrum is intact. IMPRESSION: 1. Chronic appearing compression fractures of T9 and T11 with 3 mm posterior cortical retropulsion at both levels but no secondary findings of an acute fracture. 2. Thoracic spine degenerative changes and minimal to mild kyphodextroscoliosis. 3. Multiple left-sided rib fractures. 4. Chronic compression fractures of L1, L2 and L3. 5. Osteopenia, degenerative change and mild lumbar levoscoliosis, without evidence for acute lumbar fracture. 6. Spinous process abutment with opposing surface spurring and sclerosis at L2-3 and L3-4, to a lesser extent L4-5. 7. L5-S1 disc osteophyte complex lateralizes to the right where there is posterior displacement and compression of the right S1 nerve root, slight compression of the left S1 nerve root. 8. Aortic atherosclerosis and remaining detailed findings described above. Aortic Atherosclerosis (ICD10-I70.0). Electronically Signed   By: Almira Bar M.D.   On: 08/30/2023 22:57   CT Head Wo Contrast  Result Date: 08/30/2023 CLINICAL DATA:  The patient fell down 2-3 stair steps, with neck pain, unknown head trauma, left chest and shoulder trauma. EXAM: CT HEAD WITHOUT CONTRAST CT CERVICAL SPINE WITHOUT CONTRAST TECHNIQUE:  Multidetector CT imaging of the head and cervical spine was performed following the standard protocol without intravenous contrast. Multiplanar CT image reconstructions of the cervical spine were also generated. RADIATION DOSE REDUCTION: This exam was performed according to the departmental dose-optimization program which includes automated exposure control, adjustment of the mA and/or kV according to patient size and/or use of iterative reconstruction technique. COMPARISON:  Brain MRI 09/29/2022. No prior cervical spine images were plain films. FINDINGS: CT HEAD FINDINGS Brain: There is mild global atrophy, slight atrophic ventriculomegaly, and mild-to-moderate small-vessel disease of the cerebral white matter. No acute cortical based infarct, hemorrhage, mass or mass effect, or midline shift are seen. There are bilateral calcifications scattered along the falx. Basal cisterns are clear. Vascular: No hyperdense vessel or unexpected calcification. Skull: Negative for fractures or focal lesions. No visible scalp hematoma. Sinuses/Orbits: There is mild membrane thickening in the maxillary and ethmoid sinuses. The sphenoid and frontal sinus, bilateral mastoid air cells and middle ears are clear. The nasal septum is midline. Negative orbits. Other: None. CT CERVICAL SPINE FINDINGS Alignment: There is a straightened cervical lordosis and mild dextroscoliosis, without evidence of listhesis. Narrowing and osteophytes are present of the anterior atlantodental joint with calcified pannus in the posterior joint space. Skull base and vertebrae: There is osteopenia. No acute fracture is evident. No primary bone lesion or focal pathologic process. Soft tissues and spinal canal: No prevertebral fluid or swelling. No visible canal hematoma. There is a 1.3 cm heterogeneous nodule in the right lobe of the thyroid gland. This does not meet size criteria for imaging follow-up (Reference: J Am Coll Radiol. 2015 Feb;12(2):  143-50). No  laryngeal mass is seen. Disc levels: There is near complete degenerative disc space loss C5-6 and C6-7, with small bidirectional osteophytes. Posterior disc osteophyte complex C5-6 flattens the ventral cord surface narrowing the thecal sac to 6.8 mm AP. Posterior disc osteophyte complex at C6-7 mildly deforms the ventral cord surface. Other levels show preservation of normal disc heights. There are posterior disc bulges without significant mass effect at C2-3 and C3-4. Multiple levels demonstrate mild facet joint and uncinate spurring. Foraminal stenosis is noted moderately on the right at C5-6, and mild bilaterally at C6-7 and is not seen at other cervical levels. Upper chest: There are nondisplaced fractures of left posterior second and third ribs, and a partially visible nondisplaced fracture of the proximal shaft of the left clavicle. No apical pneumothorax. Other: None. IMPRESSION: 1. No acute intracranial CT findings or depressed skull fractures. 2. Atrophy and small-vessel disease. 3. Sinus membrane disease. 4. Osteopenia and degenerative change of the cervical spine without evidence of fractures or listhesis. Straightened lordosis with mild dextroscoliosis. 5. Nondisplaced fractures of the left posterior second and third ribs, and partially visible nondisplaced fracture of the proximal shaft of the left clavicle. No apical pneumothorax. 6. 1.3 cm heterogeneous nodule in the right lobe of the thyroid gland. This does not meet size criteria for imaging follow-up. Electronically Signed   By: Almira Bar M.D.   On: 08/30/2023 22:20   CT Cervical Spine Wo Contrast  Result Date: 08/30/2023 CLINICAL DATA:  The patient fell down 2-3 stair steps, with neck pain, unknown head trauma, left chest and shoulder trauma. EXAM: CT HEAD WITHOUT CONTRAST CT CERVICAL SPINE WITHOUT CONTRAST TECHNIQUE: Multidetector CT imaging of the head and cervical spine was performed following the standard protocol without intravenous  contrast. Multiplanar CT image reconstructions of the cervical spine were also generated. RADIATION DOSE REDUCTION: This exam was performed according to the departmental dose-optimization program which includes automated exposure control, adjustment of the mA and/or kV according to patient size and/or use of iterative reconstruction technique. COMPARISON:  Brain MRI 09/29/2022. No prior cervical spine images were plain films. FINDINGS: CT HEAD FINDINGS Brain: There is mild global atrophy, slight atrophic ventriculomegaly, and mild-to-moderate small-vessel disease of the cerebral white matter. No acute cortical based infarct, hemorrhage, mass or mass effect, or midline shift are seen. There are bilateral calcifications scattered along the falx. Basal cisterns are clear. Vascular: No hyperdense vessel or unexpected calcification. Skull: Negative for fractures or focal lesions. No visible scalp hematoma. Sinuses/Orbits: There is mild membrane thickening in the maxillary and ethmoid sinuses. The sphenoid and frontal sinus, bilateral mastoid air cells and middle ears are clear. The nasal septum is midline. Negative orbits. Other: None. CT CERVICAL SPINE FINDINGS Alignment: There is a straightened cervical lordosis and mild dextroscoliosis, without evidence of listhesis. Narrowing and osteophytes are present of the anterior atlantodental joint with calcified pannus in the posterior joint space. Skull base and vertebrae: There is osteopenia. No acute fracture is evident. No primary bone lesion or focal pathologic process. Soft tissues and spinal canal: No prevertebral fluid or swelling. No visible canal hematoma. There is a 1.3 cm heterogeneous nodule in the right lobe of the thyroid gland. This does not meet size criteria for imaging follow-up (Reference: J Am Coll Radiol. 2015 Feb;12(2): 143-50). No laryngeal mass is seen. Disc levels: There is near complete degenerative disc space loss C5-6 and C6-7, with small  bidirectional osteophytes. Posterior disc osteophyte complex C5-6 flattens the ventral cord surface narrowing  the thecal sac to 6.8 mm AP. Posterior disc osteophyte complex at C6-7 mildly deforms the ventral cord surface. Other levels show preservation of normal disc heights. There are posterior disc bulges without significant mass effect at C2-3 and C3-4. Multiple levels demonstrate mild facet joint and uncinate spurring. Foraminal stenosis is noted moderately on the right at C5-6, and mild bilaterally at C6-7 and is not seen at other cervical levels. Upper chest: There are nondisplaced fractures of left posterior second and third ribs, and a partially visible nondisplaced fracture of the proximal shaft of the left clavicle. No apical pneumothorax. Other: None. IMPRESSION: 1. No acute intracranial CT findings or depressed skull fractures. 2. Atrophy and small-vessel disease. 3. Sinus membrane disease. 4. Osteopenia and degenerative change of the cervical spine without evidence of fractures or listhesis. Straightened lordosis with mild dextroscoliosis. 5. Nondisplaced fractures of the left posterior second and third ribs, and partially visible nondisplaced fracture of the proximal shaft of the left clavicle. No apical pneumothorax. 6. 1.3 cm heterogeneous nodule in the right lobe of the thyroid gland. This does not meet size criteria for imaging follow-up. Electronically Signed   By: Almira Bar M.D.   On: 08/30/2023 22:20   CT CHEST ABDOMEN PELVIS W CONTRAST  Result Date: 08/30/2023 CLINICAL DATA:  Larey Seat down 2-3 steps, left rib and shoulder pain EXAM: CT CHEST, ABDOMEN, AND PELVIS WITH CONTRAST TECHNIQUE: Multidetector CT imaging of the chest, abdomen and pelvis was performed following the standard protocol during bolus administration of intravenous contrast. RADIATION DOSE REDUCTION: This exam was performed according to the departmental dose-optimization program which includes automated exposure control,  adjustment of the mA and/or kV according to patient size and/or use of iterative reconstruction technique. CONTRAST:  OMNIPAQUE IOHEXOL 300 MG/ML  SOLN COMPARISON:  12/31/2022 FINDINGS: CT CHEST FINDINGS Cardiovascular: The heart is unremarkable without pericardial effusion. No evidence of thoracic aortic aneurysm or dissection. No evidence of vascular injury. Mediastinum/Nodes: No enlarged mediastinal, hilar, or axillary lymph nodes. Thyroid gland, trachea, and esophagus demonstrate no significant findings. Lungs/Pleura: Trace left pleural fluid. No airspace disease or pneumothorax. Central airways are patent. Musculoskeletal: There are minimally displaced left posterior second, third, and fourth rib fractures. Comminuted minimally displaced fracture extends through the body of the scapula. No other acute bony abnormalities. Chronic appearing T9 and T11 compression fractures are seen, estimated 50% loss of height. Reconstructed images demonstrate no additional findings. CT ABDOMEN PELVIS FINDINGS Hepatobiliary: No focal liver abnormality is seen. Status post cholecystectomy. Expected postsurgical dilatation of the common bile duct. Pancreas: Unremarkable. No pancreatic ductal dilatation or surrounding inflammatory changes. Spleen: No splenic injury or perisplenic hematoma. Adrenals/Urinary Tract: No adrenal hemorrhage or renal injury identified. Bladder is unremarkable. Stomach/Bowel: No bowel obstruction or ileus. Normal appendix right lower quadrant. No bowel wall thickening or inflammatory change. Vascular/Lymphatic: Aortic atherosclerosis. No enlarged abdominal or pelvic lymph nodes. Reproductive: Status post hysterectomy. No adnexal masses. Other: No free fluid or free intraperitoneal gas. No abdominal wall hernia. Musculoskeletal: No acute displaced fractures. Chronic appearing compression deformities involving the superior endplates of L1, L2, and L3. Severe degenerative changes at the L4-5 and L5-S1  level. Reconstructed images demonstrate no additional findings. IMPRESSION: 1. Minimally displaced left posterior second through fourth rib fractures. 2. Comminuted and minimally displaced left scapular fracture. 3. Trace left pleural effusion. 4. No acute intra-abdominal or intrapelvic process. 5.  Aortic Atherosclerosis (ICD10-I70.0). Electronically Signed   By: Sharlet Salina M.D.   On: 08/30/2023 22:07   DG Shoulder  Left  Result Date: 08/30/2023 CLINICAL DATA:  Recent fall with left shoulder pain, initial encounter EXAM: LEFT SHOULDER - 2+ VIEW COMPARISON:  12/31/2022 FINDINGS: Degenerative changes of the acromioclavicular joint are seen. Humeral head is well seated. Irregularity along the tip of the scapula inferiorly is noted which may be related to a mildly displaced fracture. This can be better evaluated on upcoming CT examination. IMPRESSION: Irregularity along the scapular tip which may represent a focal fracture. This was not well visualized on a prior exam from 12/31/2022. This can be evaluated on upcoming CT. Electronically Signed   By: Alcide Clever M.D.   On: 08/30/2023 20:19   DG Ankle Complete Right  Result Date: 08/30/2023 CLINICAL DATA:  Ankle pain following fall, initial encounter EXAM: RIGHT ANKLE - COMPLETE 3+ VIEW COMPARISON:  None Available. FINDINGS: There is no evidence of fracture, dislocation, or joint effusion. There is no evidence of arthropathy or other focal bone abnormality. Soft tissues are unremarkable. IMPRESSION: No acute abnormality noted. Electronically Signed   By: Alcide Clever M.D.   On: 08/30/2023 20:12      Subjective: Seen and examined on the day of discharge.  Stable no distress.  Appropriate for discharge home.  Discharge Exam: Vitals:   09/02/23 2357 09/03/23 0831  BP: 132/73 139/66  Pulse: 77 75  Resp: 18 18  Temp: 98 F (36.7 C) 97.6 F (36.4 C)  SpO2: 95% 92%   Vitals:   09/02/23 0804 09/02/23 1515 09/02/23 2357 09/03/23 0831  BP: (!)  105/56 130/73 132/73 139/66  Pulse: 75 84 77 75  Resp: 20 18 18 18   Temp: 98 F (36.7 C) 98.3 F (36.8 C) 98 F (36.7 C) 97.6 F (36.4 C)  TempSrc: Oral     SpO2:  90% 95% 92%  Weight:      Height:        General: Pt is alert, awake, not in acute distress Cardiovascular: RRR, S1/S2 +, no rubs, no gallops Respiratory: CTA bilaterally, no wheezing, no rhonchi Abdominal: Soft, NT, ND, bowel sounds + Extremities: no edema, no cyanosis    The results of significant diagnostics from this hospitalization (including imaging, microbiology, ancillary and laboratory) are listed below for reference.     Microbiology: No results found for this or any previous visit (from the past 240 hour(s)).   Labs: BNP (last 3 results) No results for input(s): "BNP" in the last 8760 hours. Basic Metabolic Panel: Recent Labs  Lab 08/30/23 1847 08/31/23 0145  NA 138  --   K 3.6  --   CL 101  --   CO2 23  --   GLUCOSE 98  --   BUN 16  --   CREATININE 0.52 0.57  CALCIUM 9.2  --    Liver Function Tests: Recent Labs  Lab 08/30/23 1847  AST 39  ALT 26  ALKPHOS 97  BILITOT 1.0  PROT 7.3  ALBUMIN 4.2   No results for input(s): "LIPASE", "AMYLASE" in the last 168 hours. No results for input(s): "AMMONIA" in the last 168 hours. CBC: Recent Labs  Lab 08/30/23 1847 08/31/23 0145  WBC 12.0* 9.4  NEUTROABS 9.4*  --   HGB 14.7 14.3  HCT 44.0 43.3  MCV 90.3 91.7  PLT 241 216   Cardiac Enzymes: No results for input(s): "CKTOTAL", "CKMB", "CKMBINDEX", "TROPONINI" in the last 168 hours. BNP: Invalid input(s): "POCBNP" CBG: No results for input(s): "GLUCAP" in the last 168 hours. D-Dimer No results for input(s): "DDIMER" in  the last 72 hours. Hgb A1c No results for input(s): "HGBA1C" in the last 72 hours. Lipid Profile No results for input(s): "CHOL", "HDL", "LDLCALC", "TRIG", "CHOLHDL", "LDLDIRECT" in the last 72 hours. Thyroid function studies No results for input(s): "TSH",  "T4TOTAL", "T3FREE", "THYROIDAB" in the last 72 hours.  Invalid input(s): "FREET3" Anemia work up No results for input(s): "VITAMINB12", "FOLATE", "FERRITIN", "TIBC", "IRON", "RETICCTPCT" in the last 72 hours. Urinalysis    Component Value Date/Time   COLORURINE YELLOW (A) 05/14/2022 1512   APPEARANCEUR CLEAR (A) 05/14/2022 1512   LABSPEC 1.012 05/14/2022 1512   PHURINE 6.0 05/14/2022 1512   GLUCOSEU NEGATIVE 05/14/2022 1512   HGBUR NEGATIVE 05/14/2022 1512   BILIRUBINUR NEGATIVE 05/14/2022 1512   KETONESUR NEGATIVE 05/14/2022 1512   PROTEINUR NEGATIVE 05/14/2022 1512   NITRITE NEGATIVE 05/14/2022 1512   LEUKOCYTESUR NEGATIVE 05/14/2022 1512   Sepsis Labs Recent Labs  Lab 08/30/23 1847 08/31/23 0145  WBC 12.0* 9.4   Microbiology No results found for this or any previous visit (from the past 240 hour(s)).   Time coordinating discharge: Over 30 minutes  SIGNED:   Tresa Moore, MD  [facility] 09/03/2023, 11:24 AM Pager   If 7PM-7AM, please contact night-coverage

## 2023-09-03 NOTE — Care Management Important Message (Signed)
Important Message  Patient Details  Name: Emily Landry MRN: [id number] Date of Birth: 06-27-1951   Medicare Important Message Given:  Yes     Olegario Messier A Logyn Kendrick 09/03/2023, 10:27 AM

## 2023-09-06 DIAGNOSIS — I700 Atherosclerosis of aorta: Secondary | ICD-10-CM | POA: Diagnosis not present

## 2023-09-06 DIAGNOSIS — S32038D Other fracture of third lumbar vertebra, subsequent encounter for fracture with routine healing: Secondary | ICD-10-CM | POA: Diagnosis not present

## 2023-09-06 DIAGNOSIS — S2242XD Multiple fractures of ribs, left side, subsequent encounter for fracture with routine healing: Secondary | ICD-10-CM | POA: Diagnosis not present

## 2023-09-06 DIAGNOSIS — S32028D Other fracture of second lumbar vertebra, subsequent encounter for fracture with routine healing: Secondary | ICD-10-CM | POA: Diagnosis not present

## 2023-09-06 DIAGNOSIS — S22078D Other fracture of T9-T10 vertebra, subsequent encounter for fracture with routine healing: Secondary | ICD-10-CM | POA: Diagnosis not present

## 2023-09-06 DIAGNOSIS — S32018D Other fracture of first lumbar vertebra, subsequent encounter for fracture with routine healing: Secondary | ICD-10-CM | POA: Diagnosis not present

## 2023-09-06 DIAGNOSIS — S32048D Other fracture of fourth lumbar vertebra, subsequent encounter for fracture with routine healing: Secondary | ICD-10-CM | POA: Diagnosis not present

## 2023-09-06 DIAGNOSIS — S22088D Other fracture of T11-T12 vertebra, subsequent encounter for fracture with routine healing: Secondary | ICD-10-CM | POA: Diagnosis not present

## 2023-09-06 DIAGNOSIS — M0600 Rheumatoid arthritis without rheumatoid factor, unspecified site: Secondary | ICD-10-CM | POA: Diagnosis not present

## 2023-09-10 DIAGNOSIS — S2242XA Multiple fractures of ribs, left side, initial encounter for closed fracture: Secondary | ICD-10-CM | POA: Diagnosis not present

## 2023-09-10 DIAGNOSIS — S42032A Displaced fracture of lateral end of left clavicle, initial encounter for closed fracture: Secondary | ICD-10-CM | POA: Diagnosis not present

## 2023-09-10 DIAGNOSIS — S42112A Displaced fracture of body of scapula, left shoulder, initial encounter for closed fracture: Secondary | ICD-10-CM | POA: Diagnosis not present

## 2023-09-10 DIAGNOSIS — G8929 Other chronic pain: Secondary | ICD-10-CM | POA: Diagnosis not present

## 2023-09-10 DIAGNOSIS — M25512 Pain in left shoulder: Secondary | ICD-10-CM | POA: Diagnosis not present

## 2023-09-13 DIAGNOSIS — S22088D Other fracture of T11-T12 vertebra, subsequent encounter for fracture with routine healing: Secondary | ICD-10-CM | POA: Diagnosis not present

## 2023-09-13 DIAGNOSIS — S32038D Other fracture of third lumbar vertebra, subsequent encounter for fracture with routine healing: Secondary | ICD-10-CM | POA: Diagnosis not present

## 2023-09-13 DIAGNOSIS — S32048D Other fracture of fourth lumbar vertebra, subsequent encounter for fracture with routine healing: Secondary | ICD-10-CM | POA: Diagnosis not present

## 2023-09-13 DIAGNOSIS — S32018D Other fracture of first lumbar vertebra, subsequent encounter for fracture with routine healing: Secondary | ICD-10-CM | POA: Diagnosis not present

## 2023-09-13 DIAGNOSIS — S32028D Other fracture of second lumbar vertebra, subsequent encounter for fracture with routine healing: Secondary | ICD-10-CM | POA: Diagnosis not present

## 2023-09-13 DIAGNOSIS — M0600 Rheumatoid arthritis without rheumatoid factor, unspecified site: Secondary | ICD-10-CM | POA: Diagnosis not present

## 2023-09-13 DIAGNOSIS — S2242XD Multiple fractures of ribs, left side, subsequent encounter for fracture with routine healing: Secondary | ICD-10-CM | POA: Diagnosis not present

## 2023-09-13 DIAGNOSIS — S22078D Other fracture of T9-T10 vertebra, subsequent encounter for fracture with routine healing: Secondary | ICD-10-CM | POA: Diagnosis not present

## 2023-09-13 DIAGNOSIS — I700 Atherosclerosis of aorta: Secondary | ICD-10-CM | POA: Diagnosis not present

## 2023-09-21 DIAGNOSIS — S2242XD Multiple fractures of ribs, left side, subsequent encounter for fracture with routine healing: Secondary | ICD-10-CM | POA: Diagnosis not present

## 2023-09-21 DIAGNOSIS — I700 Atherosclerosis of aorta: Secondary | ICD-10-CM | POA: Diagnosis not present

## 2023-09-21 DIAGNOSIS — S22078D Other fracture of T9-T10 vertebra, subsequent encounter for fracture with routine healing: Secondary | ICD-10-CM | POA: Diagnosis not present

## 2023-09-21 DIAGNOSIS — S32028D Other fracture of second lumbar vertebra, subsequent encounter for fracture with routine healing: Secondary | ICD-10-CM | POA: Diagnosis not present

## 2023-09-21 DIAGNOSIS — S32018D Other fracture of first lumbar vertebra, subsequent encounter for fracture with routine healing: Secondary | ICD-10-CM | POA: Diagnosis not present

## 2023-09-21 DIAGNOSIS — S32038D Other fracture of third lumbar vertebra, subsequent encounter for fracture with routine healing: Secondary | ICD-10-CM | POA: Diagnosis not present

## 2023-09-21 DIAGNOSIS — M0600 Rheumatoid arthritis without rheumatoid factor, unspecified site: Secondary | ICD-10-CM | POA: Diagnosis not present

## 2023-09-21 DIAGNOSIS — S32048D Other fracture of fourth lumbar vertebra, subsequent encounter for fracture with routine healing: Secondary | ICD-10-CM | POA: Diagnosis not present

## 2023-09-21 DIAGNOSIS — S22088D Other fracture of T11-T12 vertebra, subsequent encounter for fracture with routine healing: Secondary | ICD-10-CM | POA: Diagnosis not present

## 2023-09-23 DIAGNOSIS — S42032A Displaced fracture of lateral end of left clavicle, initial encounter for closed fracture: Secondary | ICD-10-CM | POA: Diagnosis not present

## 2023-09-23 DIAGNOSIS — S2242XA Multiple fractures of ribs, left side, initial encounter for closed fracture: Secondary | ICD-10-CM | POA: Diagnosis not present

## 2023-09-23 DIAGNOSIS — S42112A Displaced fracture of body of scapula, left shoulder, initial encounter for closed fracture: Secondary | ICD-10-CM | POA: Diagnosis not present

## 2023-09-27 DIAGNOSIS — S2242XD Multiple fractures of ribs, left side, subsequent encounter for fracture with routine healing: Secondary | ICD-10-CM | POA: Diagnosis not present

## 2023-09-27 DIAGNOSIS — S42112D Displaced fracture of body of scapula, left shoulder, subsequent encounter for fracture with routine healing: Secondary | ICD-10-CM | POA: Diagnosis not present

## 2023-09-27 DIAGNOSIS — Z79899 Other long term (current) drug therapy: Secondary | ICD-10-CM | POA: Diagnosis not present

## 2023-09-27 DIAGNOSIS — S32028D Other fracture of second lumbar vertebra, subsequent encounter for fracture with routine healing: Secondary | ICD-10-CM | POA: Diagnosis not present

## 2023-09-27 DIAGNOSIS — K219 Gastro-esophageal reflux disease without esophagitis: Secondary | ICD-10-CM | POA: Diagnosis not present

## 2023-09-27 DIAGNOSIS — S32018D Other fracture of first lumbar vertebra, subsequent encounter for fracture with routine healing: Secondary | ICD-10-CM | POA: Diagnosis not present

## 2023-09-27 DIAGNOSIS — S42002D Fracture of unspecified part of left clavicle, subsequent encounter for fracture with routine healing: Secondary | ICD-10-CM | POA: Diagnosis not present

## 2023-09-27 DIAGNOSIS — S22078D Other fracture of T9-T10 vertebra, subsequent encounter for fracture with routine healing: Secondary | ICD-10-CM | POA: Diagnosis not present

## 2023-09-27 DIAGNOSIS — F329 Major depressive disorder, single episode, unspecified: Secondary | ICD-10-CM | POA: Diagnosis not present

## 2023-09-27 DIAGNOSIS — M0600 Rheumatoid arthritis without rheumatoid factor, unspecified site: Secondary | ICD-10-CM | POA: Diagnosis not present

## 2023-09-27 DIAGNOSIS — S32038D Other fracture of third lumbar vertebra, subsequent encounter for fracture with routine healing: Secondary | ICD-10-CM | POA: Diagnosis not present

## 2023-09-27 DIAGNOSIS — M069 Rheumatoid arthritis, unspecified: Secondary | ICD-10-CM | POA: Diagnosis not present

## 2023-09-27 DIAGNOSIS — I700 Atherosclerosis of aorta: Secondary | ICD-10-CM | POA: Diagnosis not present

## 2023-09-27 DIAGNOSIS — S22088D Other fracture of T11-T12 vertebra, subsequent encounter for fracture with routine healing: Secondary | ICD-10-CM | POA: Diagnosis not present

## 2023-09-27 DIAGNOSIS — S32048D Other fracture of fourth lumbar vertebra, subsequent encounter for fracture with routine healing: Secondary | ICD-10-CM | POA: Diagnosis not present

## 2023-10-04 DIAGNOSIS — S2242XD Multiple fractures of ribs, left side, subsequent encounter for fracture with routine healing: Secondary | ICD-10-CM | POA: Diagnosis not present

## 2023-10-04 DIAGNOSIS — I700 Atherosclerosis of aorta: Secondary | ICD-10-CM | POA: Diagnosis not present

## 2023-10-04 DIAGNOSIS — S32038D Other fracture of third lumbar vertebra, subsequent encounter for fracture with routine healing: Secondary | ICD-10-CM | POA: Diagnosis not present

## 2023-10-04 DIAGNOSIS — S32018D Other fracture of first lumbar vertebra, subsequent encounter for fracture with routine healing: Secondary | ICD-10-CM | POA: Diagnosis not present

## 2023-10-04 DIAGNOSIS — S32028D Other fracture of second lumbar vertebra, subsequent encounter for fracture with routine healing: Secondary | ICD-10-CM | POA: Diagnosis not present

## 2023-10-04 DIAGNOSIS — S22088D Other fracture of T11-T12 vertebra, subsequent encounter for fracture with routine healing: Secondary | ICD-10-CM | POA: Diagnosis not present

## 2023-10-04 DIAGNOSIS — S32048D Other fracture of fourth lumbar vertebra, subsequent encounter for fracture with routine healing: Secondary | ICD-10-CM | POA: Diagnosis not present

## 2023-10-04 DIAGNOSIS — S22078D Other fracture of T9-T10 vertebra, subsequent encounter for fracture with routine healing: Secondary | ICD-10-CM | POA: Diagnosis not present

## 2023-10-04 DIAGNOSIS — M0600 Rheumatoid arthritis without rheumatoid factor, unspecified site: Secondary | ICD-10-CM | POA: Diagnosis not present

## 2023-10-06 DIAGNOSIS — S32038D Other fracture of third lumbar vertebra, subsequent encounter for fracture with routine healing: Secondary | ICD-10-CM | POA: Diagnosis not present

## 2023-10-06 DIAGNOSIS — I700 Atherosclerosis of aorta: Secondary | ICD-10-CM | POA: Diagnosis not present

## 2023-10-06 DIAGNOSIS — S2242XD Multiple fractures of ribs, left side, subsequent encounter for fracture with routine healing: Secondary | ICD-10-CM | POA: Diagnosis not present

## 2023-10-06 DIAGNOSIS — S22088D Other fracture of T11-T12 vertebra, subsequent encounter for fracture with routine healing: Secondary | ICD-10-CM | POA: Diagnosis not present

## 2023-10-06 DIAGNOSIS — S32028D Other fracture of second lumbar vertebra, subsequent encounter for fracture with routine healing: Secondary | ICD-10-CM | POA: Diagnosis not present

## 2023-10-06 DIAGNOSIS — S32018D Other fracture of first lumbar vertebra, subsequent encounter for fracture with routine healing: Secondary | ICD-10-CM | POA: Diagnosis not present

## 2023-10-06 DIAGNOSIS — S22078D Other fracture of T9-T10 vertebra, subsequent encounter for fracture with routine healing: Secondary | ICD-10-CM | POA: Diagnosis not present

## 2023-10-06 DIAGNOSIS — M0600 Rheumatoid arthritis without rheumatoid factor, unspecified site: Secondary | ICD-10-CM | POA: Diagnosis not present

## 2023-10-06 DIAGNOSIS — S32048D Other fracture of fourth lumbar vertebra, subsequent encounter for fracture with routine healing: Secondary | ICD-10-CM | POA: Diagnosis not present

## 2023-10-08 DIAGNOSIS — S32028D Other fracture of second lumbar vertebra, subsequent encounter for fracture with routine healing: Secondary | ICD-10-CM | POA: Diagnosis not present

## 2023-10-08 DIAGNOSIS — S2242XD Multiple fractures of ribs, left side, subsequent encounter for fracture with routine healing: Secondary | ICD-10-CM | POA: Diagnosis not present

## 2023-10-08 DIAGNOSIS — S22078D Other fracture of T9-T10 vertebra, subsequent encounter for fracture with routine healing: Secondary | ICD-10-CM | POA: Diagnosis not present

## 2023-10-08 DIAGNOSIS — I700 Atherosclerosis of aorta: Secondary | ICD-10-CM | POA: Diagnosis not present

## 2023-10-08 DIAGNOSIS — M0600 Rheumatoid arthritis without rheumatoid factor, unspecified site: Secondary | ICD-10-CM | POA: Diagnosis not present

## 2023-10-08 DIAGNOSIS — S22088D Other fracture of T11-T12 vertebra, subsequent encounter for fracture with routine healing: Secondary | ICD-10-CM | POA: Diagnosis not present

## 2023-10-08 DIAGNOSIS — S32018D Other fracture of first lumbar vertebra, subsequent encounter for fracture with routine healing: Secondary | ICD-10-CM | POA: Diagnosis not present

## 2023-10-14 DIAGNOSIS — S42112A Displaced fracture of body of scapula, left shoulder, initial encounter for closed fracture: Secondary | ICD-10-CM | POA: Diagnosis not present

## 2023-10-14 DIAGNOSIS — S42032A Displaced fracture of lateral end of left clavicle, initial encounter for closed fracture: Secondary | ICD-10-CM | POA: Diagnosis not present

## 2023-10-14 DIAGNOSIS — S2242XA Multiple fractures of ribs, left side, initial encounter for closed fracture: Secondary | ICD-10-CM | POA: Diagnosis not present

## 2023-10-19 DIAGNOSIS — S22088D Other fracture of T11-T12 vertebra, subsequent encounter for fracture with routine healing: Secondary | ICD-10-CM | POA: Diagnosis not present

## 2023-10-19 DIAGNOSIS — S22078D Other fracture of T9-T10 vertebra, subsequent encounter for fracture with routine healing: Secondary | ICD-10-CM | POA: Diagnosis not present

## 2023-10-19 DIAGNOSIS — S32028D Other fracture of second lumbar vertebra, subsequent encounter for fracture with routine healing: Secondary | ICD-10-CM | POA: Diagnosis not present

## 2023-10-19 DIAGNOSIS — M0600 Rheumatoid arthritis without rheumatoid factor, unspecified site: Secondary | ICD-10-CM | POA: Diagnosis not present

## 2023-10-19 DIAGNOSIS — S2242XD Multiple fractures of ribs, left side, subsequent encounter for fracture with routine healing: Secondary | ICD-10-CM | POA: Diagnosis not present

## 2023-10-19 DIAGNOSIS — I700 Atherosclerosis of aorta: Secondary | ICD-10-CM | POA: Diagnosis not present

## 2023-10-19 DIAGNOSIS — S32038D Other fracture of third lumbar vertebra, subsequent encounter for fracture with routine healing: Secondary | ICD-10-CM | POA: Diagnosis not present

## 2023-10-19 DIAGNOSIS — S32048D Other fracture of fourth lumbar vertebra, subsequent encounter for fracture with routine healing: Secondary | ICD-10-CM | POA: Diagnosis not present

## 2023-10-19 DIAGNOSIS — S32018D Other fracture of first lumbar vertebra, subsequent encounter for fracture with routine healing: Secondary | ICD-10-CM | POA: Diagnosis not present

## 2023-10-20 DIAGNOSIS — S32018D Other fracture of first lumbar vertebra, subsequent encounter for fracture with routine healing: Secondary | ICD-10-CM | POA: Diagnosis not present

## 2023-10-20 DIAGNOSIS — S32038D Other fracture of third lumbar vertebra, subsequent encounter for fracture with routine healing: Secondary | ICD-10-CM | POA: Diagnosis not present

## 2023-10-20 DIAGNOSIS — S32028D Other fracture of second lumbar vertebra, subsequent encounter for fracture with routine healing: Secondary | ICD-10-CM | POA: Diagnosis not present

## 2023-10-20 DIAGNOSIS — M0600 Rheumatoid arthritis without rheumatoid factor, unspecified site: Secondary | ICD-10-CM | POA: Diagnosis not present

## 2023-10-20 DIAGNOSIS — S2242XD Multiple fractures of ribs, left side, subsequent encounter for fracture with routine healing: Secondary | ICD-10-CM | POA: Diagnosis not present

## 2023-10-20 DIAGNOSIS — I700 Atherosclerosis of aorta: Secondary | ICD-10-CM | POA: Diagnosis not present

## 2023-10-20 DIAGNOSIS — S22088D Other fracture of T11-T12 vertebra, subsequent encounter for fracture with routine healing: Secondary | ICD-10-CM | POA: Diagnosis not present

## 2023-10-20 DIAGNOSIS — S22078D Other fracture of T9-T10 vertebra, subsequent encounter for fracture with routine healing: Secondary | ICD-10-CM | POA: Diagnosis not present

## 2023-10-20 DIAGNOSIS — S32048D Other fracture of fourth lumbar vertebra, subsequent encounter for fracture with routine healing: Secondary | ICD-10-CM | POA: Diagnosis not present

## 2023-10-25 DIAGNOSIS — S32028D Other fracture of second lumbar vertebra, subsequent encounter for fracture with routine healing: Secondary | ICD-10-CM | POA: Diagnosis not present

## 2023-10-25 DIAGNOSIS — M0600 Rheumatoid arthritis without rheumatoid factor, unspecified site: Secondary | ICD-10-CM | POA: Diagnosis not present

## 2023-10-25 DIAGNOSIS — S22078D Other fracture of T9-T10 vertebra, subsequent encounter for fracture with routine healing: Secondary | ICD-10-CM | POA: Diagnosis not present

## 2023-10-25 DIAGNOSIS — S32018D Other fracture of first lumbar vertebra, subsequent encounter for fracture with routine healing: Secondary | ICD-10-CM | POA: Diagnosis not present

## 2023-10-25 DIAGNOSIS — S32038D Other fracture of third lumbar vertebra, subsequent encounter for fracture with routine healing: Secondary | ICD-10-CM | POA: Diagnosis not present

## 2023-10-25 DIAGNOSIS — S32048D Other fracture of fourth lumbar vertebra, subsequent encounter for fracture with routine healing: Secondary | ICD-10-CM | POA: Diagnosis not present

## 2023-10-25 DIAGNOSIS — I700 Atherosclerosis of aorta: Secondary | ICD-10-CM | POA: Diagnosis not present

## 2023-10-25 DIAGNOSIS — S2242XD Multiple fractures of ribs, left side, subsequent encounter for fracture with routine healing: Secondary | ICD-10-CM | POA: Diagnosis not present

## 2023-10-25 DIAGNOSIS — S22088D Other fracture of T11-T12 vertebra, subsequent encounter for fracture with routine healing: Secondary | ICD-10-CM | POA: Diagnosis not present

## 2023-11-01 DIAGNOSIS — S22078D Other fracture of T9-T10 vertebra, subsequent encounter for fracture with routine healing: Secondary | ICD-10-CM | POA: Diagnosis not present

## 2023-11-01 DIAGNOSIS — M0600 Rheumatoid arthritis without rheumatoid factor, unspecified site: Secondary | ICD-10-CM | POA: Diagnosis not present

## 2023-11-01 DIAGNOSIS — S32048D Other fracture of fourth lumbar vertebra, subsequent encounter for fracture with routine healing: Secondary | ICD-10-CM | POA: Diagnosis not present

## 2023-11-01 DIAGNOSIS — S32018D Other fracture of first lumbar vertebra, subsequent encounter for fracture with routine healing: Secondary | ICD-10-CM | POA: Diagnosis not present

## 2023-11-01 DIAGNOSIS — S22088D Other fracture of T11-T12 vertebra, subsequent encounter for fracture with routine healing: Secondary | ICD-10-CM | POA: Diagnosis not present

## 2023-11-01 DIAGNOSIS — S32038D Other fracture of third lumbar vertebra, subsequent encounter for fracture with routine healing: Secondary | ICD-10-CM | POA: Diagnosis not present

## 2023-11-01 DIAGNOSIS — S2242XD Multiple fractures of ribs, left side, subsequent encounter for fracture with routine healing: Secondary | ICD-10-CM | POA: Diagnosis not present

## 2023-11-01 DIAGNOSIS — I700 Atherosclerosis of aorta: Secondary | ICD-10-CM | POA: Diagnosis not present

## 2023-11-01 DIAGNOSIS — S32028D Other fracture of second lumbar vertebra, subsequent encounter for fracture with routine healing: Secondary | ICD-10-CM | POA: Diagnosis not present

## 2023-11-04 DIAGNOSIS — S42112A Displaced fracture of body of scapula, left shoulder, initial encounter for closed fracture: Secondary | ICD-10-CM | POA: Diagnosis not present

## 2023-11-04 DIAGNOSIS — M25512 Pain in left shoulder: Secondary | ICD-10-CM | POA: Diagnosis not present

## 2023-11-04 DIAGNOSIS — S42032A Displaced fracture of lateral end of left clavicle, initial encounter for closed fracture: Secondary | ICD-10-CM | POA: Diagnosis not present

## 2023-11-04 DIAGNOSIS — S2242XA Multiple fractures of ribs, left side, initial encounter for closed fracture: Secondary | ICD-10-CM | POA: Diagnosis not present

## 2023-11-22 DIAGNOSIS — M0600 Rheumatoid arthritis without rheumatoid factor, unspecified site: Secondary | ICD-10-CM | POA: Diagnosis not present

## 2023-11-22 DIAGNOSIS — S22088D Other fracture of T11-T12 vertebra, subsequent encounter for fracture with routine healing: Secondary | ICD-10-CM | POA: Diagnosis not present

## 2023-11-22 DIAGNOSIS — I700 Atherosclerosis of aorta: Secondary | ICD-10-CM | POA: Diagnosis not present

## 2023-11-22 DIAGNOSIS — S42032D Displaced fracture of lateral end of left clavicle, subsequent encounter for fracture with routine healing: Secondary | ICD-10-CM | POA: Diagnosis not present

## 2023-11-22 DIAGNOSIS — S22078D Other fracture of T9-T10 vertebra, subsequent encounter for fracture with routine healing: Secondary | ICD-10-CM | POA: Diagnosis not present

## 2023-11-22 DIAGNOSIS — S32018D Other fracture of first lumbar vertebra, subsequent encounter for fracture with routine healing: Secondary | ICD-10-CM | POA: Diagnosis not present

## 2023-11-22 DIAGNOSIS — S2242XD Multiple fractures of ribs, left side, subsequent encounter for fracture with routine healing: Secondary | ICD-10-CM | POA: Diagnosis not present

## 2023-11-22 DIAGNOSIS — S32028D Other fracture of second lumbar vertebra, subsequent encounter for fracture with routine healing: Secondary | ICD-10-CM | POA: Diagnosis not present

## 2023-11-29 DIAGNOSIS — S42032D Displaced fracture of lateral end of left clavicle, subsequent encounter for fracture with routine healing: Secondary | ICD-10-CM | POA: Diagnosis not present

## 2023-12-03 DIAGNOSIS — S42032D Displaced fracture of lateral end of left clavicle, subsequent encounter for fracture with routine healing: Secondary | ICD-10-CM | POA: Diagnosis not present

## 2023-12-06 DIAGNOSIS — S42032D Displaced fracture of lateral end of left clavicle, subsequent encounter for fracture with routine healing: Secondary | ICD-10-CM | POA: Diagnosis not present

## 2023-12-10 DIAGNOSIS — S42032D Displaced fracture of lateral end of left clavicle, subsequent encounter for fracture with routine healing: Secondary | ICD-10-CM | POA: Diagnosis not present

## 2023-12-21 DIAGNOSIS — S42032D Displaced fracture of lateral end of left clavicle, subsequent encounter for fracture with routine healing: Secondary | ICD-10-CM | POA: Diagnosis not present

## 2023-12-23 DIAGNOSIS — M25512 Pain in left shoulder: Secondary | ICD-10-CM | POA: Diagnosis not present

## 2023-12-23 DIAGNOSIS — S42032D Displaced fracture of lateral end of left clavicle, subsequent encounter for fracture with routine healing: Secondary | ICD-10-CM | POA: Diagnosis not present

## 2023-12-23 DIAGNOSIS — S42112A Displaced fracture of body of scapula, left shoulder, initial encounter for closed fracture: Secondary | ICD-10-CM | POA: Diagnosis not present

## 2023-12-23 DIAGNOSIS — S2242XA Multiple fractures of ribs, left side, initial encounter for closed fracture: Secondary | ICD-10-CM | POA: Diagnosis not present

## 2023-12-23 DIAGNOSIS — G8929 Other chronic pain: Secondary | ICD-10-CM | POA: Diagnosis not present

## 2023-12-23 DIAGNOSIS — S42032A Displaced fracture of lateral end of left clavicle, initial encounter for closed fracture: Secondary | ICD-10-CM | POA: Diagnosis not present

## 2023-12-27 DIAGNOSIS — S42032D Displaced fracture of lateral end of left clavicle, subsequent encounter for fracture with routine healing: Secondary | ICD-10-CM | POA: Diagnosis not present

## 2023-12-29 DIAGNOSIS — S42032D Displaced fracture of lateral end of left clavicle, subsequent encounter for fracture with routine healing: Secondary | ICD-10-CM | POA: Diagnosis not present

## 2024-01-06 DIAGNOSIS — M8580 Other specified disorders of bone density and structure, unspecified site: Secondary | ICD-10-CM | POA: Diagnosis not present

## 2024-01-06 DIAGNOSIS — M4850XS Collapsed vertebra, not elsewhere classified, site unspecified, sequela of fracture: Secondary | ICD-10-CM | POA: Diagnosis not present

## 2024-01-12 DIAGNOSIS — M546 Pain in thoracic spine: Secondary | ICD-10-CM | POA: Diagnosis not present

## 2024-01-12 DIAGNOSIS — M9902 Segmental and somatic dysfunction of thoracic region: Secondary | ICD-10-CM | POA: Diagnosis not present

## 2024-01-12 DIAGNOSIS — M542 Cervicalgia: Secondary | ICD-10-CM | POA: Diagnosis not present

## 2024-01-12 DIAGNOSIS — M9901 Segmental and somatic dysfunction of cervical region: Secondary | ICD-10-CM | POA: Diagnosis not present

## 2024-01-14 DIAGNOSIS — M9902 Segmental and somatic dysfunction of thoracic region: Secondary | ICD-10-CM | POA: Diagnosis not present

## 2024-01-14 DIAGNOSIS — M9901 Segmental and somatic dysfunction of cervical region: Secondary | ICD-10-CM | POA: Diagnosis not present

## 2024-01-14 DIAGNOSIS — M546 Pain in thoracic spine: Secondary | ICD-10-CM | POA: Diagnosis not present

## 2024-01-14 DIAGNOSIS — M542 Cervicalgia: Secondary | ICD-10-CM | POA: Diagnosis not present

## 2024-01-17 DIAGNOSIS — M546 Pain in thoracic spine: Secondary | ICD-10-CM | POA: Diagnosis not present

## 2024-01-17 DIAGNOSIS — M9901 Segmental and somatic dysfunction of cervical region: Secondary | ICD-10-CM | POA: Diagnosis not present

## 2024-01-17 DIAGNOSIS — M9902 Segmental and somatic dysfunction of thoracic region: Secondary | ICD-10-CM | POA: Diagnosis not present

## 2024-01-17 DIAGNOSIS — M542 Cervicalgia: Secondary | ICD-10-CM | POA: Diagnosis not present

## 2024-01-19 DIAGNOSIS — Z79899 Other long term (current) drug therapy: Secondary | ICD-10-CM | POA: Diagnosis not present

## 2024-01-19 DIAGNOSIS — M19042 Primary osteoarthritis, left hand: Secondary | ICD-10-CM | POA: Diagnosis not present

## 2024-01-19 DIAGNOSIS — M19041 Primary osteoarthritis, right hand: Secondary | ICD-10-CM | POA: Diagnosis not present

## 2024-01-19 DIAGNOSIS — M0609 Rheumatoid arthritis without rheumatoid factor, multiple sites: Secondary | ICD-10-CM | POA: Diagnosis not present

## 2024-01-21 DIAGNOSIS — M542 Cervicalgia: Secondary | ICD-10-CM | POA: Diagnosis not present

## 2024-01-21 DIAGNOSIS — M9901 Segmental and somatic dysfunction of cervical region: Secondary | ICD-10-CM | POA: Diagnosis not present

## 2024-01-21 DIAGNOSIS — M546 Pain in thoracic spine: Secondary | ICD-10-CM | POA: Diagnosis not present

## 2024-01-21 DIAGNOSIS — M9902 Segmental and somatic dysfunction of thoracic region: Secondary | ICD-10-CM | POA: Diagnosis not present

## 2024-01-26 DIAGNOSIS — M546 Pain in thoracic spine: Secondary | ICD-10-CM | POA: Diagnosis not present

## 2024-01-26 DIAGNOSIS — M542 Cervicalgia: Secondary | ICD-10-CM | POA: Diagnosis not present

## 2024-01-26 DIAGNOSIS — M9902 Segmental and somatic dysfunction of thoracic region: Secondary | ICD-10-CM | POA: Diagnosis not present

## 2024-01-26 DIAGNOSIS — M9901 Segmental and somatic dysfunction of cervical region: Secondary | ICD-10-CM | POA: Diagnosis not present

## 2024-01-31 DIAGNOSIS — M546 Pain in thoracic spine: Secondary | ICD-10-CM | POA: Diagnosis not present

## 2024-01-31 DIAGNOSIS — M542 Cervicalgia: Secondary | ICD-10-CM | POA: Diagnosis not present

## 2024-01-31 DIAGNOSIS — M9902 Segmental and somatic dysfunction of thoracic region: Secondary | ICD-10-CM | POA: Diagnosis not present

## 2024-01-31 DIAGNOSIS — M9901 Segmental and somatic dysfunction of cervical region: Secondary | ICD-10-CM | POA: Diagnosis not present

## 2024-02-07 DIAGNOSIS — M546 Pain in thoracic spine: Secondary | ICD-10-CM | POA: Diagnosis not present

## 2024-02-07 DIAGNOSIS — M9902 Segmental and somatic dysfunction of thoracic region: Secondary | ICD-10-CM | POA: Diagnosis not present

## 2024-02-07 DIAGNOSIS — M542 Cervicalgia: Secondary | ICD-10-CM | POA: Diagnosis not present

## 2024-02-07 DIAGNOSIS — M9901 Segmental and somatic dysfunction of cervical region: Secondary | ICD-10-CM | POA: Diagnosis not present

## 2024-02-08 DIAGNOSIS — Z79899 Other long term (current) drug therapy: Secondary | ICD-10-CM | POA: Diagnosis not present

## 2024-02-18 DIAGNOSIS — Z0000 Encounter for general adult medical examination without abnormal findings: Secondary | ICD-10-CM | POA: Diagnosis not present

## 2024-03-10 DIAGNOSIS — M9902 Segmental and somatic dysfunction of thoracic region: Secondary | ICD-10-CM | POA: Diagnosis not present

## 2024-03-10 DIAGNOSIS — M542 Cervicalgia: Secondary | ICD-10-CM | POA: Diagnosis not present

## 2024-03-10 DIAGNOSIS — M9901 Segmental and somatic dysfunction of cervical region: Secondary | ICD-10-CM | POA: Diagnosis not present

## 2024-03-10 DIAGNOSIS — M546 Pain in thoracic spine: Secondary | ICD-10-CM | POA: Diagnosis not present

## 2024-03-24 DIAGNOSIS — M9902 Segmental and somatic dysfunction of thoracic region: Secondary | ICD-10-CM | POA: Diagnosis not present

## 2024-03-24 DIAGNOSIS — M9901 Segmental and somatic dysfunction of cervical region: Secondary | ICD-10-CM | POA: Diagnosis not present

## 2024-03-24 DIAGNOSIS — M542 Cervicalgia: Secondary | ICD-10-CM | POA: Diagnosis not present

## 2024-03-24 DIAGNOSIS — M546 Pain in thoracic spine: Secondary | ICD-10-CM | POA: Diagnosis not present

## 2024-04-06 DIAGNOSIS — R202 Paresthesia of skin: Secondary | ICD-10-CM | POA: Diagnosis not present

## 2024-04-06 DIAGNOSIS — I10 Essential (primary) hypertension: Secondary | ICD-10-CM | POA: Diagnosis not present

## 2024-04-10 DIAGNOSIS — E663 Overweight: Secondary | ICD-10-CM | POA: Diagnosis not present

## 2024-04-10 DIAGNOSIS — G5700 Lesion of sciatic nerve, unspecified lower limb: Secondary | ICD-10-CM | POA: Diagnosis not present

## 2024-04-10 DIAGNOSIS — M810 Age-related osteoporosis without current pathological fracture: Secondary | ICD-10-CM | POA: Diagnosis not present

## 2024-04-10 DIAGNOSIS — E669 Obesity, unspecified: Secondary | ICD-10-CM | POA: Diagnosis not present

## 2024-04-10 DIAGNOSIS — E349 Endocrine disorder, unspecified: Secondary | ICD-10-CM | POA: Diagnosis not present

## 2024-04-10 DIAGNOSIS — F32A Depression, unspecified: Secondary | ICD-10-CM | POA: Diagnosis not present

## 2024-04-10 DIAGNOSIS — E569 Vitamin deficiency, unspecified: Secondary | ICD-10-CM | POA: Diagnosis not present

## 2024-04-10 DIAGNOSIS — M069 Rheumatoid arthritis, unspecified: Secondary | ICD-10-CM | POA: Diagnosis not present

## 2024-05-09 ENCOUNTER — Encounter: Admitting: Family Medicine

## 2024-05-18 DIAGNOSIS — M19041 Primary osteoarthritis, right hand: Secondary | ICD-10-CM | POA: Diagnosis not present

## 2024-05-18 DIAGNOSIS — M19042 Primary osteoarthritis, left hand: Secondary | ICD-10-CM | POA: Diagnosis not present

## 2024-05-18 DIAGNOSIS — M0609 Rheumatoid arthritis without rheumatoid factor, multiple sites: Secondary | ICD-10-CM | POA: Diagnosis not present

## 2024-05-18 DIAGNOSIS — Z79899 Other long term (current) drug therapy: Secondary | ICD-10-CM | POA: Diagnosis not present

## 2024-05-18 DIAGNOSIS — M65331 Trigger finger, right middle finger: Secondary | ICD-10-CM | POA: Diagnosis not present

## 2024-05-23 ENCOUNTER — Ambulatory Visit: Admitting: Family Medicine

## 2024-06-14 ENCOUNTER — Encounter: Payer: Self-pay | Admitting: Family Medicine

## 2024-06-14 ENCOUNTER — Ambulatory Visit: Admitting: Family Medicine

## 2024-06-14 VITALS — BP 124/67 | HR 68 | Resp 14 | Ht 67.0 in | Wt 205.2 lb

## 2024-06-14 DIAGNOSIS — M0600 Rheumatoid arthritis without rheumatoid factor, unspecified site: Secondary | ICD-10-CM | POA: Diagnosis not present

## 2024-06-14 DIAGNOSIS — M810 Age-related osteoporosis without current pathological fracture: Secondary | ICD-10-CM | POA: Diagnosis not present

## 2024-06-14 DIAGNOSIS — K117 Disturbances of salivary secretion: Secondary | ICD-10-CM

## 2024-06-14 DIAGNOSIS — E66811 Obesity, class 1: Secondary | ICD-10-CM

## 2024-06-14 DIAGNOSIS — G43109 Migraine with aura, not intractable, without status migrainosus: Secondary | ICD-10-CM | POA: Diagnosis not present

## 2024-06-14 DIAGNOSIS — Z130 Encounter for screening for diseases of the blood and blood-forming organs and certain disorders involving the immune mechanism: Secondary | ICD-10-CM

## 2024-06-14 DIAGNOSIS — Z1211 Encounter for screening for malignant neoplasm of colon: Secondary | ICD-10-CM

## 2024-06-14 DIAGNOSIS — Z7689 Persons encountering health services in other specified circumstances: Secondary | ICD-10-CM

## 2024-06-14 DIAGNOSIS — F325 Major depressive disorder, single episode, in full remission: Secondary | ICD-10-CM

## 2024-06-14 DIAGNOSIS — M961 Postlaminectomy syndrome, not elsewhere classified: Secondary | ICD-10-CM

## 2024-06-14 DIAGNOSIS — K635 Polyp of colon: Secondary | ICD-10-CM | POA: Diagnosis not present

## 2024-06-14 DIAGNOSIS — R002 Palpitations: Secondary | ICD-10-CM

## 2024-06-14 DIAGNOSIS — G894 Chronic pain syndrome: Secondary | ICD-10-CM | POA: Diagnosis not present

## 2024-06-14 DIAGNOSIS — E7800 Pure hypercholesterolemia, unspecified: Secondary | ICD-10-CM

## 2024-06-14 DIAGNOSIS — E538 Deficiency of other specified B group vitamins: Secondary | ICD-10-CM

## 2024-06-14 DIAGNOSIS — Z1231 Encounter for screening mammogram for malignant neoplasm of breast: Secondary | ICD-10-CM

## 2024-06-14 DIAGNOSIS — T50905A Adverse effect of unspecified drugs, medicaments and biological substances, initial encounter: Secondary | ICD-10-CM

## 2024-06-14 MED ORDER — JOURNAVX 50 MG PO TABS
1.0000 | ORAL_TABLET | Freq: Two times a day (BID) | ORAL | 0 refills | Status: DC | PRN
Start: 2024-06-14 — End: 2024-11-06

## 2024-06-14 NOTE — Patient Instructions (Signed)
 Please call the [facility] [phone]) to schedule a routine screening mammogram.

## 2024-06-14 NOTE — Progress Notes (Signed)
 New patient visit   Patient: Emily Landry   DOB: 04/09/1951   73 y.o. Female  MRN: [id number] Visit Date: 06/14/2024  Today's healthcare provider: LAURAINE LOISE BUOY, DO   Chief Complaint  Patient presents with   Establish Care    Dr. Diedra previous provider Last mammogram 1-2 years Last colonosopy 8-10 years Decline vaccine  Tetanus about 5-6 years    Subjective    Emily Landry is a 73 y.o. female who presents today as a new patient to establish care.   HPI HPI     Establish Care    Additional comments: Dr. Diedra previous provider Last mammogram 1-2 years Last colonosopy 8-10 years Decline vaccine  Tetanus about 5-6 years       Last edited by Wilfred Hargis RAMAN, CMA on 06/14/2024  8:35 AM.      Emily Landry is a 73 year old female who presents for a new patient visit and medication review.  She has a history of high blood pressure with elevated readings in October 2023 and April 2024, possibly related to pain from a fall. She is not currently on any antihypertensive medication.  She has a history of anxiety and previously took duloxetine  and Wellbutrin , which she discontinued due to severe dry mouth. She reports improved cognitive function after stopping these medications.  She experiences chronic pain, particularly in her feet at night, attributed to nerve pain and a pain syndrome related to a past back injury and surgery. She takes gabapentin  for this pain, which she finds helpful, although she wishes she did not have to take it. She also has extreme itching on her back, believed to be nerve-related, and uses various topical treatments, including a liquid applied by her husband and a steroid cream, to manage it.  She has a history of osteoporosis, previously diagnosed with osteopenia in [place] . She has been on Boniva for about a year and a half to two years. She questions if methotrexate , started after moving, might have contributed to her bone density issues.   She has not been taking the methotrexate .  She experiences reflux, particularly when lying down, and has adjusted her bed and eating habits to manage it. She is not currently taking Protonix .  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 These episodes typically last about 15 minutes, though one lasted an hour. The migraines had decreased after moving to [place]  but have recently returned.  She has a history of B12 deficiency, currently managed with a compounded semaglutide injection that includes B12. She reports feeling less exhausted since starting this treatment.  She has a family history of breast cancer in her mother, who was a lifelong smoker. She (the patient) last had a mammogram in January 2024.     Past Medical History:  Diagnosis Date   Anxiety    Arthritis    Chronic back pain    Depression    Elevated blood pressure reading    GERD (gastroesophageal reflux disease)    History of hiatal hernia    Hypertension 03/03/2021   Osteopenia 08/01/2020   2019 scan     PONV (postoperative nausea and vomiting)    x 1 with Hysterectomy   PTSD (post-traumatic stress disorder)    Refusal of blood transfusions as patient is Jehovah's Witness    Past Surgical History:  Procedure Laterality Date   ABDOMINAL HYSTERECTOMY     no longer has  cervix   CHOLECYSTECTOMY     COLONOSCOPY     LAMINECTOMY  1971   LUMBAR DISC SURGERY  1991   LUMBAR LAMINECTOMY/DECOMPRESSION MICRODISCECTOMY N/A 05/20/2022   Procedure: L2-3 AND L4-5 DECOMPRESSION, RIGHT L3-4 AND L5-S1 MICRODISCECTOMIES;  Surgeon: Clois Fret, MD;  Location: [facility] ORS;  Service: Neurosurgery;  Laterality: N/A;   TONSILLECTOMY     Family Status  Relation Name Status   Mother  Deceased   Father  Deceased  No partnership data on file   Family History  Problem Relation Age of Onset   Breast cancer Mother 45   Diabetes Mother     Rheum arthritis Mother    Arthritis Father    Gout Father    Alcohol abuse Father    Social History   Socioeconomic History   Marital status: Married    Spouse name: Velma   Number of children: 4   Years of education: high school   Highest education level: Associate degree: occupational, Scientist, product/process development, or vocational program  Occupational History   Not on file  Tobacco Use   Smoking status: Former    Types: Cigarettes   Smokeless tobacco: Never   Tobacco comments:    Smoked for 2 years as a teenager only  Advertising account planner   Vaping status: Never Used  Substance and Sexual Activity   Alcohol use: Yes    Comment: occ   Drug use: Never   Sexual activity: Not Currently  Other Topics Concern   Not on file  Social History Narrative   07/25/20   From: [place] , moved to be near children   Living: with husband, [place] and oldest daughter   Work: retired       Family: Scientist, research (physical sciences) (daughter she lives), Physicist, medical ([place]), [place] ([place]), [place] ([place]) - 2 grandchildren - one in [place] and one in [place]      Enjoys: bike, fish, Pharmacist, community      Exercise: walking regularly   Diet: healthy - too many sweets      Safety   Seat belts: Yes    Guns: No   Safe in relationships: Yes    Social Drivers of Corporate investment banker Strain: Low Risk  (06/13/2024)   Overall Financial Resource Strain (CARDIA)    Difficulty of Paying Living Expenses: Not very hard  Food Insecurity: No Food Insecurity (06/13/2024)   Hunger Vital Sign    Worried About Running Out of Food in the Last Year: Never true    Ran Out of Food in the Last Year: Never true  Transportation Needs: No Transportation Needs (06/13/2024)   PRAPARE - Administrator, Civil Service (Medical): No    Lack of Transportation (Non-Medical): No  Physical Activity: Insufficiently Active (06/13/2024)   Exercise Vital Sign    Days of Exercise per Week: 2 days    Minutes of Exercise per Session: 30 min  Stress: Stress Concern Present (06/13/2024)   [employer] of Occupational Health - Occupational Stress Questionnaire    Feeling of Stress: Rather much  Social Connections: Moderately Integrated (06/13/2024)   Social Connection and Isolation Panel    Frequency of Communication with Friends and Family: Twice a week    Frequency of Social Gatherings with Friends and Family: Never    Attends Religious Services: More than 4 times per year    Active Member of [employer] or Organizations: Yes    Attends Banker Meetings: More than 4 times per year    Marital  Status: Married   Outpatient Medications Prior to Visit  Medication Sig   acetaminophen  (TYLENOL ) 500 MG tablet Take 1 tablet (500 mg total) by mouth every 6 (six) hours as needed for mild pain, fever or headache.   folic acid  (FOLVITE ) 1 MG tablet Take 1 mg by mouth daily.   gabapentin  (NEURONTIN ) 400 MG capsule Take 1 capsule (400 mg total) by mouth 3 (three) times daily for 14 days.   ibandronate (BONIVA) 150 MG tablet TAKE 1 TABLET (150 MG TOTAL) BY MOUTH EVERY 30 DAYS WITH A FULL GLASS OF WATER. DO NOT LIE DOWN FOR THE NEXT 60 MIN.   Semaglutide-Weight Management 0.5 MG/0.5ML SOAJ Inject 0.5 mg into the skin.   methotrexate  (RHEUMATREX) 2.5 MG tablet Take 2.5 mg by mouth once a week. 4 tablets on Wednesdays (Patient not taking: Reported on 06/14/2024)   polyethylene glycol (MIRALAX  / GLYCOLAX ) 17 g packet Take 17 g by mouth daily as needed.   [DISCONTINUED] buPROPion  (WELLBUTRIN  XL) 150 MG 24 hr tablet TAKE THREE TABLETS BY MOUTH ONE TIME DAILY   [DISCONTINUED] DULoxetine  (CYMBALTA ) 60 MG capsule Take 1 capsule (60 mg total) by mouth daily.   [DISCONTINUED] lidocaine  (LIDODERM ) 5 % Place 1 patch onto the skin daily. Remove & Discard patch within 12 hours or as directed by MD   [DISCONTINUED] oxyCODONE  (OXY IR/ROXICODONE ) 5 MG immediate release tablet Take 1-2 tablets (5-10 mg total) by mouth every 4 (four) hours as needed for moderate pain or severe pain.   [DISCONTINUED]  pantoprazole  (PROTONIX ) 20 MG tablet Take 20 mg by mouth daily.   No facility-administered medications prior to visit.   No Known Allergies  Immunization History  Administered Date(s) Administered   Fluad Trivalent(High Dose 65+) 09/03/2023   Influenza Inj Mdck Quad Pf 10/07/2021   PFIZER(Purple Top)SARS-COV-2 Vaccination 02/12/2020, 02/29/2020, 03/21/2020    Health Maintenance  Topic Date Due   Hepatitis C Screening  Never done   DTaP/Tdap/Td (1 - Tdap) Never done   Zoster Vaccines- Shingrix (1 of 2) Never done   Colonoscopy  Never done   Pneumococcal Vaccine: 50+ Years (1 of 1 - PCV) Never done   MAMMOGRAM  11/05/2021   COVID-19 Vaccine (4 - 2024-25 season) 08/15/2023   INFLUENZA VACCINE  07/14/2024   Medicare Annual Wellness (AWV)  02/17/2025   DEXA SCAN  Completed   Hepatitis B Vaccines  Aged Out   HPV VACCINES  Aged Out   Meningococcal B Vaccine  Aged Out    Patient Care Team: Gianah Batt, Lauraine SAILOR, DO as PCP - General (Family Medicine)       Objective    BP 124/67 (BP Location: Right Arm, Patient Position: Sitting, Cuff Size: Large)   Pulse 68   Resp 14   Ht 5' 7 (1.702 m)   Wt 205 lb 3.2 oz (93.1 kg)   SpO2 96%   BMI 32.14 kg/m     Physical Exam Vitals and nursing note reviewed.  Constitutional:      General: She is not in acute distress.    Appearance: Normal appearance.  HENT:     Head: Normocephalic and atraumatic.  Eyes:     General: No scleral icterus.    Conjunctiva/sclera: Conjunctivae normal.  Cardiovascular:     Rate and Rhythm: Normal rate.  Pulmonary:     Effort: Pulmonary effort is normal.  Neurological:     Mental Status: She is alert and oriented to person, place, and time. Mental status is at baseline.  Psychiatric:        Mood and Affect: Mood normal.        Behavior: Behavior normal.     Depression Screen    06/14/2024    8:43 AM 03/16/2023    8:15 AM 12/24/2022   10:22 AM 12/23/2021    8:07 AM  PHQ 2/9 Scores  PHQ - 2 Score  2 0 0 0  PHQ- 9 Score 9      No results found for any visits on 06/14/24.  Assessment & Plan     Chronic pain syndrome Assessment & Plan: Currently weaned herself off multiple medications due to concern regarding side effects, especially dry mouth. Pain ongoing but patient prefers to avoid medications.  Orders: -     Journavx ; Take 1 tablet by mouth 2 (two) times daily as needed.  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   Major depressive disorder, single episode, in remission (HCC)  Screening for endocrine, metabolic and immunity disorder  Encounter for screening mammogram for breast cancer -     3D Screening Mammogram, Left and Right; Future  Encounter for colorectal cancer screening -     Ambulatory referral to Gastroenterology  Polyp of colon, unspecified part of colon, unspecified type -     Ambulatory referral to Gastroenterology     Chronic Pain Syndrome; lumbar postlaminectomy syndrome; seronegative rheumatoid arthritis Chronic pain in back and feet likely due to post-laminectomy syndrome and previous fractures. Current regimen inadequate. Neuropathic symptoms present. Discussed Jornay PM for pain management, potential side effects, and cost concerns. - Prescribed Jornay PM for pain management. - Provided pharmacy instructions for insurance claim rejection. - Follow up in 6 weeks to assess pain control and medication efficacy.  Ocular Migraines Increased frequency of ocular migraines, five episodes in the last month.  Consider starting abortive therapy.  Obesity (BMI 30.0-34.9) Counseled patient on lifestyle modifications, including maintaining a heart  healthy diet and engaging in regular daily exercise.  Osteoporosis Osteoporosis confirmed by bone density scan, possibly exacerbated by previous methotrexate  use. Managed with Boniva.  Gastroesophageal Reflux Disease (GERD) Chronic, controlled.  Reflux symptoms managed with lifestyle modifications. Previously on Protonix .  Xerostomia Dry mouth potentially exacerbated by gabapentin . Considered pregabalin but has higher risk of dry mouth.  Depression, in remission Noted.  No acute concerns.  Continue to monitor.   General Health Maintenance Routine health maintenance discussed. Due for colonoscopy due to history of polyps and family history of breast cancer. - Ordered mammogram. - Referred to GI for colonoscopy.    Return in about 6 weeks (around 07/26/2024) for Chronic f/u.     I discussed the assessment and treatment plan with the patient  The patient was provided an opportunity to ask questions and all were answered. The patient agreed with the plan and demonstrated an understanding of the instructions.   The patient was advised to call back or seek an in-person evaluation if the symptoms worsen or if the condition fails to improve as anticipated.    LAURAINE LOISE BUOY, DO  [facility] [phone] (phone) [phone] (fax)  [facility]

## 2024-06-14 NOTE — Assessment & Plan Note (Signed)
 Currently weaned herself off multiple medications due to concern regarding side effects, especially dry mouth. Pain ongoing but patient prefers to avoid medications.

## 2024-06-14 NOTE — Assessment & Plan Note (Signed)
 Currently on Boniva; started 1-2 years ago.

## 2024-06-27 DIAGNOSIS — Z1231 Encounter for screening mammogram for malignant neoplasm of breast: Secondary | ICD-10-CM | POA: Diagnosis not present

## 2024-06-27 LAB — HM MAMMOGRAPHY

## 2024-07-04 ENCOUNTER — Encounter: Payer: Self-pay | Admitting: Family Medicine

## 2024-07-10 DIAGNOSIS — H43813 Vitreous degeneration, bilateral: Secondary | ICD-10-CM | POA: Diagnosis not present

## 2024-07-10 DIAGNOSIS — H2513 Age-related nuclear cataract, bilateral: Secondary | ICD-10-CM | POA: Diagnosis not present

## 2024-07-26 ENCOUNTER — Ambulatory Visit: Admitting: Family Medicine

## 2024-07-30 ENCOUNTER — Encounter: Payer: Self-pay | Admitting: Family Medicine

## 2024-07-31 ENCOUNTER — Encounter: Payer: Self-pay | Admitting: Family Medicine

## 2024-08-15 DIAGNOSIS — M1711 Unilateral primary osteoarthritis, right knee: Secondary | ICD-10-CM | POA: Diagnosis not present

## 2024-08-22 ENCOUNTER — Ambulatory Visit: Admitting: Family Medicine

## 2024-08-22 ENCOUNTER — Encounter: Payer: Self-pay | Admitting: Family Medicine

## 2024-08-22 VITALS — BP 132/67 | HR 67 | Ht 67.0 in | Wt 198.0 lb

## 2024-08-22 DIAGNOSIS — Z130 Encounter for screening for diseases of the blood and blood-forming organs and certain disorders involving the immune mechanism: Secondary | ICD-10-CM | POA: Diagnosis not present

## 2024-08-22 DIAGNOSIS — Z1329 Encounter for screening for other suspected endocrine disorder: Secondary | ICD-10-CM | POA: Diagnosis not present

## 2024-08-22 DIAGNOSIS — Z79899 Other long term (current) drug therapy: Secondary | ICD-10-CM

## 2024-08-22 DIAGNOSIS — M0600 Rheumatoid arthritis without rheumatoid factor, unspecified site: Secondary | ICD-10-CM | POA: Diagnosis not present

## 2024-08-22 DIAGNOSIS — E66811 Obesity, class 1: Secondary | ICD-10-CM | POA: Diagnosis not present

## 2024-08-22 DIAGNOSIS — Z713 Dietary counseling and surveillance: Secondary | ICD-10-CM | POA: Diagnosis not present

## 2024-08-22 DIAGNOSIS — R5382 Chronic fatigue, unspecified: Secondary | ICD-10-CM | POA: Diagnosis not present

## 2024-08-22 DIAGNOSIS — E538 Deficiency of other specified B group vitamins: Secondary | ICD-10-CM | POA: Diagnosis not present

## 2024-08-22 DIAGNOSIS — G479 Sleep disorder, unspecified: Secondary | ICD-10-CM | POA: Diagnosis not present

## 2024-08-22 DIAGNOSIS — E782 Mixed hyperlipidemia: Secondary | ICD-10-CM

## 2024-08-22 DIAGNOSIS — G894 Chronic pain syndrome: Secondary | ICD-10-CM | POA: Diagnosis not present

## 2024-08-22 DIAGNOSIS — M25561 Pain in right knee: Secondary | ICD-10-CM

## 2024-08-22 DIAGNOSIS — Z13228 Encounter for screening for other metabolic disorders: Secondary | ICD-10-CM | POA: Diagnosis not present

## 2024-08-22 MED ORDER — TRAZODONE HCL 50 MG PO TABS: 25.0000 mg | ORAL_TABLET | Freq: Every evening | ORAL | 0 refills | Status: DC | PRN

## 2024-08-22 NOTE — Progress Notes (Signed)
 Established patient visit   Patient: Emily Landry   DOB: 10-Feb-1951   73 y.o. Female  MRN: [id number] Visit Date: 08/22/2024  Today's healthcare provider: LAURAINE LOISE BUOY, DO   Chief Complaint  Patient presents with   Fatigue    Discuss Exhaustion and fatigue, this has been going on for years, she would also like to discuss sleep    Subjective    HPI Emily Landry is a 73 year old female with chronic fatigue and rheumatoid arthritis who presents with exhaustion and sleep disturbances.  She has experienced chronic exhaustion and fatigue for over twenty years, with a particularly severe episode lasting nine months in the past. This fatigue necessitates extensive rest during the day, impacting her ability to engage in activities she enjoys. She is currently taking semaglutide, which contains B12, and has a history of low B12 levels, though she is unsure of her current status. Gabapentin , which she has been taking for several years, can cause 'brain zaps' if a dose is missed.  She experiences significant sleep disturbances, including difficulty falling asleep due to racing thoughts and early morning awakenings. She has previously used Ambien  and Xanax, which were effective but are no longer used. Currently, she uses L-theanine and a CBD gummy with melatonin to aid sleep but still experiences early morning awakenings. She has a history of taking duloxetine , bupropion , and Buspar , but discontinued them due to side effects like severe dry mouth. She has tried multiple medications for anxiety and sleep in the past, including trazodone .  She reports recent pain in the back of her knee, initially concerned about blood clots. The pain improves with movement but is severe upon standing after sitting.  Her family history includes diabetes in her mother and brother. She is a former smoker, having quit in 1972. She enjoys outdoor activities like kayaking and fishing but feels unable to participate  due to her fatigue.   ASCVD 14.0%, using lipids from care everywhere      Medications: Outpatient Medications Prior to Visit  Medication Sig   methotrexate  (RHEUMATREX) 2.5 MG tablet Take 2.5 mg by mouth once a week. 4 tablets on Wednesdays   acetaminophen  (TYLENOL ) 500 MG tablet Take 1 tablet (500 mg total) by mouth every 6 (six) hours as needed for mild pain, fever or headache.   folic acid  (FOLVITE ) 1 MG tablet Take 1 mg by mouth daily.   gabapentin  (NEURONTIN ) 400 MG capsule Take 1 capsule (400 mg total) by mouth 3 (three) times daily for 14 days.   ibandronate (BONIVA) 150 MG tablet TAKE 1 TABLET (150 MG TOTAL) BY MOUTH EVERY 30 DAYS WITH A FULL GLASS OF WATER. DO NOT LIE DOWN FOR THE NEXT 60 MIN.   polyethylene glycol (MIRALAX  / GLYCOLAX ) 17 g packet Take 17 g by mouth daily as needed.   Semaglutide-Weight Management 0.5 MG/0.5ML SOAJ Inject 0.5 mg into the skin.   Suzetrigine  (JOURNAVX ) 50 MG TABS Take 1 tablet by mouth 2 (two) times daily as needed.   No facility-administered medications prior to visit.        Objective    BP 132/67 (BP Location: Right Arm, Patient Position: Sitting, Cuff Size: Normal)   Pulse 67   Ht 5' 7 (1.702 m)   Wt 198 lb (89.8 kg)   SpO2 100%   BMI 31.01 kg/m     Physical Exam Vitals and nursing note reviewed.  Constitutional:      General: She is not in acute  distress.    Appearance: Normal appearance.  HENT:     Head: Normocephalic and atraumatic.  Eyes:     General: No scleral icterus.    Conjunctiva/sclera: Conjunctivae normal.  Cardiovascular:     Rate and Rhythm: Normal rate.  Pulmonary:     Effort: Pulmonary effort is normal.  Neurological:     Mental Status: She is alert and oriented to person, place, and time. Mental status is at baseline.  Psychiatric:        Mood and Affect: Mood normal.        Behavior: Behavior normal.      Results for orders placed or performed in visit on 08/22/24  B12 and Folate Panel   Result Value Ref Range   Vitamin B-12 982 232 - 1,245 pg/mL   Folate >20.0 >3.0 ng/mL  VITAMIN D  25 Hydroxy (Vit-D Deficiency, Fractures)  Result Value Ref Range   Vit D, 25-Hydroxy 41.6 30.0 - 100.0 ng/mL  Iron Binding Cap (TIBC)([facility])  Result Value Ref Range   Total Iron Binding Capacity 346 250 - 450 ug/dL   UIBC 728 881 - 630 ug/dL   Iron 75 27 - 860 ug/dL   Iron Saturation 22 15 - 55 %  Ferritin  Result Value Ref Range   Ferritin 133 15 - 150 ng/mL  Comprehensive metabolic panel with GFR  Result Value Ref Range   Glucose 92 70 - 99 mg/dL   BUN 13 8 - 27 mg/dL   Creatinine, Ser 9.05 0.57 - 1.00 mg/dL   eGFR 64 >40 fO/fpw/8.26   BUN/Creatinine Ratio 14 12 - 28   Sodium 141 134 - 144 mmol/L   Potassium 4.7 3.5 - 5.2 mmol/L   Chloride 101 96 - 106 mmol/L   CO2 29 20 - 29 mmol/L   Calcium 9.5 8.7 - 10.3 mg/dL   Total Protein 6.9 6.0 - 8.5 g/dL   Albumin 4.5 3.8 - 4.8 g/dL   Globulin, Total 2.4 1.5 - 4.5 g/dL   Bilirubin Total 0.7 0.0 - 1.2 mg/dL   Alkaline Phosphatase 86 44 - 121 IU/L   AST 25 0 - 40 IU/L   ALT 14 0 - 32 IU/L  Lipid panel  Result Value Ref Range   Cholesterol, Total 222 (H) 100 - 199 mg/dL   Triglycerides 895 0 - 149 mg/dL   HDL 69 >60 mg/dL   VLDL Cholesterol Cal 18 5 - 40 mg/dL   LDL Chol Calc (NIH) 864 (H) 0 - 99 mg/dL   Chol/HDL Ratio 3.2 0.0 - 4.4 ratio  TSH+T4F+T3Free  Result Value Ref Range   TSH 0.709 0.450 - 4.500 uIU/mL   T3, Free 2.6 2.0 - 4.4 pg/mL   Free T4 1.22 0.82 - 1.77 ng/dL  T3, reverse  Result Value Ref Range   Reverse T3, Serum 23.1 9.2 - 24.1 ng/dL    Assessment & Plan    Chronic fatigue -     B12 and Folate Panel -     VITAMIN D  25 Hydroxy (Vit-D Deficiency, Fractures) -     Iron -     Iron and TIBC -     Ferritin -     TSH+T4F+T3Free -     T3, reverse  Sleep disturbance -     traZODone  HCl; Take 0.5-1 tablets (25-50 mg total) by mouth at bedtime as needed for sleep.  Dispense: 30 tablet; Refill:  0  B12 deficiency  Obesity (BMI 30.0-34.9) -     Comprehensive  metabolic panel with GFR  Weight loss counseling, encounter for  Vitamin B12 deficiency -     B12 and Folate Panel  Seronegative rheumatoid arthritis (HCC) -     B12 and Folate Panel  Mixed hyperlipidemia -     Lipid panel  High risk medication use -     B12 and Folate Panel  Chronic pain syndrome  Right knee pain, unspecified chronicity  Screening for endocrine, metabolic and immunity disorder -     Comprehensive metabolic panel with GFR -     [id number] -     T3, reverse     Chronic fatigue Chronic fatigue over a year, possibly worsened by gabapentin , methotrexate , B12 deficiency, and potential iron deficiency. - Check B12, folate, iron, vitamin D , and thyroid  levels including T3 and reverse T3. - Order metabolic panel.  Sleep disturbance Difficulty with sleep initiation and maintenance, frequent awakenings. Considering trazodone  for sleep maintenance. - Prescribe low-dose trazodone  for sleep maintenance. - Monitor for residual fatigue and side effects.  Obesity (BMI 30.0-34.9); weight loss counseling Limited weight loss with semaglutide. Discussed tirzepatide as an alternative, cost is a concern. Emphasized diet and exercise. - Discuss diet and exercise focusing on portion control and heart-healthy diet.  Vitamin B12 deficiency Previously treated with B12 in semaglutide injections. Current B12 status unknown. - Check B12 levels.  Hyperlipidemia 10-year risk of heart attack and stroke at 14%. Discussed cholesterol medication risks versus benefits.  Patient prefers lifestyle modifications.  Emphasized dietary modifications. - Discuss heart-healthy diet, including Mediterranean diet principles. - Encouraged regular exercise, aiming for 30 minutes daily.  Rheumatoid arthritis Managed with methotrexate . Advised against discontinuation due to risk of symptom recurrence.  - Continue  methotrexate . - Monitor for side effects. - Check folate level  Chronic pain syndrome; right knee pain Managed with gabapentin , reports cognitive side effects. Recent leg pain evaluated at urgent care, with no blood clots or Baker's cyst identified. Pain improves with movement. Continue gentle stretching and range of motion exercises.     Return in about 4 weeks (around 09/19/2024) for Chronic f/u, sleep.      I discussed the assessment and treatment plan with the patient  The patient was provided an opportunity to ask questions and all were answered. The patient agreed with the plan and demonstrated an understanding of the instructions.   The patient was advised to call back or seek an in-person evaluation if the symptoms worsen or if the condition fails to improve as anticipated.    LAURAINE LOISE BUOY, DO  [facility] [phone] (phone) [phone] (fax)  [facility]

## 2024-08-22 NOTE — Patient Instructions (Signed)
 Recommended vaccines to obtain at pharmacy: Tdap (for tetanus, diphtheria and pertussis), Shingrix (for shingles)

## 2024-08-25 LAB — COMPREHENSIVE METABOLIC PANEL WITH GFR
ALT: 14 IU/L (ref 0–32)
AST: 25 IU/L (ref 0–40)
Albumin: 4.5 g/dL (ref 3.8–4.8)
Alkaline Phosphatase: 86 IU/L (ref 44–121)
BUN/Creatinine Ratio: 14 (ref 12–28)
BUN: 13 mg/dL (ref 8–27)
Bilirubin Total: 0.7 mg/dL (ref 0.0–1.2)
CO2: 29 mmol/L (ref 20–29)
Calcium: 9.5 mg/dL (ref 8.7–10.3)
Chloride: 101 mmol/L (ref 96–106)
Creatinine, Ser: 0.94 mg/dL (ref 0.57–1.00)
Globulin, Total: 2.4 g/dL (ref 1.5–4.5)
Glucose: 92 mg/dL (ref 70–99)
Potassium: 4.7 mmol/L (ref 3.5–5.2)
Sodium: 141 mmol/L (ref 134–144)
Total Protein: 6.9 g/dL (ref 6.0–8.5)
eGFR: 64 mL/min/1.73 (ref >59)

## 2024-08-25 LAB — T3, REVERSE: Reverse T3, Serum: 23.1 ng/dL (ref 9.2–24.1)

## 2024-08-25 LAB — LIPID PANEL
Chol/HDL Ratio: 3.2 ratio (ref 0.0–4.4)
Cholesterol, Total: 222 mg/dL — ABNORMAL HIGH (ref 100–199)
HDL: 69 mg/dL (ref >39)
LDL Chol Calc (NIH): 135 mg/dL — ABNORMAL HIGH (ref 0–99)
Triglycerides: 104 mg/dL (ref 0–149)
VLDL Cholesterol Cal: 18 mg/dL (ref 5–40)

## 2024-08-25 LAB — VITAMIN D 25 HYDROXY (VIT D DEFICIENCY, FRACTURES): Vit D, 25-Hydroxy: 41.6 ng/mL (ref 30.0–100.0)

## 2024-08-25 LAB — IRON AND TIBC
Iron Saturation: 22 % (ref 15–55)
Iron: 75 ug/dL (ref 27–139)
Total Iron Binding Capacity: 346 ug/dL (ref 250–450)
UIBC: 271 ug/dL (ref 118–369)

## 2024-08-25 LAB — TSH+T4F+T3FREE
Free T4: 1.22 ng/dL (ref 0.82–1.77)
T3, Free: 2.6 pg/mL (ref 2.0–4.4)
TSH: 0.709 u[IU]/mL (ref 0.450–4.500)

## 2024-08-25 LAB — B12 AND FOLATE PANEL
Folate: >20 ng/mL (ref >3.0)
Vitamin B-12: 982 pg/mL (ref 232–1245)

## 2024-08-25 LAB — FERRITIN: Ferritin: 133 ng/mL (ref 15–150)

## 2024-09-01 NOTE — Progress Notes (Signed)
 Referring Physician:  Donzella Lauraine SAILOR, DO [address] [place],  [place] [address]  Primary Physician:  Donzella Lauraine SAILOR, DO  History of Present Illness: [name] has a history of HTN, GERD, RA, osteoporosis, chronic pain syndrome, depression, anxiety, PTSD.   History of lumbar surgery x 3. Last one was L2-L5 decompression with right L3-L4 and right L5-S1 microdiscectomy by Dr. Clois on 05/20/22.   2 month history of constant LBP with right posterior leg pain to her foot (great toe). She is using a cane due to pain. No left leg pain. She has numbness, tingling, and weakness in right leg. Pain is worse with standing, walking, moving.   She also has numbness/tingling from her shoulder blades down into her lower back.   She also has right knee pain that is more posterior- this started after fall about a year ago. History of chronic pain in right knee.   She is on neurontin . She is taking methotrexate .   Tobacco use: Does not smoke.   Bowel/Bladder Dysfunction: urinary urgency. No bowel issues.   Conservative measures:  Physical therapy: nothing recent  Multimodal medical therapy including regular antiinflammatories: neurontin   Injections: no recent epidural steroid injections  Past Surgery:  L2-L5 decompression with right L3-L4 and right L5-S1 microdiscectomy by Dr. Clois on 05/20/22 2 previous lumbar surgeries  Emily Landry has no symptoms of cervical myelopathy.  The symptoms are causing a significant impact on the patient's life.   Review of Systems:  A 10 point review of systems is negative, except for the pertinent positives and negatives detailed in the HPI.  Past Medical History: Past Medical History:  Diagnosis Date   Anxiety    Arthritis    Chronic back pain    Depression    Elevated blood pressure reading    GERD (gastroesophageal reflux disease)    History of hiatal hernia    Hypertension 03/03/2021   Osteopenia 08/01/2020    2019 scan     PONV (postoperative nausea and vomiting)    x 1 with Hysterectomy   PTSD (post-traumatic stress disorder)    Refusal of blood transfusions as patient is Jehovah's Witness     Past Surgical History: Past Surgical History:  Procedure Laterality Date   ABDOMINAL HYSTERECTOMY     no longer has cervix   CHOLECYSTECTOMY     COLONOSCOPY     LAMINECTOMY  1971   LUMBAR DISC SURGERY  1991   LUMBAR LAMINECTOMY/DECOMPRESSION MICRODISCECTOMY N/A 05/20/2022   Procedure: L2-3 AND L4-5 DECOMPRESSION, RIGHT L3-4 AND L5-S1 MICRODISCECTOMIES;  Surgeon: Clois Fret, MD;  Location: [facility] ORS;  Service: Neurosurgery;  Laterality: N/A;   TONSILLECTOMY      Allergies: Allergies as of 09/05/2024   (No Known Allergies)    Medications: Outpatient Encounter Medications as of 09/05/2024  Medication Sig   acetaminophen  (TYLENOL ) 500 MG tablet Take 1 tablet (500 mg total) by mouth every 6 (six) hours as needed for mild pain, fever or headache.   folic acid  (FOLVITE ) 1 MG tablet Take 1 mg by mouth daily.   gabapentin  (NEURONTIN ) 400 MG capsule Take 1 capsule (400 mg total) by mouth 3 (three) times daily for 14 days.   ibandronate (BONIVA) 150 MG tablet TAKE 1 TABLET (150 MG TOTAL) BY MOUTH EVERY 30 DAYS WITH A FULL GLASS OF WATER. DO NOT LIE DOWN FOR THE NEXT 60 MIN.   methotrexate  (RHEUMATREX) 2.5 MG tablet Take 2.5 mg by mouth once a week. 4 tablets  on Wednesdays   polyethylene glycol (MIRALAX  / GLYCOLAX ) 17 g packet Take 17 g by mouth daily as needed.   Semaglutide-Weight Management 0.5 MG/0.5ML SOAJ Inject 0.5 mg into the skin.   Suzetrigine  (JOURNAVX ) 50 MG TABS Take 1 tablet by mouth 2 (two) times daily as needed.   traZODone  (DESYREL ) 50 MG tablet Take 0.5-1 tablets (25-50 mg total) by mouth at bedtime as needed for sleep.   No facility-administered encounter medications on file as of 09/05/2024.    Social History: Social History   Tobacco Use   Smoking status: Former    Types:  Cigarettes   Smokeless tobacco: Never   Tobacco comments:    Smoked for 2 years as a teenager only  [employer]vertising account planner   Vaping status: Never Used  Substance Use Topics   Alcohol use: Yes    Comment: occ   Drug use: Never    Family Medical History: Family History  Problem Relation Age of Onset   Breast cancer Mother 39   Diabetes Mother    Rheum arthritis Mother    Arthritis Father    Gout Father    Alcohol abuse Father     Physical Examination: Vitals:   09/05/24 1032  BP: 126/84    General: Patient is well developed, well nourished, calm, collected, and in no apparent distress. Attention to examination is appropriate.  Respiratory: Patient is breathing without any difficulty.   NEUROLOGICAL:     Awake, alert, oriented to person, place, and time.  Speech is clear and fluent. Fund of knowledge is appropriate.   Cranial Nerves: Pupils equal round and reactive to light.  Facial tone is symmetric.    No posterior lumbar tenderness. Well healed lumbar incision.   No abnormal lesions on exposed skin.   Strength: Side Biceps Triceps Deltoid Interossei Grip Wrist Ext. Wrist Flex.  R 5 5 5 5 5 5 5   L 5 5 5 5 5 5 5    Side Iliopsoas Quads Hamstring PF DF EHL  R 5 5 5 5 5 5   L 5 5 5 5 5 5    Reflexes are 2+ and symmetric at the biceps, brachioradialis, patella and achilles.   Hoffman's is absent.  Clonus is not present.   Bilateral upper and lower extremity sensation is intact to light touch, but diminished left lateral thigh.   No pain with IR/ER of both hips.   She has medial joint tenderness right knee with mild posterior tenderness. Good ROM with mild pain.   She has a limp favoring right leg and ambulates with a cane.   Gait is normal.     Medical Decision Making  Imaging: CT lumbar spine dated 08/30/23:  CT LUMBAR SPINE FINDINGS   Segmentation: Standard.   Alignment: There is a mild levoscoliosis apex at L4. Otherwise negative lumbar alignment.    Vertebrae: There is osteopenia. No acute fracture or primary pathologic process is seen.   There are mild-to-moderate chronic compression fractures of L1, L2 and L3.   L4 and 5 are normal in heights. These fractures were first seen on the MRI 04/12/2022.   There is moderate marginal osteophytosis of the lumbar spine. Spinous process abutment with opposing surface spurring and sclerosis is noted at L2-3 and L3-4, to a lesser extent L4-5.   There is reactive sclerosis in the L4 and L5 vertebral bodies which is believed to be due to discogenic degenerative arthrosis but has progressed since 04/12/2022. No endplate destructive changes are seen.  Paraspinal and other soft tissues: No paraspinal mass or hematoma. Aortic atherosclerosis.   Disc levels:   T12-L1: Slight disc space loss.  No herniation or stenosis.   L1-2: Normal disc height. There is a mild posterior disc osteophyte complex but no herniation or canal stenosis. Due to facet spurring and foraminal disc bulging there is unilateral moderate to severe left foraminal stenosis.   L2-3: There is mild disc space loss with vacuum disc phenomenon. Broad posterior disc osteophyte complex and dorsal ligamentous thickening cause mild-to-moderate spinal canal stenosis. Facet joint spurs and spondylosis causes moderate unilateral left foraminal stenosis.   L3-4: There is moderate disc space loss with vacuum phenomenon. Posterior disc osteophyte complex and dorsal ligamentous thickening in combination causing moderate spinal canal stenosis. Facet spurring causes mild left, moderate to severe right foraminal stenosis.   L4-5: There is complete degenerative disc collapse. Circumferential disc osteophyte complex. There is mild spinal canal stenosis, with effaced lateral recesses. Mild-to-moderate facet joint spurring and spondylosis cause moderate to severe right and moderate left foraminal stenosis.   L5-S1: This disc is also  collapsed. Posterior disc osteophyte complex causes mild-to-moderate spinal stenosis and lateralizes to the right where there is posterior displacement and compression of the right S1 nerve root, slight compression of the left. Facet spurs and spondylosis cause bilateral moderate to severe foraminal stenosis.   The visualized SI joints are patent. The visualized sacrum is intact.   IMPRESSION: 1. Chronic appearing compression fractures of T9 and T11 with 3 mm posterior cortical retropulsion at both levels but no secondary findings of an acute fracture. 2. Thoracic spine degenerative changes and minimal to mild kyphodextroscoliosis. 3. Multiple left-sided rib fractures. 4. Chronic compression fractures of L1, L2 and L3. 5. Osteopenia, degenerative change and mild lumbar levoscoliosis, without evidence for acute lumbar fracture. 6. Spinous process abutment with opposing surface spurring and sclerosis at L2-3 and L3-4, to a lesser extent L4-5. 7. L5-S1 disc osteophyte complex lateralizes to the right where there is posterior displacement and compression of the right S1 nerve root, slight compression of the left S1 nerve root. 8. Aortic atherosclerosis and remaining detailed findings described above.   Aortic Atherosclerosis (ICD10-I70.0).     Electronically Signed   By: Francis Quam M.D.   On: 08/30/2023 22:57      I have personally reviewed the images and agree with the above interpretation.  Assessment and Plan: [name] has a history of lumbar surgery x 3. Last one was L2-L5 decompression with right L3-L4 and right L5-S1 microdiscectomy by Dr. Clois on 05/20/22.   She had fall on 08/30/23- fell 4 feet off stairs. Had imaging done in ED.   Now with 2 month history of constant LBP with right posterior leg pain to her foot (great toe). No left leg pain. She has numbness, tingling, and weakness in right leg.  CT scan from 08/30/23 shows old compression fractures at T9,  T11, L1, L2, and L3. She has diffuse lumbar DDD with multilevel foraminal stenosis that is severe on right L3-S1. Also with spur/disc L5-S1 with mild/moderate central stenosis and displacement of right S1 nerve.   She also has right knee pain that is more posterior- history of chronic right knee pain that got worse after above fall. She has medial joint line tenderness and pain appears to be knee mediated.   Treatment options discussed with patient and following plan made:   - MRI of lumbar spine to further evaluate back and right leg pain.  -  One time limited prescription for oxycodone  for severe pain. Reviewed dosing and side effects. PMP reviewed and is appropriate.  - Referral to ortho [facility]) at [facility] for right knee pain.  - Will schedule phone visit to review MRI results once I get them back.   I spent a total of 35 minutes in face-to-face and non-face-to-face activities related to this patient's care today including review of outside records, review of imaging, review of symptoms, physical exam, discussion of differential diagnosis, discussion of treatment options, and documentation.   Thank you for involving me in the care of this patient.   Glade Boys PA-C Dept. of Neurosurgery

## 2024-09-05 ENCOUNTER — Encounter: Payer: Self-pay | Admitting: Orthopedic Surgery

## 2024-09-05 ENCOUNTER — Telehealth: Payer: Self-pay | Admitting: Orthopedic Surgery

## 2024-09-05 ENCOUNTER — Ambulatory Visit: Admitting: Orthopedic Surgery

## 2024-09-05 VITALS — BP 126/84 | Ht 67.0 in | Wt 198.0 lb

## 2024-09-05 DIAGNOSIS — M5126 Other intervertebral disc displacement, lumbar region: Secondary | ICD-10-CM | POA: Diagnosis not present

## 2024-09-05 DIAGNOSIS — M25561 Pain in right knee: Secondary | ICD-10-CM | POA: Diagnosis not present

## 2024-09-05 DIAGNOSIS — G8929 Other chronic pain: Secondary | ICD-10-CM

## 2024-09-05 DIAGNOSIS — M5416 Radiculopathy, lumbar region: Secondary | ICD-10-CM

## 2024-09-05 DIAGNOSIS — M51362 Other intervertebral disc degeneration, lumbar region with discogenic back pain and lower extremity pain: Secondary | ICD-10-CM | POA: Diagnosis not present

## 2024-09-05 DIAGNOSIS — Z8781 Personal history of (healed) traumatic fracture: Secondary | ICD-10-CM

## 2024-09-05 DIAGNOSIS — M48061 Spinal stenosis, lumbar region without neurogenic claudication: Secondary | ICD-10-CM

## 2024-09-05 MED ORDER — OXYCODONE HCL 5 MG PO TABS: 5.0000 mg | ORAL_TABLET | Freq: Two times a day (BID) | ORAL | 0 refills | Status: DC | PRN

## 2024-09-05 NOTE — Telephone Encounter (Signed)
 I spoke to patient and informed her that the referral was sent to [facility] and I gave her the phone number. She will reach out to get an appointment.

## 2024-09-05 NOTE — Patient Instructions (Signed)
 It was so nice to see you today. Thank you so much for coming in.    You had a CT of your lower back last September, this should old compression fractures, wear and tear (arthritis), and disc at L5-S1 that could be causing right leg pain.   I want to get an MRI of your lower back to look into things further. We will get this approved through your insurance and [facility] will call you to schedule the appointment. Ask about your patient responsibility. You do not need to pay this prior to getting MRI, they can bill you.   [facility] (building with the white pillars) is located off of [address]. The address is [address] [place], [place] [address].    After you have the MRI, it can take 14-28 days for me to get the results back. If I don't have them in 2 weeks, we will call to try to get the results.   Once I have the results, we will call you to schedule a follow up phone visit with me to review them.   I think your right knee pain is from your knee. I want you to see [facility] orth[facility] here at our clinic for your knee. Dr. Gust and his PA Sam are great and will take good care of you. They should call you to schedule an appointment or you can call them at [phone].   I sent a one time only prescription for oxycodone  to take for severe pain. Take only as needed and remember this can make you sleepy and/or constipated.   Please do not hesitate to call if you have any questions or concerns. You can also message me in MyChart.   Glade Boys PA-C [phone]     The physicians and staff at [facility] at [facility] are committed to providing excellent care. You may receive a survey asking for feedback about your experience at our office. We value you your feedback and appreciate you taking the time to to fill it out. The [facility] leadership team is also available to discuss your experience in person, feel free to contact us  [phone].

## 2024-09-05 NOTE — Telephone Encounter (Signed)
 Waddell from [facility] called to see if our office was sending a referral for this patient. She states that the patient contacted their office to schedule. Please advise.

## 2024-09-06 ENCOUNTER — Ambulatory Visit: Payer: Self-pay | Admitting: Family Medicine

## 2024-09-07 ENCOUNTER — Other Ambulatory Visit: Payer: Self-pay

## 2024-09-07 DIAGNOSIS — M25561 Pain in right knee: Secondary | ICD-10-CM

## 2024-09-11 ENCOUNTER — Ambulatory Visit (INDEPENDENT_AMBULATORY_CARE_PROVIDER_SITE_OTHER)

## 2024-09-11 ENCOUNTER — Ambulatory Visit: Admission: RE | Admit: 2024-09-11 | Discharge: 2024-09-11 | Disposition: A | Discharge status: Home / Self Care

## 2024-09-11 ENCOUNTER — Ambulatory Visit
Admission: RE | Admit: 2024-09-11 | Discharge: 2024-09-11 | Disposition: A | Discharge status: Home / Self Care | Source: Ambulatory Visit

## 2024-09-11 DIAGNOSIS — Z748 Other problems related to care provider dependency: Secondary | ICD-10-CM

## 2024-09-11 DIAGNOSIS — M1711 Unilateral primary osteoarthritis, right knee: Secondary | ICD-10-CM

## 2024-09-11 DIAGNOSIS — M25561 Pain in right knee: Secondary | ICD-10-CM | POA: Insufficient documentation

## 2024-09-11 DIAGNOSIS — M25461 Effusion, right knee: Secondary | ICD-10-CM | POA: Diagnosis not present

## 2024-09-11 MED ORDER — TRIAMCINOLONE ACETONIDE 40 MG/ML IJ SUSP
40.0000 mg | INTRAMUSCULAR | Status: AC | PRN
  Administered 2024-09-11: 40 mg via INTRA_ARTICULAR

## 2024-09-11 NOTE — Progress Notes (Signed)
 Orthopaedic Surgery New Patient Visit   History of Present Illness: The patient is a 73 y.o. female seen in clinic for 14 to 15 years of right knee pain.  Patient states initial injury was during a vacation to [facility] in [place] .  Patient was walking across a downed sequoia tree, jumped off flat onto the ground, approximately 4 feet.  Developed severe right knee pain.  Fell/collapsed.  Patient states lasted several months.  Patient did have it checked out several weeks after injury, states she was told was a strain/sprain.  Patient states over the past years has had several additional injuries including a tripping fall over a bed frame that resulted in surgery frontal teeth fracture and cervical spine exacerbation and left-sided fall from stairs with removed handrail that caused left clavicle, left scapula, and left rib fractures.  Patient describes right knee pain as located over the medial aspect with radiation to the posterior of her knee.  No pain at rest.  Describes severe stabbing pain with weightbearing and full flexion.  Reports inability to squat due to instability and pain.  Has had worsening pain over the past 3 weeks with no new injury/trauma.  Has taken over-the-counter Tylenol , previously prescribed NSAIDs and oxycodone , applied ice and heat, applied castor oil, and rested knee with minimal improvement.  Applied an ace wrap that provided no symptom improvement or sense of stability.  Rates pain as a 3 out of 10.  Associated stiffness. Pain most severe at end of day with activity. Denies leg weakness or change in baseline paresthesias/numbness.  Patient seen at [facility] urgent [facility] on 08/15/2024 for right knee pain.  No imaging performed. Diagnosed with knee arthritis. Advised RICE and over-the-counter meds.  Advised to follow-up at [facility] clinic.  Patient discussed her right knee pain with her neurosurgeon Glade Boys, PA-C with [facility] at [facility]  at most recent appointment on 09/05/2024.  Patient managed by neurosurgery for lumbar radiculopathy, including right sided (numbness, tingling and weakness in right leg and right great toe).  Patient with surgical history of laminectomy in 1971, possible microdiscectomy in 1991, and decompression microdiscectomy by Dr. Katrina with [facility] at [facility] on 05/20/2022.  Patient is scheduled for MRI lumbar spine.  Most recent spinal imaging, CT scan from 08/30/2023, shows old compression fractures at T9, T11, L1, L2, and L3.  She has diffuse lumbar degenerative disc disease.  Also with spur/disc L5-S1 with mild/moderate central stenosis and displacement of right S1 nerve.  Patient is also managed by pain management specialist and rheumatologist for chronic pain and rheumatoid arthritis with negative rheumatoid factor, respectively.  Currently on gabapentin  and methotrexate .  Patient recently establish care with a new primary care provider, Dr. Lauraine Buoy, with [facility].  Patient currently being worked up for worsening fatigue, malaise, and exhaustion.  Patient is primary caregiver for husband who was diagnosed 4 years ago with interstitial lung disease and is not doing well/decompensating.  Patient is retired.  Moved 4 years ago from [place] .  Patient was previously interested in jewelry/crafting.  Patient states with no exhaustion/fatigue she has lost interest and now primarily watches television, Star [employer].  Patient with diagnosis of osteoporosis. Currently on Boniva. Most recent DEXA scan on 04/27/22.   Past Medical, Social and Family History: Past Medical History:  Diagnosis Date   Anxiety    Arthritis    Chronic back pain    Depression    Elevated blood pressure reading  GERD (gastroesophageal reflux disease)    History of hiatal hernia    Hypertension 03/03/2021   Osteopenia 08/01/2020   2019 scan     PONV (postoperative nausea and vomiting)     x 1 with Hysterectomy   PTSD (post-traumatic stress disorder)    Refusal of blood transfusions as patient is Jehovah's Witness    Past Surgical History:  Procedure Laterality Date   ABDOMINAL HYSTERECTOMY     no longer has cervix   CHOLECYSTECTOMY     COLONOSCOPY     LAMINECTOMY  1971   LUMBAR DISC SURGERY  1991   LUMBAR LAMINECTOMY/DECOMPRESSION MICRODISCECTOMY N/A 05/20/2022   Procedure: L2-3 AND L4-5 DECOMPRESSION, RIGHT L3-4 AND L5-S1 MICRODISCECTOMIES;  Surgeon: Clois Fret, MD;  Location: [facility] ORS;  Service: Neurosurgery;  Laterality: N/A;   TONSILLECTOMY     No Known Allergies Current Outpatient Medications on File Prior to Visit  Medication Sig Dispense Refill   acetaminophen  (TYLENOL ) 500 MG tablet Take 1 tablet (500 mg total) by mouth every 6 (six) hours as needed for mild pain, fever or headache.     folic acid  (FOLVITE ) 1 MG tablet Take 1 mg by mouth daily.     gabapentin  (NEURONTIN ) 400 MG capsule Take 1 capsule (400 mg total) by mouth 3 (three) times daily for 14 days. 42 capsule 0   ibandronate (BONIVA) 150 MG tablet TAKE 1 TABLET (150 MG TOTAL) BY MOUTH EVERY 30 DAYS WITH A FULL GLASS OF WATER. DO NOT LIE DOWN FOR THE NEXT 60 MIN.     methotrexate  (RHEUMATREX) 2.5 MG tablet Take 2.5 mg by mouth once a week. 4 tablets on Wednesdays     oxyCODONE  (ROXICODONE ) 5 MG immediate release tablet Take 1 tablet (5 mg total) by mouth every 12 (twelve) hours as needed for severe pain (pain score 7-10). 10 tablet 0   polyethylene glycol (MIRALAX  / GLYCOLAX ) 17 g packet Take 17 g by mouth daily as needed. 14 each 0   Semaglutide-Weight Management 0.5 MG/0.5ML SOAJ Inject 0.5 mg into the skin.     Suzetrigine  (JOURNAVX ) 50 MG TABS Take 1 tablet by mouth 2 (two) times daily as needed. 60 tablet 0   traZODone  (DESYREL ) 50 MG tablet Take 0.5-1 tablets (25-50 mg total) by mouth at bedtime as needed for sleep. 30 tablet 0   No current facility-administered medications on file prior  to visit.   Social History   Tobacco Use   Smoking status: Former    Types: Cigarettes   Smokeless tobacco: Never   Tobacco comments:    Smoked for 2 years as a teenager only  Vaping Use   Vaping status: Never Used  Substance Use Topics   Alcohol use: Yes    Comment: occ   Drug use: Never      I have reviewed past medical, surgical, social and family history, medications and allergies as documented in the EMR.  Review of Systems - A ROS was performed including pertinent positives and negatives as documented in the HPI.     Physical Exam:  General/Constitutional: NAD Vascular: No edema, swelling or tenderness, except as noted in detailed exam Integumentary: No impressive skin lesions present, except as noted in detailed exam Neuro/Psych: Normal mood and affect, oriented to person, place and time Musculoskeletal: Normal, except as noted in detailed exam and in HPI   Focused examination:  Patient sitting comfortably on exam table.  Bilateral lower extremities normal to inspection; faint circular area of erythema over medial aspect  of right knee from contact dermatitis from DMSO cream/castor oil. No edema, ecchymosis, or gross deformity.  Moderate tenderness with palpation over medial aspect, medial joint space, and at Pes anserine.  No palpable step-off or crepitus.  Range of motion 0-130 with pain at end range flexion.  5/5 motor strength.  No pain or laxity with varus or valgus stressing.  Negative anterior and posterior drawer testing.  Negative Lachman's test.  Negative McMurray test. Moderate pain elicited with squat, no instability, buckling, or imbalance.  Vascular/Lymphatic: 2+ dorsalis pedis/posterior tibialis pulses, foot warm and well perfused Neurologic: Sensation intact to light touch to Superficial peroneal/Deep peroneal/Tibial/Sural/Saphenous nerves. Subjective decreased sensation of right deep peroneal nerve.  +1 peripheral ankle edema bilaterally.    XR  Right Knee Complete Imaging: X-rays of the Right knee including 4 views obtained today at [facility] were reviewed personally by me.  Per my independent interpretation these images show no acute fractures. Mild to moderate medial joint space narrowing. Chondrocalcinosis seen in both the medial and lateral joint spaces, more severe in the lateral.  No effusion.    Assessment:   Right knee osteoarthritis  Plan:  Patient was seen and examined in office today. We reviewed patient's history, examination, and imaging in detail.   73 year old female with approximately 15 years of right knee pain. Precipitated by jumping knee injury; however, has waxed and waned in severity now for years. Most recently, located primarily over the medial joint space. Exacerbated with activity and weightbearing. Minimal relief with rest, ice/heat, OTC medication, compression. Based on patient's history, exam findings, and radiology findings - believe patient has pain due to mild to moderate right knee arthritis. Reviewed xray and arthritis diagnosis as well as typical disease course/progression. Will treat today with corticosteroid joint injection. Will start course of physical therapy, to include stretching/strengthening of the knee, as well as core strengthening for balance and fall prevention. Patient previously treated by PT with [facility] and requests referral to that facility again.  Also discussed with patient today burden and emotional/physical stress that accompanies being a significant other's primary care giver. Offered referral to behavioral health. Patient reports previous attempts with therapist/counselor and had poor experiences (language barrier due to thick accent, lack of empathy, unhelpful advice). Patient willing to try again.   Patient education material was provided.  All questions, concerns and comments were addressed to the best of my  ability.  Follow-up: 6 weeks, sooner if any worsening or new concerning symptoms   Arlyss GEANNIE Schneider, DO Orthopedic Surgery & Sports Medicine [facility]   Right knee injection Procedure: The risks and benefits of a cortisone injection were discussed and the patient wishes to proceed.  The anterolateral compartment of the right knee was cleansed with alcohol swabs and ChloraPrep.  The skin was flash cooled with Ethyl Chloride, and using sterile technique, a 20 gauge needle was immediately introduced into the joint.  After aspiration revealed a flash of clear yellow tinged joint fluid the injectate which consisted of 1cc of 40 mg/mL of Kenalog and 4cc of 0.5% ropivacaine  easily flowed into the joint.  The needle was withdrawn and pressure was applied for hemostasis.  A bandage was applied.  The patient tolerated the procedure well. Patient instructed to ice the area tonight if sore.  Cortisone Injections You have received a cortisone injection today. This injection consists of a numbing medication and cortisone.  There may be side effects after receiving the injection.  If you are diabetic: Your blood sugar may increase for up to 48 hours.  Check it more often than normal.   General reactions: A cortisone flare reaction. This generally occurs 6-8 hours after receiving the injection.  Ice the area and take Tylenol  for pain.  If the pain lasts longer than 48 hours call the office.   Some patients may experience flushing, increased heart rate, red face or increase in body temperature.  This is rare but can happen.   Over the counter Benadryl, if appropriate, often reduces these symptoms.  For a severe reaction contact your family doctor or go to the emergency room.  If you have any other questions, feel free to ask.   Large Joint Inj: R knee on 09/11/2024 12:39 PM Indications: pain Details: (21 gauge) 1.5 in needle, anterolateral approach Medications: 40 mg triamcinolone  acetonide 40 MG/ML (4cc,  0.5% ropivacaine ) Outcome: tolerated well, no immediate complications Patient was prepped and draped in the usual sterile fashion.

## 2024-09-11 NOTE — Patient Instructions (Signed)
 [facility] [address], [place] [address] [phone]   Thank you for visiting the office today. We appreciate your trust and allowing us  to help you with your orthopedic needs.  Please do not hesitate to call if you have further questions or concerns following your visit with us . If you experience life-threatening symptoms or it cannot wait until normal office hours, please go to the nearest Emergency Department for immediate evaluation.      Cortisone Injection Patient Information  -A cortisone injection has been recommended for you today during your office visit. The injection consists of two medications administered at the same time. The first medication is a numbing medication. This medication will help you to feel better today for a couple hours.  However, this medication will wear off today and your discomfort may return. The second medication is the steroid medication. This medication will usually take a few days for full effect.   -Some individuals, about 1 in 20, can have an increase in their pain after the injection for approximately 24-36 hours. This is called a steroid flare and may be associated with some slight flushing of the face. The best thing to do if this occurs is to ice the area as much as possible and take ibuprofen if you are able.   -Any patients who are currently receiving treatment for problems with their blood sugar should be aware that this medication may cause you to have elevated blood sugar. This will typically occur for 24-36 hours after the injection. We recommend that you check your blood sugar regularly during this time and contact your primary care provider immediately or go to the nearest emergency room if the levels get to high.

## 2024-09-12 ENCOUNTER — Ambulatory Visit
Admission: RE | Admit: 2024-09-12 | Discharge: 2024-09-12 | Disposition: A | Discharge status: Home / Self Care | Source: Ambulatory Visit | Attending: Orthopedic Surgery | Admitting: Orthopedic Surgery

## 2024-09-12 DIAGNOSIS — Z8781 Personal history of (healed) traumatic fracture: Secondary | ICD-10-CM | POA: Insufficient documentation

## 2024-09-12 DIAGNOSIS — M5126 Other intervertebral disc displacement, lumbar region: Secondary | ICD-10-CM | POA: Diagnosis not present

## 2024-09-12 DIAGNOSIS — M4807 Spinal stenosis, lumbosacral region: Secondary | ICD-10-CM | POA: Diagnosis not present

## 2024-09-12 DIAGNOSIS — M25561 Pain in right knee: Secondary | ICD-10-CM | POA: Insufficient documentation

## 2024-09-12 DIAGNOSIS — G8929 Other chronic pain: Secondary | ICD-10-CM | POA: Diagnosis not present

## 2024-09-12 DIAGNOSIS — M5416 Radiculopathy, lumbar region: Secondary | ICD-10-CM | POA: Diagnosis not present

## 2024-09-14 NOTE — Progress Notes (Unsigned)
 Telephone Visit- Progress Note: Referring Physician:  Donzella Lauraine SAILOR, DO [address] [place],  [place] [address]  Primary Physician:  Donzella Lauraine SAILOR, DO  This visit was performed via telephone.  Patient location: home Provider location: working from home  I spent a total of 15 minutes non-face-to-face activities for this visit on the date of this encounter including review of current clinical condition and response to treatment.    Patient has given verbal consent to this telephone visits and we reviewed the limitations of a telephone visit. Patient wishes to proceed.    Chief Complaint:  review imaging  History of Present Illness: Emily Landry is a 73 y.o. female has a history of  HTN, GERD, RA, osteoporosis, chronic pain syndrome, depression, anxiety, PTSD.    History of lumbar surgery x 3. Last one was L2-L5 decompression with right L3-L4 and right L5-S1 microdiscectomy by Dr. Clois on 05/20/22.   Last seen by me on 09/05/24 with constant LBP with right posterior leg pain to her foot.   CT scan from 08/30/23 shows old compression fractures at T9, T11, L1, L2, and L3. She has diffuse lumbar DDD with multilevel foraminal stenosis that is severe on right L3-S1. Also with spur/disc L5-S1 with mild/moderate central stenosis and displacement of right S1 nerve.   She was sent to ortho for right knee pain and is here to review her lumbar MRI.   She saw ortho on 09/11/24 and was given right knee injection. She was also sent to PT for her knee. She has seen good relief of her knee pain with the injection!  She continues with constant LBP with right posterior leg pain to her foot (great toe), she is having some pain in left leg as well. She is using a cane due to pain. She has numbness, tingling, and weakness in right > left leg. Pain is worse with standing, walking, moving. Able to walk much better since knee injection.   She is on neurontin . She is taking  methotrexate .    Tobacco use: Does not smoke.    Bowel/Bladder Dysfunction: urinary urgency. No bowel issues.    Conservative measures:  Physical therapy: nothing recent  Multimodal medical therapy including regular antiinflammatories: neurontin   Injections: no recent epidural steroid injections   Past Surgery:  L2-L5 decompression with right L3-L4 and right L5-S1 microdiscectomy by Dr. Clois on 05/20/22 2 previous lumbar surgeries   Exam: No exam done as this was a telephone encounter.     Imaging: Lumbar MRI dated 09/12/24:  FINDINGS:   BONES AND ALIGNMENT: Dextroconvex lumbar scoliosis with rotatory component. Normal vertebral body heights. Chronic superior endplate compression fractures are present at L1, L2, L3, and L4. The L4 superior endplate compression fracture is new compared to 04/12/2022. Bone marrow signal is unremarkable. Disc desiccation and prominent loss of disc height are noted at L4-L5 and L5-S1. Type 1 degenerative endplate findings are present especially posteriorly at all levels between L3 and S1, and posteriorly at T11-T12, T12-L1, and L1-L2.   SPINAL CORD: Unremarkable appearing conus medullaris terminates at the T12-L1 level.   SOFT TISSUES: No paraspinal mass.   T12-L1: No impingement. Mild disc bulge. Type 1 degenerative endplate findings posteriorly.   L1-L2: Moderate left foraminal stenosis due to disc osteophyte complex and facet arthropathy. Type 1 degenerative endplate findings posteriorly.   L2-L3: Moderate central stenosis and borderline left foraminal stenosis due to disc bulge, facet arthropathy, and short pedicles. Type 1 degenerative endplate findings especially  posteriorly.   L3-L4: Borderline bilateral foraminal stenosis due to disc bulge, facet arthropathy, and intervertebral spurring. Type 1 degenerative endplate findings especially posteriorly.   L4-L5: Moderate to prominent right and moderate left foraminal  stenosis with moderate central stenosis due to disc osteophyte complex and facet arthropathy. Disc desiccation and prominent loss of disc height. Type 1 degenerative endplate findings especially posteriorly.   L5-S1: Moderate to severe right and moderate left foraminal stenosis with moderate Right Subarticular Lateral Recess Stenosis related to disc osteophyte complex, right lateral recess and inferior foraminal disc protrusion, and facet arthropathy. Disc desiccation and prominent loss of disc height. Type 1 degenerative endplate findings especially posteriorly.   IMPRESSION: 1. Interval (although chronic) superior endplate compression fracture at L4 compared to 04/12/22. 2. Chronic superior endplate compression fractures at L1, L2, and L3. The presence of multiple chronic lumbar fractures favors osteoporosis. 3. Moderate to severe right and moderate left foraminal stenosis with moderate right subarticular lateral recess stenosis at L5-S1. 4. Moderate to prominent right and moderate left foraminal stenosis with moderate central stenosis at L4-5. 5. Moderate central stenosis at L2-3. 6. Moderate left foraminal stenosis at L1-2.   Electronically signed by: Ryan Salvage MD 09/13/2024 05:12 PM EDT RP Workstation: [id number]    I have personally reviewed the images and agree with the above interpretation.  Assessment and Plan: Emily Landry has a history of lumbar surgery x 3. Last one was L2-L5 decompression with right L3-L4 and right L5-S1 microdiscectomy by Dr. Clois on 05/20/22.    She continues with constant LBP with right posterior leg pain to her foot (great toe), she is having some pain in left leg as well. She has numbness, tingling, and weakness in right > left leg. Able to walk much better since recent right knee injection.  She has known history of compression fractures at T9, T11, L1, L2, L3, and L4. No acute fractures seen in lumbar spine. She has moderate central  stenosis at L2-L3 and L4-L5, multilevel foraminal stenosis, and DDD L4-S1. She has moderate bilateral foraminal stenosis L4-L5 and moderate severe right with moderate left foraminal stenosis L5-S1.   LBP likely from underlying DDD/spondylosis. Leg pain is likely from L5-S1.    Treatment options discussed with patient and following plan made:   - PT for lumbar spine. Orders to [facility].  - Message to Dr. Gust to send PT orders for her knee to [facility] as well.  - Discussed lumbar injections. She declines for now.  - Follow up with me in 6-8 weeks and prn.   Glade Boys PA-C Neurosurgery

## 2024-09-15 ENCOUNTER — Encounter: Payer: Self-pay | Admitting: Orthopedic Surgery

## 2024-09-15 ENCOUNTER — Ambulatory Visit: Admitting: Orthopedic Surgery

## 2024-09-15 ENCOUNTER — Telehealth: Payer: Self-pay

## 2024-09-15 DIAGNOSIS — M5416 Radiculopathy, lumbar region: Secondary | ICD-10-CM

## 2024-09-15 DIAGNOSIS — M4726 Other spondylosis with radiculopathy, lumbar region: Secondary | ICD-10-CM

## 2024-09-15 DIAGNOSIS — M48061 Spinal stenosis, lumbar region without neurogenic claudication: Secondary | ICD-10-CM

## 2024-09-15 DIAGNOSIS — Z8781 Personal history of (healed) traumatic fracture: Secondary | ICD-10-CM | POA: Diagnosis not present

## 2024-09-15 DIAGNOSIS — M51362 Other intervertebral disc degeneration, lumbar region with discogenic back pain and lower extremity pain: Secondary | ICD-10-CM

## 2024-09-15 DIAGNOSIS — M47816 Spondylosis without myelopathy or radiculopathy, lumbar region: Secondary | ICD-10-CM

## 2024-09-15 NOTE — Telephone Encounter (Signed)
 Referral faxed to [facility] in  as requested

## 2024-09-15 NOTE — Telephone Encounter (Signed)
 Patient want to go to [name] [facility] on [facility] now. Do you mind resending the referral to them please

## 2024-09-19 ENCOUNTER — Ambulatory Visit: Admitting: Family Medicine

## 2024-09-19 ENCOUNTER — Telehealth: Admitting: Family Medicine

## 2024-09-19 ENCOUNTER — Encounter: Payer: Self-pay | Admitting: Family Medicine

## 2024-09-19 DIAGNOSIS — M25561 Pain in right knee: Secondary | ICD-10-CM

## 2024-09-19 DIAGNOSIS — M545 Low back pain, unspecified: Secondary | ICD-10-CM

## 2024-09-19 DIAGNOSIS — G8929 Other chronic pain: Secondary | ICD-10-CM

## 2024-09-19 DIAGNOSIS — G4700 Insomnia, unspecified: Secondary | ICD-10-CM

## 2024-09-19 DIAGNOSIS — R5382 Chronic fatigue, unspecified: Secondary | ICD-10-CM | POA: Diagnosis not present

## 2024-09-19 NOTE — Progress Notes (Signed)
 MyChart Video Visit    Virtual Visit via Video Note   This format is felt to be most appropriate for this patient at this time. Physical exam was limited by quality of the video and audio technology used for the visit.   Patient location: Home Provider location: [facility]  I discussed the limitations of evaluation and management by telemedicine and the availability of in person appointments. The patient expressed understanding and agreed to proceed.  Patient: Emily Landry   DOB: 11-12-1951   73 y.o. Female  MRN: [id number] Visit Date: 09/19/2024  Today's healthcare provider: LAURAINE LOISE BUOY, DO   No chief complaint on file.  Subjective    HPI  Emily Landry is a 73 year old female who presents with sleep disturbances and knee pain.  She has been experiencing ongoing sleep disturbances. Previously prescribed trazodone  was not taken due to concerns about its aftereffects. Instead, she has started taking magnesium  glycinate, which has improved her sleep duration, allowing her to sleep until at least 5 AM instead of waking at 1 or 2 AM.  She experiences persistent fatigue, feeling exhausted by 3 PM, which limits her ability to engage in activities. She attributes some of this fatigue to sleep loss over time.  She has a history of knee pain for which she received an injection that initially provided significant relief, allowing her to walk more comfortably. However, the relief lasted only about five days, and she is now experiencing pain again. She uses a knee brace for support and is awaiting a call from physical therapy for further management.    Medications: Outpatient Medications Prior to Visit  Medication Sig   acetaminophen  (TYLENOL ) 500 MG tablet Take 1 tablet (500 mg total) by mouth every 6 (six) hours as needed for mild pain, fever or headache.   folic acid  (FOLVITE ) 1 MG tablet Take 1 mg by mouth daily.   gabapentin  (NEURONTIN ) 400 MG capsule Take 1  capsule (400 mg total) by mouth 3 (three) times daily for 14 days.   ibandronate (BONIVA) 150 MG tablet TAKE 1 TABLET (150 MG TOTAL) BY MOUTH EVERY 30 DAYS WITH A FULL GLASS OF WATER. DO NOT LIE DOWN FOR THE NEXT 60 MIN.   methotrexate  (RHEUMATREX) 2.5 MG tablet Take 2.5 mg by mouth once a week. 4 tablets on Wednesdays   oxyCODONE  (ROXICODONE ) 5 MG immediate release tablet Take 1 tablet (5 mg total) by mouth every 12 (twelve) hours as needed for severe pain (pain score 7-10).   polyethylene glycol (MIRALAX  / GLYCOLAX ) 17 g packet Take 17 g by mouth daily as needed.   Semaglutide-Weight Management 0.5 MG/0.5ML SOAJ Inject 0.5 mg into the skin.   Suzetrigine  (JOURNAVX ) 50 MG TABS Take 1 tablet by mouth 2 (two) times daily as needed.   [DISCONTINUED] traZODone  (DESYREL ) 50 MG tablet Take 0.5-1 tablets (25-50 mg total) by mouth at bedtime as needed for sleep.   No facility-administered medications prior to visit.        Objective    There were no vitals taken for this visit.     Physical Exam Constitutional:      General: She is not in acute distress.    Appearance: Normal appearance.  HENT:     Head: Normocephalic.  Pulmonary:     Effort: Pulmonary effort is normal. No respiratory distress.  Neurological:     Mental Status: She is alert and oriented to person, place, and time. Mental status is at baseline.  Assessment & Plan    Insomnia, unspecified type  Chronic fatigue  Chronic pain of right knee  Chronic low back pain, unspecified back pain laterality, unspecified whether sciatica present    Insomnia, unspecified type Insomnia previously prescribed trazodone , which the patient opted against taking. Currently using magnesium  glycinate, which has improved sleep maintenance to 5 AM (from previously waking up around 1 or 2 AM).  Chronic fatigue Persistent fatigue likely multifactorial, including medication side effects and previous sleep loss. Improved sleep may  alleviate fatigue.  Chronic right knee pain Chronic right knee pain with temporary relief from recent injection. Using knee brace. Awaiting physical therapy. - Call [facility] to check on the status of the physical therapy referral. - Engage in knee exercises when the brace is off to maintain support muscles.  Chronic low back pain Chronic low back pain with physical therapy referral sent to [facility] by neurosurgery. - Contact [facility] to follow up on the physical therapy referral for low back pain.    Return in about 6 months (around 03/14/2025) for CPE, Chronic f/u.     I discussed the assessment and treatment plan with the patient. The patient was provided an opportunity to ask questions and all were answered. The patient agreed with the plan and demonstrated an understanding of the instructions.   The patient was advised to call back or seek an in-person evaluation if the symptoms worsen or if the condition fails to improve as anticipated.  I provided 7 minutes of virtual-face-to-face time during this encounter.   LAURAINE LOISE BUOY, DO [facility] [phone] (phone) [phone] (fax)  [facility]

## 2024-09-26 DIAGNOSIS — Z79899 Other long term (current) drug therapy: Secondary | ICD-10-CM | POA: Diagnosis not present

## 2024-09-26 DIAGNOSIS — M19042 Primary osteoarthritis, left hand: Secondary | ICD-10-CM | POA: Diagnosis not present

## 2024-09-26 DIAGNOSIS — M0609 Rheumatoid arthritis without rheumatoid factor, multiple sites: Secondary | ICD-10-CM | POA: Diagnosis not present

## 2024-09-26 DIAGNOSIS — M19041 Primary osteoarthritis, right hand: Secondary | ICD-10-CM | POA: Diagnosis not present

## 2024-10-03 ENCOUNTER — Ambulatory Visit (INDEPENDENT_AMBULATORY_CARE_PROVIDER_SITE_OTHER)

## 2024-10-03 DIAGNOSIS — Z0000 Encounter for general adult medical examination without abnormal findings: Secondary | ICD-10-CM | POA: Diagnosis not present

## 2024-10-03 NOTE — Progress Notes (Signed)
 Subjective:   Emily Landry is a 73 y.o. who presents for a Medicare Wellness preventive visit.  As a reminder, Annual Wellness Visits don't include a physical exam, and some assessments may be limited, especially if this visit is performed virtually. We may recommend an in-person follow-up visit with your provider if needed.  Visit Complete: Virtual I connected with  Emily Landry on 10/03/24 by a audio enabled telemedicine application and verified that I am speaking with the correct person using two identifiers.  Patient Location: Home  Provider Location: Office/Clinic  I discussed the limitations of evaluation and management by telemedicine. The patient expressed understanding and agreed to proceed.  Vital Signs: Because this visit was a virtual/telehealth visit, some criteria may be missing or patient reported. Any vitals not documented were not able to be obtained and vitals that have been documented are patient reported.  VideoDeclined- This patient declined Librarian, academic. Therefore the visit was completed with audio only.  Persons Participating in Visit: Patient.  AWV Questionnaire: No: Patient Medicare AWV questionnaire was not completed prior to this visit.  Cardiac Risk Factors include: advanced age (>65men, >46 women);obesity (BMI >30kg/m2)     Objective:    Today's Vitals   10/03/24 1102  PainSc: 5    There is no height or weight on file to calculate BMI.     10/03/2024   11:08 AM 08/30/2023    5:52 PM 03/16/2023    8:15 AM 12/24/2022   10:23 AM 05/20/2022    3:45 PM 05/20/2022    9:50 AM 05/14/2022    3:03 PM  Advanced Directives  Does Patient Have a Medical Advance Directive? Yes Yes Yes Yes Yes Yes Yes  Type of Estate agent of [place];Living will Living will;Healthcare Power of [employer] Power of [place];Living will Healthcare Power of [employer] of [place];Living will  Healthcare Power of [place];Living will  Does patient want to make changes to medical advance directive? No - Patient declined No - Patient declined   No - Patient declined    Copy of Healthcare Power of Attorney in Chart? Yes - validated most recent copy scanned in chart (See row information)    No - copy requested    Would patient like information on creating a medical advance directive?  No - Patient declined         Current Medications (verified) Outpatient Encounter Medications as of 10/03/2024  Medication Sig   acetaminophen  (TYLENOL ) 500 MG tablet Take 1 tablet (500 mg total) by mouth every 6 (six) hours as needed for mild pain, fever or headache.   folic acid  (FOLVITE ) 1 MG tablet Take 1 mg by mouth daily.   gabapentin  (NEURONTIN ) 400 MG capsule Take 1 capsule (400 mg total) by mouth 3 (three) times daily for 14 days.   ibandronate (BONIVA) 150 MG tablet TAKE 1 TABLET (150 MG TOTAL) BY MOUTH EVERY 30 DAYS WITH A FULL GLASS OF WATER. DO NOT LIE DOWN FOR THE NEXT 60 MIN.   methotrexate  (RHEUMATREX) 2.5 MG tablet Take 2.5 mg by mouth once a week. 4 tablets on Wednesdays   polyethylene glycol (MIRALAX  / GLYCOLAX ) 17 g packet Take 17 g by mouth daily as needed.   Semaglutide-Weight Management 0.5 MG/0.5ML SOAJ Inject 0.5 mg into the skin.   oxyCODONE  (ROXICODONE ) 5 MG immediate release tablet Take 1 tablet (5 mg total) by mouth every 12 (twelve) hours as needed for severe pain (pain score 7-10). (Patient not  taking: Reported on 10/03/2024)   Suzetrigine  (JOURNAVX ) 50 MG TABS Take 1 tablet by mouth 2 (two) times daily as needed. (Patient not taking: Reported on 10/03/2024)   No facility-administered encounter medications on file as of 10/03/2024.    Allergies (verified) Patient has no known allergies.   History: Past Medical History:  Diagnosis Date   Anxiety    Arthritis    Chronic back pain    Depression    Elevated blood pressure reading    GERD (gastroesophageal reflux  disease)    History of hiatal hernia    Hypertension 03/03/2021   Osteopenia 08/01/2020   2019 scan     PONV (postoperative nausea and vomiting)    x 1 with Hysterectomy   PTSD (post-traumatic stress disorder)    Refusal of blood transfusions as patient is Jehovah's Witness    Past Surgical History:  Procedure Laterality Date   ABDOMINAL HYSTERECTOMY     no longer has cervix   CHOLECYSTECTOMY     COLONOSCOPY     LAMINECTOMY  1971   LUMBAR DISC SURGERY  1991   LUMBAR LAMINECTOMY/DECOMPRESSION MICRODISCECTOMY N/A 05/20/2022   Procedure: L2-3 AND L4-5 DECOMPRESSION, RIGHT L3-4 AND L5-S1 MICRODISCECTOMIES;  Surgeon: Clois Fret, MD;  Location: [facility] ORS;  Service: Neurosurgery;  Laterality: N/A;   TONSILLECTOMY     Family History  Problem Relation Age of Onset   Breast cancer Mother 46   Diabetes Mother    Rheum arthritis Mother    Arthritis Father    Gout Father    Alcohol abuse Father    Social History   Socioeconomic History   Marital status: Married    Spouse name: Emily Landry   Number of children: 4   Years of education: high school   Highest education level: Associate degree: occupational, Scientist, product/process development, or vocational program  Occupational History   Not on file  Tobacco Use   Smoking status: Former    Types: Cigarettes   Smokeless tobacco: Never   Tobacco comments:    Smoked for 2 years as a teenager only  Advertising account planner   Vaping status: Never Used  Substance and Sexual Activity   Alcohol use: Yes    Comment: occ   Drug use: Never   Sexual activity: Not Currently  Other Topics Concern   Not on file  Social History Narrative   07/25/20   From: [place] , moved to be near children   Living: with husband, [place] and oldest daughter   Work: retired       Family: Scientist, research (physical sciences) (daughter she lives), Physicist, medical ([place]), Renfrow ([place]), Vina ([place]) - 2 grandchildren - one in [place] and one in [place]      Enjoys: bike, fish, Pharmacist, community      Exercise: walking regularly   Diet: healthy - too  many sweets      Safety   Seat belts: Yes    Guns: No   Safe in relationships: Yes    Social Drivers of Corporate investment banker Strain: Low Risk  (10/03/2024)   Overall Financial Resource Strain (CARDIA)    Difficulty of Paying Living Expenses: Not hard at all  Food Insecurity: No Food Insecurity (10/03/2024)   Hunger Vital Sign    Worried About Running Out of Food in the Last Year: Never true    Ran Out of Food in the Last Year: Never true  Transportation Needs: No Transportation Needs (10/03/2024)   PRAPARE - Administrator, Civil Service (Medical): No  Lack of Transportation (Non-Medical): No  Physical Activity: Insufficiently Active (10/03/2024)   Exercise Vital Sign    Days of Exercise per Week: 2 days    Minutes of Exercise per Session: 30 min  Stress: Stress Concern Present (10/03/2024)   [employer] of Occupational Health - Occupational Stress Questionnaire    Feeling of Stress: To some extent  Social Connections: Moderately Integrated (10/03/2024)   Social Connection and Isolation Panel    Frequency of Communication with Friends and Family: Three times a week    Frequency of Social Gatherings with Friends and Family: Once a week    Attends Religious Services: More than 4 times per year    Active Member of [employer] or Organizations: No    Attends Engineer, structural: Never    Marital Status: Married    Tobacco Counseling Counseling given: Not Answered Tobacco comments: Smoked for 2 years as a teenager only    Clinical Intake:  Pre-visit preparation completed: Yes  Pain : 0-10 Pain Score: 5  Pain Type: Chronic pain Pain Location: Knee Pain Orientation: Right Pain Descriptors / Indicators: Aching, Burning, Constant Pain Onset: More than a month ago Pain Frequency: Constant Pain Relieving Factors: INJECTION/P.T.  Pain Relieving Factors: INJECTION/P.T.  BMI - recorded: 31 Nutritional Status: BMI > 30  Obese Nutritional  Risks: None Diabetes: No  No results found for: HGBA1C   How often do you need to have someone help you when you read instructions, pamphlets, or other written materials from your doctor or pharmacy?: 1 - Never  Interpreter Needed?: No  Information entered by :: JHONNIE DAS, LPN   Activities of Daily Living     10/03/2024   11:10 AM  In your present state of health, do you have any difficulty performing the following activities:  Hearing? 1  Vision? 0  Difficulty concentrating or making decisions? 1  Walking or climbing stairs? 1  Comment KNEE PAIN  Dressing or bathing? 0  Doing errands, shopping? 0  Preparing Food and eating ? N  Using the Toilet? N  In the past six months, have you accidently leaked urine? N  Do you have problems with loss of bowel control? N  Managing your Medications? N  Managing your Finances? N  Housekeeping or managing your Housekeeping? N    Patient Care Team: Donzella Lauraine SAILOR, DO as PCP - General (Family Medicine)  I have updated your Care Teams any recent Medical Services you may have received from other providers in the past year.     Assessment:   This is a routine wellness examination for Melaya.  Hearing/Vision screen Hearing Screening - Comments:: WEARS AIDS, BOTH EARS Vision Screening - Comments:: WEARS GLASSES ALL DAY- Seals- SEEN ONCE PER YEAR   Goals Addressed             This Visit's Progress    DIET - EAT MORE FRUITS AND VEGETABLES         Depression Screen     10/03/2024   11:06 AM 08/23/2024    7:04 AM 06/14/2024    8:43 AM 03/16/2023    8:15 AM 12/24/2022   10:22 AM 12/23/2021    8:07 AM 12/03/2021    9:45 AM  PHQ 2/9 Scores  PHQ - 2 Score 3 6 2  0 0 0 0  PHQ- 9 Score 6 16 9         Fall Risk     10/03/2024   11:10 AM 08/23/2024    7:03  AM 06/14/2024    8:43 AM 03/16/2023    8:15 AM 12/24/2022   10:22 AM  Fall Risk   Falls in the past year? 0 0 0 0 0  Number falls in past yr: 0 0 0    Injury with Fall? 0  0 0    Risk for fall due to : No Fall Risks No Fall Risks No Fall Risks    Follow up Falls evaluation completed;Falls prevention discussed        MEDICARE RISK AT HOME:  Medicare Risk at Home Any stairs in or around the home?: Yes If so, are there any without handrails?: No Home free of loose throw rugs in walkways, pet beds, electrical cords, etc?: Yes Adequate lighting in your home to reduce risk of falls?: Yes Life alert?: No Use of a cane, walker or w/c?: No Grab bars in the bathroom?: No Shower chair or bench in shower?: No Elevated toilet seat or a handicapped toilet?: No  TIMED UP AND GO:  Was the test performed?  No  Cognitive Function: 6CIT completed        10/03/2024   11:11 AM  6CIT Screen  What Year? 0 points  What month? 0 points  What time? 0 points  Count back from 20 0 points  Months in reverse 0 points  Repeat phrase 0 points  Total Score 0 points    Immunizations Immunization History  Administered Date(s) Administered   Fluad Trivalent(High Dose 65+) 09/03/2023   Influenza Inj Mdck Quad Pf 10/07/2021   PFIZER(Purple Top)SARS-COV-2 Vaccination 02/12/2020, 02/29/2020, 03/21/2020    Screening Tests Health Maintenance  Topic Date Due   DTaP/Tdap/Td (1 - Tdap) Never done   Zoster Vaccines- Shingrix (1 of 2) Never done   Colonoscopy  Never done   Pneumococcal Vaccine: 50+ Years (1 of 1 - PCV) Never done   COVID-19 Vaccine (4 - 2025-26 season) 08/14/2024   Influenza Vaccine  03/13/2025 (Originally 07/14/2024)   Medicare Annual Wellness (AWV)  10/03/2025   Mammogram  06/27/2026   DEXA SCAN  Completed   Meningococcal B Vaccine  Aged Out   Hepatitis C Screening  Discontinued    Health Maintenance Items Addressed: NEEDS FLU& SHINGRIX; UTD ON MAMMOGRAM; DECLINES COLONOSCOPY REFERRAL, DECLINES BDS REFERRAL  Additional Screening:  Vision Screening: Recommended annual ophthalmology exams for early detection of glaucoma and other disorders of the  eye. Is the patient up to date with their annual eye exam?  Yes  Who is the provider or what is the name of the office in which the patient attends annual eye exams? [facility]  Dental Screening: Recommended annual dental exams for proper oral hygiene  Community Resource Referral / Chronic Care Management: CRR required this visit?  No   CCM required this visit?  No   Plan:    I have personally reviewed and noted the following in the patient's chart:   Medical and social history Use of alcohol, tobacco or illicit drugs  Current medications and supplements including opioid prescriptions. Patient is not currently taking opioid prescriptions. Functional ability and status Nutritional status Physical activity Advanced directives List of other physicians Hospitalizations, surgeries, and ER visits in previous 12 months Vitals Screenings to include cognitive, depression, and falls Referrals and appointments  In addition, I have reviewed and discussed with patient certain preventive protocols, quality metrics, and best practice recommendations. A written personalized care plan for preventive services as well as general preventive health recommendations were provided to patient.  Jhonnie GORMAN Das, LPN   [id number]   After Visit Summary: (MyChart) Due to this being a telephonic visit, the after visit summary with patients personalized plan was offered to patient via MyChart   Notes: Nothing significant to report at this time.

## 2024-10-03 NOTE — Patient Instructions (Addendum)
 [name],  Thank you for taking the time for your Medicare Wellness Visit. I appreciate your continued commitment to your health goals. Please review the care plan we discussed, and feel free to reach out if I can assist you further.  Medicare recommends these wellness visits once per year to help you and your care team stay ahead of potential health issues. These visits are designed to focus on prevention, allowing your provider to concentrate on managing your acute and chronic conditions during your regular appointments.  Please note that Annual Wellness Visits do not include a physical exam. Some assessments may be limited, especially if the visit was conducted virtually. If needed, we may recommend a separate in-person follow-up with your provider.  Ongoing Care Seeing your primary care provider every 3 to 6 months helps us  monitor your health and provide consistent, personalized care.   Referrals If a referral was made during today's visit and you haven't received any updates within two weeks, please contact the referred provider directly to check on the status.  Recommended Screenings:  Health Maintenance  Topic Date Due   DTaP/Tdap/Td vaccine (1 - Tdap) Never done   Zoster (Shingles) Vaccine (1 of 2) Never done   Colon Cancer Screening  Never done   Pneumococcal Vaccine for age over 75 (1 of 1 - PCV) Never done   COVID-19 Vaccine (4 - 2025-26 season) 08/14/2024   Flu Shot  03/13/2025*   Medicare Annual Wellness Visit  10/03/2025   Breast Cancer Screening  06/27/2026   DEXA scan (bone density measurement)  Completed   Meningitis B Vaccine  Aged Out   Hepatitis C Screening  Discontinued  *Topic was postponed. The date shown is not the original due date.       10/03/2024   11:08 AM  Advanced Directives  Does Patient Have a Medical Advance Directive? Yes  Type of Estate agent of [place];Living will  Does patient want to make changes to medical  advance directive? No - Patient declined  Copy of Healthcare Power of Attorney in Chart? Yes - validated most recent copy scanned in chart (See row information)   Advance Care Planning is important because it: Ensures you receive medical care that aligns with your values, goals, and preferences. Provides guidance to your family and loved ones, reducing the emotional burden of decision-making during critical moments.  Vision: Annual vision screenings are recommended for early detection of glaucoma, cataracts, and diabetic retinopathy. These exams can also reveal signs of chronic conditions such as diabetes and high blood pressure.  Dental: Annual dental screenings help detect early signs of oral cancer, gum disease, and other conditions linked to overall health, including heart disease and diabetes.  Please see the attached documents for additional preventive care recommendations.   NEXT AWV 10/09/25 @ 10:10 AM BY PHONE- SEE YA NEXT YEAR!

## 2024-10-06 ENCOUNTER — Telehealth: Payer: Self-pay | Admitting: Family Medicine

## 2024-10-06 NOTE — Telephone Encounter (Signed)
 [facility] faxed refill request for the following medications:  methotrexate  (RHEUMATREX) 2.5 MG tablet    Please advise.

## 2024-10-09 ENCOUNTER — Other Ambulatory Visit: Payer: Self-pay | Admitting: Family Medicine

## 2024-10-09 ENCOUNTER — Other Ambulatory Visit: Payer: Self-pay

## 2024-10-10 ENCOUNTER — Telehealth: Payer: Self-pay | Admitting: Family Medicine

## 2024-10-10 ENCOUNTER — Other Ambulatory Visit: Payer: Self-pay

## 2024-10-10 NOTE — Telephone Encounter (Signed)
 [facility] faxed refill request for the following medications:  methotrexate  (RHEUMATREX) 2.5 MG tablet    Please advise.

## 2024-10-10 NOTE — Telephone Encounter (Signed)
 Pt needs to contact her Rheumatology dr regarding this medication refill.

## 2024-10-10 NOTE — Telephone Encounter (Signed)
 Please review in provider's absence. Thank you.  LOV- 08/22/2024 NOV- 10/09/2025 LRF- 09/09/2020  methotrexate  (RHEUMATREX) 2.5 MG tablet -- -- 09/09/2020 --   Sig - Route: Take 2.5 mg by mouth once a week. 4 tablets on Wednesdays - Oral   Class: Historical Med

## 2024-10-16 ENCOUNTER — Encounter: Payer: Self-pay | Admitting: Radiology

## 2024-10-18 NOTE — Telephone Encounter (Signed)
 Created in error

## 2024-10-26 ENCOUNTER — Other Ambulatory Visit: Payer: Self-pay | Admitting: Family Medicine

## 2024-10-26 NOTE — Telephone Encounter (Signed)
[facility] faxed refill request for the following medications:   gabapentin (NEURONTIN) 300 MG capsule  Please advise.

## 2024-10-26 NOTE — Telephone Encounter (Signed)
 Duplicate encounter.

## 2024-10-27 ENCOUNTER — Telehealth: Payer: Self-pay

## 2024-10-27 NOTE — Telephone Encounter (Signed)
 Copied from CRM #[id number]. Topic: Clinical - Prescription Issue >> Oct 27, 2024 11:23 AM Terri MATSU wrote: Reason for CRM: Patient is needing a new prescription wrote for gabapentin  (NEURONTIN ) 400 MG capsule; she is all out her medication.

## 2024-10-30 ENCOUNTER — Telehealth: Payer: Self-pay

## 2024-10-30 DIAGNOSIS — G894 Chronic pain syndrome: Secondary | ICD-10-CM

## 2024-10-30 NOTE — Telephone Encounter (Signed)
 Copied from CRM #[id number]. Topic: Clinical - Prescription Issue >> Oct 30, 2024  9:22 AM Charlet HERO wrote: Reason for CRM: gabapentin  (NEURONTIN ) 400 MG capsule there is a open refill request for the med but it shows ended. The patient is stating that it was prescribed by another Dr 2 years ago and would like to have a call back as to why it is not being filled or what she will need to do to get it filled.

## 2024-10-31 ENCOUNTER — Other Ambulatory Visit: Payer: Self-pay

## 2024-10-31 DIAGNOSIS — M0609 Rheumatoid arthritis without rheumatoid factor, multiple sites: Secondary | ICD-10-CM | POA: Diagnosis not present

## 2024-10-31 DIAGNOSIS — M19042 Primary osteoarthritis, left hand: Secondary | ICD-10-CM | POA: Diagnosis not present

## 2024-10-31 DIAGNOSIS — M19041 Primary osteoarthritis, right hand: Secondary | ICD-10-CM | POA: Diagnosis not present

## 2024-10-31 DIAGNOSIS — M11261 Other chondrocalcinosis, right knee: Secondary | ICD-10-CM | POA: Diagnosis not present

## 2024-10-31 DIAGNOSIS — Z79899 Other long term (current) drug therapy: Secondary | ICD-10-CM | POA: Diagnosis not present

## 2024-10-31 NOTE — Telephone Encounter (Signed)
 LOV 08/22/24

## 2024-10-31 NOTE — Telephone Encounter (Signed)
 Converted

## 2024-11-01 ENCOUNTER — Telehealth: Payer: Self-pay

## 2024-11-01 NOTE — Telephone Encounter (Signed)
 This isnt something Dr Gust prescribes. Is this something Neuro can send in for her?

## 2024-11-01 NOTE — Telephone Encounter (Unsigned)
 Copied from CRM #[id number]. Topic: Clinical - Medication Question >> Nov 01, 2024  3:44 PM Anairis L wrote: Reason for CRM: Patient is wanting to know why her gabapentin  (NEURONTIN ) 400 MG capsule can not be refill. She has not seen her orthopedic in years.

## 2024-11-01 NOTE — Telephone Encounter (Signed)
 Patient called and ask if he can call in some Gabapentin  in for her. CB#[phone]

## 2024-11-02 MED ORDER — GABAPENTIN 300 MG PO CAPS: 300.0000 mg | ORAL_CAPSULE | Freq: Every day | ORAL | 0 refills | Status: DC

## 2024-11-02 NOTE — Telephone Encounter (Signed)
 Patient has appt scheduled on 11/08/24.

## 2024-11-02 NOTE — Telephone Encounter (Signed)
 Patient states she has reached out to her PCP in the last few days and they finally told her that they would not be able to give her refill until she is seen. She has an appointment with PCP on 11/08/2024 but she took her last pill yesterday and she is in a lot of pain now and her feet are starting to burn. Anyway we can give her enough until she sees her PCP to discuss taking over the refill?

## 2024-11-02 NOTE — Telephone Encounter (Signed)
 Her last gabapentin  300mg  was filled on 06/22/24 and written for tid.   Did she stop it and recently restart it? How much and how often is she taking? How long has seen been taking it?

## 2024-11-02 NOTE — Addendum Note (Signed)
 Addended by: HILMA HASTINGS on: 11/02/2024 05:03 PM   Modules accepted: Orders

## 2024-11-02 NOTE — Telephone Encounter (Signed)
 Requested medication (s) are due for refill today - unsure  Requested medication (s) are on the active medication list -yes  Future visit scheduled -yes  Last refill: 09/03/23 #42  Notes to clinic: outside provider- sent for review   Requested Prescriptions  Pending Prescriptions Disp Refills   gabapentin  (NEURONTIN ) 400 MG capsule 42 capsule 0    Sig: Take 1 capsule (400 mg total) by mouth 3 (three) times daily for 14 days.     Neurology: Anticonvulsants - gabapentin  Passed - 11/02/2024  2:43 PM      Passed - Cr in normal range and within 360 days    Creatinine, Ser  Date Value Ref Range Status  08/22/2024 0.94 0.57 - 1.00 mg/dL Final         Passed - Completed PHQ-2 or PHQ-9 in the last 360 days      Passed - Valid encounter within last 12 months    Recent Outpatient Visits           1 month ago Insomnia, unspecified type   [facility] [place], Lauraine SAILOR, DO   2 months ago Chronic fatigue   [facility] [place], Lauraine SAILOR, DO   4 months ago Chronic pain syndrome   [place] [facility] [place], Lauraine SAILOR, DO                 Requested Prescriptions  Pending Prescriptions Disp Refills   gabapentin  (NEURONTIN ) 400 MG capsule 42 capsule 0    Sig: Take 1 capsule (400 mg total) by mouth 3 (three) times daily for 14 days.     Neurology: Anticonvulsants - gabapentin  Passed - 11/02/2024  2:43 PM      Passed - Cr in normal range and within 360 days    Creatinine, Ser  Date Value Ref Range Status  08/22/2024 0.94 0.57 - 1.00 mg/dL Final         Passed - Completed PHQ-2 or PHQ-9 in the last 360 days      Passed - Valid encounter within last 12 months    Recent Outpatient Visits           1 month ago Insomnia, unspecified type   [facility] [place], Lauraine SAILOR, DO   2 months ago Chronic fatigue   [facility] [place], Lauraine SAILOR, DO   4 months ago Chronic  pain syndrome   [facility] [place], Lauraine SAILOR, DO

## 2024-11-02 NOTE — Telephone Encounter (Signed)
 She does not have f/u scheduled with me. I recommend she get neurontin  from her PCP.

## 2024-11-02 NOTE — Addendum Note (Signed)
 Addended by: RAYNALDO CLOTILDA SAILOR on: 11/02/2024 02:31 PM   Modules accepted: Orders

## 2024-11-02 NOTE — Telephone Encounter (Signed)
 I can refill her neurontin  300mg  to take once a day- I recommend taking at night- until she sees her PCP. She will need to get further refills from her PCP. I gave her a 10 day supply.   Script sent to pharmacy.

## 2024-11-03 NOTE — Telephone Encounter (Signed)
 Patient advised.

## 2024-11-03 NOTE — Addendum Note (Signed)
 Addended by: CHERRY CHIQUITA HERO on: 11/03/2024 08:25 AM   Modules accepted: Orders

## 2024-11-06 MED ORDER — GABAPENTIN 300 MG PO CAPS: 300.0000 mg | ORAL_CAPSULE | Freq: Three times a day (TID) | ORAL | 3 refills | Status: AC | PRN

## 2024-11-06 NOTE — Addendum Note (Signed)
 Addended by: DONZELLA DOMINO on: 11/06/2024 01:04 PM   Modules accepted: Orders

## 2024-11-06 NOTE — Telephone Encounter (Signed)
 LVMTCB. OK to advise per provider if call is returned

## 2024-11-08 ENCOUNTER — Encounter: Payer: Self-pay | Admitting: Family Medicine

## 2024-11-08 ENCOUNTER — Ambulatory Visit: Admitting: Family Medicine

## 2024-11-08 VITALS — BP 137/83 | HR 72 | Temp 98.2°F | Ht 67.0 in | Wt 193.7 lb

## 2024-11-08 DIAGNOSIS — G4700 Insomnia, unspecified: Secondary | ICD-10-CM

## 2024-11-08 DIAGNOSIS — M11261 Other chondrocalcinosis, right knee: Secondary | ICD-10-CM

## 2024-11-08 DIAGNOSIS — M0600 Rheumatoid arthritis without rheumatoid factor, unspecified site: Secondary | ICD-10-CM

## 2024-11-08 DIAGNOSIS — M810 Age-related osteoporosis without current pathological fracture: Secondary | ICD-10-CM

## 2024-11-08 DIAGNOSIS — G894 Chronic pain syndrome: Secondary | ICD-10-CM

## 2024-11-08 DIAGNOSIS — E66811 Obesity, class 1: Secondary | ICD-10-CM

## 2024-11-08 NOTE — Progress Notes (Signed)
 "     Established patient visit   Patient: Emily Landry   DOB: [birth year]   73 y.o. Female  MRN: [id number] Visit Date: 11/08/2024  Today's healthcare provider: LAURAINE LOISE BUOY, DO   Chief Complaint  Patient presents with   Medication Refill    Patient is here today to discuss getting medication refills from provider, states that she has not ever filled it for before.    Declined vaccines and colonoscopy   Subjective    HPI Emily Landry is a 73 year old female with chronic pain syndrome and rheumatoid arthritis who presents for medication management and follow-up.  She experiences ongoing sleep disturbances. Gabapentin  aids her sleep, while prednisone causes her to feel 'wired.' A rare good night's sleep occurred when she accidentally missed her medications, leading her to suspect vitamin D3 might interfere with her sleep. She uses niacinamide and occasionally magnesium  to aid sleep and has a history of using antihistamines like Benadryl for sleep issues.  She takes gabapentin  300 mg once at night, which helps manage her chronic pain syndrome. Attempts to wean off gabapentin  resulted in 'feet on fire' sensations, necessitating its continued use. Although gabapentin  makes her feel 'stupid,' she considers it a necessary compromise for pain management.  She has a history of pseudogout in her knee, treated with colchicine and prednisone. The condition has improved, but she is concerned about recurrence after stopping prednisone. Initially, she thought the knee pain was due to an injury but was diagnosed with pseudogout after an x-ray showed calcium crystals.  She has been on ibandronate for bone health, taking it once a month, and stopped raloxifene about two to three weeks ago. She also takes MSM (methylsulfonylmethane) for bone, muscle, and nerve issues.  She has a history of back surgery and chronic pain management due to a back condition and a fall that resulted in multiple fractures  on her left side.  She is currently on semaglutide [facility]) but plans to switch to tirzepatide due to weight gain from prednisone.  She had a colonoscopy approximately six to eight years ago, during which a polyp was removed. She is due for another colonoscopy but has been delaying it due to caregiving responsibilities for her husband.       Medications: Outpatient Medications Prior to Visit  Medication Sig Note   acetaminophen  (TYLENOL ) 500 MG tablet Take 1 tablet (500 mg total) by mouth every 6 (six) hours as needed for mild pain, fever or headache.    colchicine 0.6 MG tablet Take 0.6 mg by mouth 2 (two) times daily.    folic acid  (FOLVITE ) 1 MG tablet Take 1 mg by mouth daily.    gabapentin  (NEURONTIN ) 300 MG capsule Take 1 capsule (300 mg total) by mouth 3 (three) times daily as needed (pain).    ibandronate (BONIVA) 150 MG tablet TAKE 1 TABLET (150 MG TOTAL) BY MOUTH EVERY 30 DAYS WITH A FULL GLASS OF WATER. DO NOT LIE DOWN FOR THE NEXT 60 MIN.    methotrexate  (RHEUMATREX) 2.5 MG tablet Take 2.5 mg by mouth once a week. 4 tablets on Wednesdays    polyethylene glycol (MIRALAX  / GLYCOLAX ) 17 g packet Take 17 g by mouth daily as needed.    [EXPIRED] predniSONE (DELTASONE) 10 MG tablet Take by mouth. Take 4 tablets (40 mg total) by mouth once daily for 3 days, THEN 3 tablets (30 mg total) once daily for 3 days, THEN 2 tablets (20 mg total) once daily for 3 days,  THEN 1 tablet (10 mg total) once daily for 3 days.    [DISCONTINUED] Semaglutide-Weight Management 0.5 MG/0.5ML SOAJ Inject 0.5 mg into the skin. 11/08/2024: to tirzepatide   Biotin 1 MG CAPS Take 1 mg by mouth daily.    Methylsulfonylmethane 1000 MG CAPS Take 1,000 mg by mouth daily.    Niacinamide 500 MG CAPS Take 500 capsules by mouth at bedtime.    No facility-administered medications prior to visit.        Objective    BP 137/83 (BP Location: Left Arm, Patient Position: Sitting, Cuff Size: Normal)   Pulse 72   Temp  98.2 F (36.8 C) (Oral)   Ht 5' 7 (1.702 m)   Wt 193 lb 11.2 oz (87.9 kg)   SpO2 99%   BMI 30.34 kg/m     Physical Exam Constitutional:      Appearance: Normal appearance.  HENT:     Head: Normocephalic and atraumatic.  Eyes:     General: No scleral icterus.    Extraocular Movements: Extraocular movements intact.     Conjunctiva/sclera: Conjunctivae normal.  Cardiovascular:     Rate and Rhythm: Normal rate and regular rhythm.     Pulses: Normal pulses.     Heart sounds: Normal heart sounds.  Pulmonary:     Effort: Pulmonary effort is normal. No respiratory distress.     Breath sounds: Normal breath sounds.  Musculoskeletal:     Right lower leg: No edema.     Left lower leg: No edema.  Skin:    General: Skin is warm and dry.  Neurological:     Mental Status: She is alert and oriented to person, place, and time. Mental status is at baseline.  Psychiatric:        Mood and Affect: Mood normal.        Behavior: Behavior normal.      No results found for any visits on 11/08/24.  Assessment & Plan    Chronic pain syndrome Assessment & Plan: Chronic pain syndrome secondary to rheumatoid arthritis, previous fall with left-sided injury, and prior back surgery. Gabapentin  effective but concerns about cognitive effects. Pain increased when gabapentin  was stopped, necessitating continuation. - Continue gabapentin  300 mg at night.     Seronegative rheumatoid arthritis (HCC) Assessment & Plan: No acute concerns.   - Continue home methotrexate  - Continue gabapentin  - Follows with rheumatology; defer to specialist management.   Pseudogout of right knee Assessment & Plan: Pseudogout confirmed by rheumatologist. Symptoms improved with prednisone and colchicine. Concerns about symptom recurrence post-prednisone. - Continue colchicine as prescribed. - Monitor symptoms after stopping prednisone.   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 Currently not taking raloxifene due to redundancy. - Continue ibandronate monthly.   Insomnia, unspecified type Assessment & Plan: Chronic insomnia possibly worsened by prednisone. Previous treatments ineffective.  Currently using magnesium  and niacinamide for sleep, with good effects.  No changes today.   Obesity (BMI 30.0-34.9) Assessment & Plan: Weight gain linked to prednisone. Transitioning from semaglutide to tirzepatide. Discussed dietary management and future tirzepatide tapering. - Transition to tirzepatide for weight management. - Monitor dietary intake and caloric restriction. - Plan for potential tapering of tirzepatide in the future.       Return for TOC w/Dr. Simmons-Robinson.      I discussed the assessment and treatment plan with the patient  The patient was provided an opportunity to ask questions and all were answered. The patient  agreed with the plan and demonstrated an understanding of the instructions.   The patient was advised to call back or seek an in-person evaluation if the symptoms worsen or if the condition fails to improve as anticipated.    LAURAINE LOISE BUOY, DO  [facility], [facility] [phone] (phone) [phone] (fax)  [facility] "

## 2024-11-08 NOTE — Assessment & Plan Note (Addendum)
 No acute concerns.   - Continue home methotrexate  - Continue gabapentin  - Follows with rheumatology; defer to specialist management.

## 2024-11-08 NOTE — Assessment & Plan Note (Addendum)
 Chronic pain syndrome secondary to rheumatoid arthritis, previous fall with left-sided injury, and prior back surgery. Gabapentin  effective but concerns about cognitive effects. Pain increased when gabapentin  was stopped, necessitating continuation. - Continue gabapentin  300 mg at night.

## 2024-11-08 NOTE — Patient Instructions (Addendum)
 Please contact to schedule colonoscopy: [facility] at [facility] [address], [place], [place] [address] Phone: [phone]

## 2024-11-13 ENCOUNTER — Ambulatory Visit: Admitting: Orthopedic Surgery

## 2024-11-27 ENCOUNTER — Encounter: Payer: Self-pay | Admitting: Family Medicine

## 2024-11-27 DIAGNOSIS — M810 Age-related osteoporosis without current pathological fracture: Secondary | ICD-10-CM

## 2024-12-01 DIAGNOSIS — M11261 Other chondrocalcinosis, right knee: Secondary | ICD-10-CM | POA: Insufficient documentation

## 2024-12-01 DIAGNOSIS — E66811 Obesity, class 1: Secondary | ICD-10-CM | POA: Insufficient documentation

## 2024-12-01 NOTE — Assessment & Plan Note (Signed)
 Chronic insomnia possibly worsened by prednisone. Previous treatments ineffective.  Currently using magnesium  and niacinamide for sleep, with good effects.  No changes today.

## 2024-12-01 NOTE — Assessment & Plan Note (Signed)
 Pseudogout confirmed by rheumatologist. Symptoms improved with prednisone and colchicine. Concerns about symptom recurrence post-prednisone. - Continue colchicine as prescribed. - Monitor symptoms after stopping prednisone.

## 2024-12-01 NOTE — Assessment & Plan Note (Signed)
 Previously on raloxifene and ibandronate. Currently not taking raloxifene due to redundancy. - Continue ibandronate monthly.

## 2024-12-01 NOTE — Assessment & Plan Note (Signed)
 Weight gain linked to prednisone. Transitioning from semaglutide to tirzepatide. Discussed dietary management and future tirzepatide tapering. - Transition to tirzepatide for weight management. - Monitor dietary intake and caloric restriction. - Plan for potential tapering of tirzepatide in the future.

## 2024-12-29 ENCOUNTER — Encounter: Payer: Self-pay | Admitting: Family Medicine

## 2025-10-09 ENCOUNTER — Ambulatory Visit
